# Patient Record
Sex: Female | Born: 1980 | Race: White | Hispanic: No | Smoking: Never smoker
Health system: Southern US, Community
[De-identification: ages and names within clinical notes are randomized; demographics above are authoritative.]

## PROBLEM LIST (undated history)

## (undated) DIAGNOSIS — Z8742 Personal history of other diseases of the female genital tract: Secondary | ICD-10-CM

## (undated) DIAGNOSIS — Z87410 Personal history of cervical dysplasia: Secondary | ICD-10-CM

## (undated) DIAGNOSIS — E119 Type 2 diabetes mellitus without complications: Secondary | ICD-10-CM

## (undated) DIAGNOSIS — N938 Other specified abnormal uterine and vaginal bleeding: Secondary | ICD-10-CM

## (undated) DIAGNOSIS — N8003 Adenomyosis of the uterus: Secondary | ICD-10-CM

## (undated) DIAGNOSIS — Z8639 Personal history of other endocrine, nutritional and metabolic disease: Secondary | ICD-10-CM

## (undated) DIAGNOSIS — R102 Pelvic and perineal pain unspecified side: Secondary | ICD-10-CM

## (undated) DIAGNOSIS — K760 Fatty (change of) liver, not elsewhere classified: Secondary | ICD-10-CM

## (undated) DIAGNOSIS — N946 Dysmenorrhea, unspecified: Secondary | ICD-10-CM

## (undated) DIAGNOSIS — Z973 Presence of spectacles and contact lenses: Secondary | ICD-10-CM

## (undated) DIAGNOSIS — N921 Excessive and frequent menstruation with irregular cycle: Secondary | ICD-10-CM

## (undated) DIAGNOSIS — J302 Other seasonal allergic rhinitis: Secondary | ICD-10-CM

## (undated) DIAGNOSIS — D649 Anemia, unspecified: Secondary | ICD-10-CM

## (undated) HISTORY — DX: Personal history of other endocrine, nutritional and metabolic disease: Z86.39

## (undated) HISTORY — DX: Other seasonal allergic rhinitis: J30.2

## (undated) HISTORY — PX: TUBAL LIGATION: SHX77

## (undated) HISTORY — DX: Fatty (change of) liver, not elsewhere classified: K76.0

## (undated) HISTORY — DX: Anemia, unspecified: D64.9

---

## 2009-12-12 ENCOUNTER — Ambulatory Visit: Payer: Self-pay | Admitting: Obstetrics and Gynecology

## 2009-12-12 ENCOUNTER — Encounter: Payer: Self-pay | Admitting: Obstetrics & Gynecology

## 2009-12-12 LAB — CONVERTED CEMR LAB
Antibody Screen: NEGATIVE
Basophils Relative: 1 % (ref 0–1)
HCT: 40.5 % (ref 36.0–46.0)
Hepatitis B Surface Ag: NEGATIVE
Lymphocytes Relative: 21 % (ref 12–46)
MCHC: 34.6 g/dL (ref 30.0–36.0)
Monocytes Relative: 8 % (ref 3–12)
RBC: 4.56 M/uL (ref 3.87–5.11)
RDW: 13 % (ref 11.5–15.5)
Rh Type: POSITIVE
WBC: 7.7 10*3/uL (ref 4.0–10.5)

## 2009-12-27 ENCOUNTER — Ambulatory Visit (HOSPITAL_COMMUNITY): Admission: RE | Admit: 2009-12-27 | Discharge: 2009-12-27 | Payer: Self-pay | Admitting: Obstetrics and Gynecology

## 2010-01-22 ENCOUNTER — Ambulatory Visit: Payer: Self-pay | Admitting: Obstetrics and Gynecology

## 2010-01-25 ENCOUNTER — Ambulatory Visit (HOSPITAL_COMMUNITY): Admission: RE | Admit: 2010-01-25 | Discharge: 2010-01-25 | Payer: Self-pay | Admitting: Obstetrics & Gynecology

## 2010-02-13 ENCOUNTER — Ambulatory Visit (HOSPITAL_COMMUNITY): Admission: RE | Admit: 2010-02-13 | Discharge: 2010-02-13 | Payer: Self-pay | Admitting: Obstetrics & Gynecology

## 2010-02-19 ENCOUNTER — Ambulatory Visit: Payer: Self-pay | Admitting: Obstetrics & Gynecology

## 2011-02-03 ENCOUNTER — Ambulatory Visit: Payer: Self-pay | Admitting: Family Medicine

## 2011-03-14 ENCOUNTER — Encounter: Payer: Self-pay | Admitting: Family Medicine

## 2011-03-14 ENCOUNTER — Ambulatory Visit (INDEPENDENT_AMBULATORY_CARE_PROVIDER_SITE_OTHER): Payer: Federal, State, Local not specified - PPO | Admitting: Family Medicine

## 2011-03-14 ENCOUNTER — Other Ambulatory Visit: Payer: Self-pay | Admitting: Family Medicine

## 2011-03-14 DIAGNOSIS — Z8742 Personal history of other diseases of the female genital tract: Secondary | ICD-10-CM

## 2011-03-14 DIAGNOSIS — E669 Obesity, unspecified: Secondary | ICD-10-CM | POA: Insufficient documentation

## 2011-03-14 DIAGNOSIS — Z862 Personal history of diseases of the blood and blood-forming organs and certain disorders involving the immune mechanism: Secondary | ICD-10-CM | POA: Insufficient documentation

## 2011-03-14 DIAGNOSIS — E663 Overweight: Secondary | ICD-10-CM

## 2011-03-14 DIAGNOSIS — Z0000 Encounter for general adult medical examination without abnormal findings: Secondary | ICD-10-CM

## 2011-03-14 HISTORY — DX: Personal history of diseases of the blood and blood-forming organs and certain disorders involving the immune mechanism: Z86.2

## 2011-03-14 HISTORY — DX: Personal history of other diseases of the female genital tract: Z87.42

## 2011-03-14 LAB — BASIC METABOLIC PANEL
Calcium: 9 mg/dL (ref 8.4–10.5)
Glucose, Bld: 103 mg/dL — ABNORMAL HIGH (ref 70–99)
Potassium: 4.3 mEq/L (ref 3.5–5.1)

## 2011-03-14 LAB — HEPATIC FUNCTION PANEL
Albumin: 4.4 g/dL (ref 3.5–5.2)
Alkaline Phosphatase: 36 U/L — ABNORMAL LOW (ref 39–117)
Total Bilirubin: 0.8 mg/dL (ref 0.3–1.2)
Total Protein: 7.2 g/dL (ref 6.0–8.3)

## 2011-03-14 LAB — CBC WITH DIFFERENTIAL/PLATELET
Basophils Absolute: 0 10*3/uL (ref 0.0–0.1)
HCT: 40.1 % (ref 36.0–46.0)
Lymphocytes Relative: 29.9 % (ref 12.0–46.0)
Lymphs Abs: 1.6 10*3/uL (ref 0.7–4.0)
Monocytes Absolute: 0.5 10*3/uL (ref 0.1–1.0)
Monocytes Relative: 8.3 % (ref 3.0–12.0)
Neutrophils Relative %: 57.8 % (ref 43.0–77.0)
Platelets: 257 10*3/uL (ref 150.0–400.0)
WBC: 5.5 10*3/uL (ref 4.5–10.5)

## 2011-03-14 LAB — LIPID PANEL
HDL: 44 mg/dL (ref >39.00)
LDL Cholesterol: 116 mg/dL — ABNORMAL HIGH (ref 0–99)

## 2011-03-16 DIAGNOSIS — R740 Nonspecific elevation of levels of transaminase and lactic acid dehydrogenase [LDH]: Secondary | ICD-10-CM

## 2011-03-16 DIAGNOSIS — R7309 Other abnormal glucose: Secondary | ICD-10-CM | POA: Insufficient documentation

## 2011-03-16 DIAGNOSIS — IMO0002 Reserved for concepts with insufficient information to code with codable children: Secondary | ICD-10-CM | POA: Insufficient documentation

## 2011-03-16 DIAGNOSIS — R7401 Elevation of levels of liver transaminase levels: Secondary | ICD-10-CM | POA: Insufficient documentation

## 2011-03-18 NOTE — Assessment & Plan Note (Signed)
Summary: NEW PATIENT TO EST/CPX/CLE  BCBS,MAILED NPP   Vital Signs:  Patient profile:   30 year old female Height:      64 inches Weight:      207.25 pounds BMI:     35.70 Temp:     98.3 degrees F oral Pulse rate:   76 / minute Pulse rhythm:   regular BP sitting:   118 / 78  (left arm) Cuff size:   large  Vitals Entered By: Selena Batten Dance CMA Duncan Dull) (March 14, 2011 9:36 AM) CC: New patient to establish care   History of Present Illness: CC: new patient, establish  wants to lose weight.  currently nursing.  currently trying to eat more healthy - incorporating more raw vegetables and fruits, less butter.  trying to exercise more as well - evenings 3 nights/wk.  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  may have had in hospital, will call and check.   well woman at [facility].  always had normal paps/breast.  Previously saw [facility].  Hasn't had complete physical in 3 years.  Due for one.   last blood work 3 years ago.  Current Medications (verified): 1)  Prenatal/folic Acid  Tabs (Prenatal Vit-Fe Fumarate-Fa) .Marland Kitchen.. 1 By Mouth Once Daily 2)  Calcium Antacid 500 Mg Chew (Calcium Carbonate Antacid) .... Two Daily  Allergies (verified): No Known Drug Allergies  Past History:  Past Medical History: BMI 35 h/o GDM during 2nd pregnancy h/o anemia after 2nd pregnancy h/o chicken pox  Past Surgical History: C/S x 2 (2009, 2011)  Family History: F: DM, HTN, HLD M: HTN, HLD, skin cancer MGF: heart disease, MI PGM: MI  No CVA, other CA  Social History: no smoking, no EtOH, no rec drugs caffeine: none Occupation: stay at home mom Lives with husband and 2 children (2009, 2011), 2 dogs Edu: HS, culinary school  Review of Systems  The patient denies anorexia, fever, weight loss, weight gain, vision loss, decreased hearing, hoarseness, chest pain, syncope, dyspnea on exertion, peripheral edema, prolonged cough, headaches, hemoptysis,  abdominal pain, melena, hematochezia, severe indigestion/heartburn, hematuria, depression, and breast masses.    Physical Exam  General:  Well-developed,well-nourished,in no acute distress; alert,appropriate and cooperative throughout examination Head:  Normocephalic and atraumatic without obvious abnormalities. No apparent alopecia or balding. Eyes:  No corneal or conjunctival inflammation noted. EOMI. Perrla.  Ears:  TMs clear bilaterally Nose:  nares clear bilaterally Mouth:  no pharyngeal erythema/exudates, mmm, good dentition Neck:  No deformities, masses, or tenderness noted.  no LAD Lungs:  Normal respiratory effort, chest expands symmetrically. Lungs are clear to auscultation, no crackles or wheezes.  some distant bs Heart:  Normal rate and regular rhythm. S1 and S2 normal without gallop, murmur, click, rub or other extra sounds. Abdomen:  Bowel sounds positive,abdomen soft and non-tender without masses, organomegaly or hernias noted. Msk:  No deformity or scoliosis noted of thoracic or lumbar spine.   Pulses:  2+ rad pulses, brisk cap refill Extremities:  no pedal edema Neurologic:  CN grossly intact, station and gait intact. Skin:  Intact without suspicious lesions or rashes Psych:  full affect , pleasant and cooperative with exam.   Impression & Recommendations:  Problem # 1:  HEALTH MAINTENANCE EXAM (ICD-V70.0) discussed preventative measures, to call about latest shot at [facility] hospital.  Orders: Venipuncture (16109) TLB-Lipid Panel (80061-LIPID) TLB-BMP (Basic Metabolic Panel-BMET) (80048-METABOL) TLB-CBC Platelet - w/Differential (85025-CBCD) TLB-Hepatic/Liver Function Pnl (80076-HEPATIC) TLB-TSH (Thyroid Stimulating Hormone) (  84443-TSH)  Problem # 2:  OVERWEIGHT (ICD-278.02) Assessment: New discussed healthy eating, incorporating exercise into routine. Orders: TLB-Lipid Panel (80061-LIPID)  Ht: 64 (03/14/2011)   Wt: 207.25 (03/14/2011)   BMI: 35.70  (03/14/2011)  Problem # 3:  DIABETES MELLITUS, GESTATIONAL, HX OF (ICD-V13.29) check BMP.  Problem # 4:  ANEMIA, HX OF (ICD-V12.3) check CBC. Orders: TLB-CBC Platelet - w/Differential (85025-CBCD)  Complete Medication List: 1)  Prenatal/folic Acid Tabs (Prenatal vit-fe fumarate-fa) .Marland Kitchen.. 1 by mouth once daily 2)  Calcium Antacid 500 Mg Chew (Calcium carbonate antacid) .... Two daily  Patient Instructions: 1)  Call [facility] to ask about immunization. 2)  recommend tetanus every 10 years. 3)  Return as needed or in 1-2 years for next physical 4)  good to see you today, call clinic with questions   Orders Added: 1)  Venipuncture [36415] 2)  TLB-Lipid Panel [80061-LIPID] 3)  TLB-BMP (Basic Metabolic Panel-BMET) [80048-METABOL] 4)  TLB-CBC Platelet - w/Differential [85025-CBCD] 5)  TLB-Hepatic/Liver Function Pnl [80076-HEPATIC] 6)  TLB-TSH (Thyroid Stimulating Hormone) [84443-TSH] 7)  New Patient 18-39 years [99385]    Current Allergies (reviewed today): No known allergies

## 2011-09-08 ENCOUNTER — Other Ambulatory Visit: Payer: Federal, State, Local not specified - PPO

## 2011-10-14 ENCOUNTER — Other Ambulatory Visit: Payer: Federal, State, Local not specified - PPO

## 2011-12-30 HISTORY — PX: LAPAROSCOPIC TUBAL LIGATION: SUR803

## 2012-09-10 ENCOUNTER — Ambulatory Visit (INDEPENDENT_AMBULATORY_CARE_PROVIDER_SITE_OTHER): Payer: Federal, State, Local not specified - PPO | Admitting: Medical

## 2012-09-10 ENCOUNTER — Encounter: Payer: Self-pay | Admitting: Medical

## 2012-09-10 VITALS — BP 108/80 | HR 72 | Temp 98.4°F | Resp 16 | Wt 211.0 lb

## 2012-09-10 DIAGNOSIS — F32A Depression, unspecified: Secondary | ICD-10-CM

## 2012-09-10 DIAGNOSIS — F3289 Other specified depressive episodes: Secondary | ICD-10-CM

## 2012-09-10 DIAGNOSIS — F329 Major depressive disorder, single episode, unspecified: Secondary | ICD-10-CM

## 2012-09-10 MED ORDER — SERTRALINE HCL 25 MG PO TABS: ORAL_TABLET | ORAL | Status: DC
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
## 2012-09-10 NOTE — Patient Instructions (Signed)
[facility] [address] [place] [phone]   [facility] [address], [place], [place] [address]  [phone]    [facility] [address] Ginette Otto [phone]

## 2012-09-28 ENCOUNTER — Ambulatory Visit: Payer: Federal, State, Local not specified - PPO | Admitting: Medical

## 2012-10-05 ENCOUNTER — Telehealth: Payer: Self-pay | Admitting: Medical

## 2012-10-05 ENCOUNTER — Ambulatory Visit: Payer: Federal, State, Local not specified - PPO | Admitting: Medical

## 2012-10-05 NOTE — Telephone Encounter (Signed)
LM

## 2012-10-08 ENCOUNTER — Ambulatory Visit (INDEPENDENT_AMBULATORY_CARE_PROVIDER_SITE_OTHER): Payer: Federal, State, Local not specified - PPO | Admitting: Medical

## 2012-10-08 ENCOUNTER — Encounter: Payer: Self-pay | Admitting: Medical

## 2012-10-08 VITALS — BP 120/78 | HR 62 | Wt 214.0 lb

## 2012-10-08 DIAGNOSIS — Z23 Encounter for immunization: Secondary | ICD-10-CM

## 2012-10-08 DIAGNOSIS — F329 Major depressive disorder, single episode, unspecified: Secondary | ICD-10-CM

## 2012-10-08 DIAGNOSIS — F3289 Other specified depressive episodes: Secondary | ICD-10-CM

## 2012-10-08 DIAGNOSIS — F32A Depression, unspecified: Secondary | ICD-10-CM
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
## 2012-12-29 DIAGNOSIS — K760 Fatty (change of) liver, not elsewhere classified: Secondary | ICD-10-CM

## 2012-12-29 HISTORY — DX: Fatty (change of) liver, not elsewhere classified: K76.0

## 2013-01-03 ENCOUNTER — Encounter: Payer: Federal, State, Local not specified - PPO | Admitting: Medical

## 2013-01-21 LAB — COMPREHENSIVE METABOLIC PANEL
AST: 62 U/L
Alkaline Phosphatase: 45 U/L
Creat: 0.73
Glucose: 91
Total Bilirubin: 0.7 mg/dL

## 2013-01-31 ENCOUNTER — Ambulatory Visit (INDEPENDENT_AMBULATORY_CARE_PROVIDER_SITE_OTHER): Payer: Federal, State, Local not specified - PPO | Admitting: Family Medicine

## 2013-01-31 ENCOUNTER — Encounter: Payer: Self-pay | Admitting: Family Medicine

## 2013-01-31 VITALS — BP 124/84 | HR 84 | Temp 98.3°F | Ht 64.0 in | Wt 216.0 lb

## 2013-01-31 DIAGNOSIS — F4320 Adjustment disorder, unspecified: Secondary | ICD-10-CM | POA: Insufficient documentation

## 2013-01-31 DIAGNOSIS — E663 Overweight: Secondary | ICD-10-CM

## 2013-01-31 DIAGNOSIS — R7401 Elevation of levels of liver transaminase levels: Secondary | ICD-10-CM

## 2013-01-31 NOTE — Assessment & Plan Note (Signed)
Discussed healthy diet and lifestyle changes to affect weight loss. Body mass index is 37.08 kg/(m^2).

## 2013-01-31 NOTE — Patient Instructions (Addendum)
Return in 1-2 months for labwork to recheck liver function. Return as needed or in 1 year for physical. Work on diet and try to incorporate exercise into routine - start out at 15 min 3 times a week, goal 30-45 min 5+ days/wk.  Fatty Liver Fatty liver is the accumulation of fat in liver cells. It is also called hepatosteatosis or steatohepatitis. It is normal for your liver to contain some fat. If fat is more than 5 to 10% of your liver's weight, you have fatty liver.  There are often no symptoms (problems) for years while damage is still occurring. People often learn about their fatty liver when they have medical tests for other reasons. Fat can damage your liver for years or even decades without causing problems. When it becomes severe, it can cause fatigue, weight loss, weakness, and confusion. This makes you more likely to develop more serious liver problems. The liver is the largest organ in the body. It does a lot of work and often gives no warning signs when it is sick until late in a disease. The liver has many important jobs including:  Breaking down foods.  Storing vitamins, iron, and other minerals.  Making proteins.  Making bile for food digestion.  Breaking down many products including medications, alcohol and some poisons. CAUSES  There are a number of different conditions, medications, and poisons that can cause a fatty liver. Eating too many calories causes fat to build up in the liver. Not processing and breaking fats down normally may also cause this. Certain conditions, such as obesity, diabetes, and high triglycerides also cause this. Most fatty liver patients tend to be middle-aged and over weight.  Some causes of fatty liver are:  Alcohol over consumption.  Malnutrition.  Steroid use.  Valproic acid toxicity.  Obesity.  Cushing's syndrome.  Poisons.  Tetracycline in high dosages.  Pregnancy.  Diabetes.  Hyperlipidemia.  Rapid weight loss. Some  people develop fatty liver even having none of these conditions. SYMPTOMS  Fatty liver most often causes no problems. This is called asymptomatic.  It can be diagnosed with blood tests and also by a liver biopsy.  It is one of the most common causes of minor elevations of liver enzymes on routine blood tests.  Specialized Imaging of the liver using ultrasound, CT (computed tomography) scan, or MRI (magnetic resonance imaging) can suggest a fatty liver but a biopsy is needed to confirm it.  A biopsy involves taking a small sample of liver tissue. This is done by using a needle. It is then looked at under a microscope by a specialist. TREATMENT  It is important to treat the cause. Simple fatty liver without a medical reason may not need treatment.  Weight loss, fat restriction, and exercise in overweight patients produces inconsistent results but is worth trying.  Fatty liver due to alcohol toxicity may not improve even with stopping drinking.  Good control of diabetes may reduce fatty liver.  Lower your triglycerides through diet, medication or both.  Eat a balanced, healthy diet.  Increase your physical activity.  Get regular checkups from a liver specialist.  There are no medical or surgical treatments for a fatty liver or NASH, but improving your diet and increasing your exercise may help prevent or reverse some of the damage. PROGNOSIS  Fatty liver may cause no damage or it can lead to an inflammation of the liver. This is, called steatohepatitis. When it is linked to alcohol abuse, it is called alcoholic steatohepatitis.  It often is not linked to alcohol. It is then called nonalcoholic steatohepatitis, or NASH. Over time the liver may become scarred and hardened. This condition is called cirrhosis. Cirrhosis is serious and may lead to liver failure or cancer. NASH is one of the leading causes of cirrhosis. About 10-20% of Americans have fatty liver and a smaller 2-5% has  NASH. Document Released: 01/30/2006 Document Revised: 03/08/2012 Document Reviewed: 03/25/2006 [facility] Patient Information 2013 [place], [place].

## 2013-01-31 NOTE — Assessment & Plan Note (Signed)
Self tapered off zoloft. Feels stable off this med. Continues to f/u with counselor.

## 2013-01-31 NOTE — Progress Notes (Signed)
  Subjective:    Patient ID: Linda Carlson, female    DOB: 04-Jun-1981, 32 y.o.   MRN: [id number]  HPI CC: transaminitis  Seen 01/21/2013 at [facility] for acute visit with HA, dizziness, nausea/vomiting with LLQ abd pain, dx with sinusitis, ear infection and vertigo/neuritis after URI - records reviewed.  Found to have transaminitis with AST 62, ALT 134, CT abd/pelvis with hepatomegaly suggestive of fatty liver.  Treated with 5 different meds.  Treated with phenergan, meclizine, flonase and 2 antibiotics.  Feeling better, still occasional nausea with HA and significant anxiety.  No EtOH, rare tylenol.  advil 600mg  about 2-3 x/wk.  No fmhx liver trouble.  EKG WNL  HA - treating with advil as needed.  Was drinking significant amount of caffeine.  Cutting back.  Seem to be slowly improving.  Wt Readings from Last 3 Encounters:  01/31/13 216 lb (97.977 kg)  10/08/12 214 lb (97.07 kg)  09/10/12 211 lb (95.709 kg)  has recently changed diet.  Eliminating all meat.  Eating more seeds (chia and flax), soy flour. Activity - in apartment - access to gym.  Busy with daughters.  2 yo and 6 yo.    Recent stressors - separated from husband 08/2012. Seeing psychiatrist - was on zoloft 50mg .  Pt has self tapered off this.  Feels depressed but doesn't feel overwhelmed.  Sees psychologist for counseling q2 wks.  Past Medical History  Diagnosis Date  . Seasonal allergic rhinitis   . Anemia     Review of Systems Per HPI    Objective:   Physical Exam  Nursing note and vitals reviewed. Constitutional: She appears well-developed and well-nourished. No distress.  HENT:  Head: Normocephalic and atraumatic.  Mouth/Throat: Oropharynx is clear and moist. No oropharyngeal exudate.  Neck: Normal range of motion. Neck supple.  Cardiovascular: Normal rate, regular rhythm, normal heart sounds and intact distal pulses.   No murmur heard. Pulmonary/Chest: Effort normal and breath sounds normal. No respiratory  distress. She has no wheezes. She has no rales.  Abdominal: Soft. Bowel sounds are normal. She exhibits no distension and no mass. There is no hepatosplenomegaly. There is no tenderness. There is no rebound and no guarding.  Musculoskeletal: She exhibits no edema.  Skin: Skin is warm and dry. No rash noted.       Assessment & Plan:

## 2013-01-31 NOTE — Assessment & Plan Note (Signed)
Discussed fatty liver. Chol levels adequate as of last check. Check viral hep panel, tsh and iron panel at next lab visit

## 2013-02-02 ENCOUNTER — Encounter: Payer: Self-pay | Admitting: Family Medicine

## 2013-03-29 ENCOUNTER — Other Ambulatory Visit: Payer: Federal, State, Local not specified - PPO

## 2013-04-18 ENCOUNTER — Other Ambulatory Visit (INDEPENDENT_AMBULATORY_CARE_PROVIDER_SITE_OTHER): Payer: Federal, State, Local not specified - PPO

## 2013-04-18 DIAGNOSIS — R7401 Elevation of levels of liver transaminase levels: Secondary | ICD-10-CM

## 2013-04-18 LAB — COMPREHENSIVE METABOLIC PANEL
AST: 47 U/L — ABNORMAL HIGH (ref 0–37)
Albumin: 4.1 g/dL (ref 3.5–5.2)
Alkaline Phosphatase: 31 U/L — ABNORMAL LOW (ref 39–117)
BUN: 13 mg/dL (ref 6–23)
GFR: 115.39 mL/min (ref >60.00)
Potassium: 3.9 mEq/L (ref 3.5–5.1)
Sodium: 136 mEq/L (ref 135–145)
Total Bilirubin: 1 mg/dL (ref 0.3–1.2)
Total Protein: 7.4 g/dL (ref 6.0–8.3)

## 2013-04-18 LAB — IBC PANEL
Saturation Ratios: 40.6 % (ref 20.0–50.0)
Transferrin: 281.3 mg/dL (ref 212.0–360.0)

## 2013-04-18 LAB — TSH: TSH: 0.36 u[IU]/mL (ref 0.35–5.50)

## 2013-04-19 LAB — HEPATITIS PANEL, ACUTE
HCV Ab: NEGATIVE
Hepatitis B Surface Ag: NEGATIVE

## 2013-04-20 ENCOUNTER — Other Ambulatory Visit: Payer: Self-pay | Admitting: Family Medicine

## 2013-04-20 DIAGNOSIS — R7402 Elevation of levels of lactic acid dehydrogenase (LDH): Secondary | ICD-10-CM

## 2013-04-20 DIAGNOSIS — R7401 Elevation of levels of liver transaminase levels: Secondary | ICD-10-CM

## 2013-04-27 ENCOUNTER — Other Ambulatory Visit: Payer: Federal, State, Local not specified - PPO

## 2013-04-28 ENCOUNTER — Ambulatory Visit
Admission: RE | Admit: 2013-04-28 | Discharge: 2013-04-28 | Disposition: A | Discharge status: Home / Self Care | Payer: Federal, State, Local not specified - PPO | Source: Ambulatory Visit | Attending: Family Medicine | Admitting: Family Medicine

## 2013-04-28 DIAGNOSIS — R7401 Elevation of levels of liver transaminase levels: Secondary | ICD-10-CM

## 2013-05-18 ENCOUNTER — Other Ambulatory Visit (INDEPENDENT_AMBULATORY_CARE_PROVIDER_SITE_OTHER): Payer: Federal, State, Local not specified - PPO

## 2013-05-18 DIAGNOSIS — R7401 Elevation of levels of liver transaminase levels: Secondary | ICD-10-CM

## 2013-05-18 LAB — COMPREHENSIVE METABOLIC PANEL
ALT: 96 U/L — ABNORMAL HIGH (ref 0–35)
AST: 54 U/L — ABNORMAL HIGH (ref 0–37)
Alkaline Phosphatase: 30 U/L — ABNORMAL LOW (ref 39–117)
CO2: 21 mEq/L (ref 19–32)
Calcium: 9 mg/dL (ref 8.4–10.5)
GFR: 94.31 mL/min (ref >60.00)
Total Bilirubin: 0.8 mg/dL (ref 0.3–1.2)
Total Protein: 7.4 g/dL (ref 6.0–8.3)

## 2013-06-01 ENCOUNTER — Encounter: Payer: Self-pay | Admitting: *Deleted

## 2013-11-03 ENCOUNTER — Other Ambulatory Visit: Payer: Self-pay

## 2019-10-01 DIAGNOSIS — H04123 Dry eye syndrome of bilateral lacrimal glands: Secondary | ICD-10-CM | POA: Diagnosis not present

## 2019-10-01 DIAGNOSIS — H40033 Anatomical narrow angle, bilateral: Secondary | ICD-10-CM | POA: Diagnosis not present

## 2020-03-20 DIAGNOSIS — R300 Dysuria: Secondary | ICD-10-CM | POA: Diagnosis not present

## 2020-03-20 DIAGNOSIS — R030 Elevated blood-pressure reading, without diagnosis of hypertension: Secondary | ICD-10-CM | POA: Diagnosis not present

## 2020-03-20 DIAGNOSIS — R319 Hematuria, unspecified: Secondary | ICD-10-CM | POA: Diagnosis not present

## 2020-03-20 DIAGNOSIS — N390 Urinary tract infection, site not specified: Secondary | ICD-10-CM | POA: Diagnosis not present

## 2020-03-29 DIAGNOSIS — U071 COVID-19: Secondary | ICD-10-CM

## 2020-03-29 HISTORY — DX: COVID-19: U07.1

## 2020-04-30 ENCOUNTER — Encounter (HOSPITAL_COMMUNITY): Payer: Self-pay

## 2020-04-30 ENCOUNTER — Other Ambulatory Visit: Payer: Self-pay

## 2020-04-30 ENCOUNTER — Emergency Department (HOSPITAL_COMMUNITY)
Admission: EM | Admit: 2020-04-30 | Discharge: 2020-05-01 | Disposition: A | Discharge status: Home / Self Care | Payer: Federal, State, Local not specified - PPO | Attending: Emergency Medicine | Admitting: Emergency Medicine

## 2020-04-30 DIAGNOSIS — Z5321 Procedure and treatment not carried out due to patient leaving prior to being seen by health care provider: Secondary | ICD-10-CM | POA: Diagnosis not present

## 2020-04-30 DIAGNOSIS — U071 COVID-19: Secondary | ICD-10-CM | POA: Insufficient documentation

## 2020-04-30 DIAGNOSIS — R05 Cough: Secondary | ICD-10-CM | POA: Diagnosis not present

## 2020-04-30 LAB — URINALYSIS, ROUTINE W REFLEX MICROSCOPIC
Bacteria, UA: NONE SEEN
Bilirubin Urine: NEGATIVE
Glucose, UA: >=500 mg/dL — AB
Hgb urine dipstick: NEGATIVE
Ketones, ur: 80 mg/dL — AB
Leukocytes,Ua: NEGATIVE
Nitrite: NEGATIVE
Protein, ur: 100 mg/dL — AB
Specific Gravity, Urine: 1.042 — ABNORMAL HIGH (ref 1.005–1.030)
pH: 5 (ref 5.0–8.0)

## 2020-04-30 LAB — POC SARS CORONAVIRUS 2 AG -  ED: SARS Coronavirus 2 Ag: POSITIVE — AB

## 2020-04-30 MED ORDER — SODIUM CHLORIDE 0.9% FLUSH: 3.0000 mL | Freq: Once | INTRAVENOUS | Status: DC

## 2020-04-30 NOTE — ED Triage Notes (Addendum)
Pt arrives to ED w/ c/o cough, sob, fever, 8/10 abdominal pain, nausea, diarrhea and fatigue. Pt states there is a possibility of covid exposure.

## 2020-04-30 NOTE — ED Notes (Signed)
Pt states that she does not want to wait, this NT explained this is leaving AMA, pt stated understanding.

## 2020-05-01 LAB — CBC
HCT: 44.5 % (ref 36.0–46.0)
Hemoglobin: 15.4 g/dL — ABNORMAL HIGH (ref 12.0–15.0)
MCH: 30.7 pg (ref 26.0–34.0)
MCHC: 34.6 g/dL (ref 30.0–36.0)
MCV: 88.6 fL (ref 80.0–100.0)
Platelets: 192 10*3/uL (ref 150–400)
RBC: 5.02 MIL/uL (ref 3.87–5.11)
RDW: 11.8 % (ref 11.5–15.5)
WBC: 5.2 10*3/uL (ref 4.0–10.5)
nRBC: 0 % (ref 0.0–0.2)

## 2020-05-01 LAB — COMPREHENSIVE METABOLIC PANEL
ALT: 100 U/L — ABNORMAL HIGH (ref 0–44)
AST: 73 U/L — ABNORMAL HIGH (ref 15–41)
Albumin: 3.6 g/dL (ref 3.5–5.0)
Alkaline Phosphatase: 44 U/L (ref 38–126)
Anion gap: 18 — ABNORMAL HIGH (ref 5–15)
BUN: 8 mg/dL (ref 6–20)
CO2: 20 mmol/L — ABNORMAL LOW (ref 22–32)
Calcium: 8.9 mg/dL (ref 8.9–10.3)
Chloride: 100 mmol/L (ref 98–111)
Creatinine, Ser: 0.58 mg/dL (ref 0.44–1.00)
GFR calc Af Amer: >60 mL/min (ref >60)
GFR calc non Af Amer: >60 mL/min (ref >60)
Glucose, Bld: 289 mg/dL — ABNORMAL HIGH (ref 70–99)
Potassium: 3.3 mmol/L — ABNORMAL LOW (ref 3.5–5.1)
Sodium: 138 mmol/L (ref 135–145)
Total Bilirubin: 1.1 mg/dL (ref 0.3–1.2)
Total Protein: 8.1 g/dL (ref 6.5–8.1)

## 2020-05-01 LAB — LIPASE, BLOOD: Lipase: 23 U/L (ref 11–51)

## 2020-05-01 NOTE — ED Notes (Signed)
Attempted to call patient regarding COVID results. Pt was [facility]. No answer

## 2020-05-12 ENCOUNTER — Encounter: Payer: Self-pay | Admitting: Family Medicine

## 2020-05-14 ENCOUNTER — Telehealth: Payer: Self-pay | Admitting: Family Medicine

## 2020-05-14 NOTE — Telephone Encounter (Signed)
Lvm asking pt to call back.  See Dr. G's message below. °

## 2020-05-14 NOTE — Telephone Encounter (Signed)
Pt last seen here 2014, did not return for f/u.  She was seen at ER early May, left [facility]. Tested positive for COVID-19.  plz call for update, ensure COVID symptoms fully resolved, offer in-office visit with myself or another provider to re establish if desired.

## 2020-05-15 NOTE — Telephone Encounter (Signed)
Lvm asking pt to call back.  See Dr. G's message below. °

## 2020-05-16 NOTE — Telephone Encounter (Signed)
Lvm asking pt to call back.  See Dr. Timoteo Expose message below.  Mailing a letter.

## 2020-07-16 DIAGNOSIS — R739 Hyperglycemia, unspecified: Secondary | ICD-10-CM | POA: Diagnosis not present

## 2020-07-16 DIAGNOSIS — R350 Frequency of micturition: Secondary | ICD-10-CM | POA: Diagnosis not present

## 2020-07-17 DIAGNOSIS — M549 Dorsalgia, unspecified: Secondary | ICD-10-CM | POA: Diagnosis not present

## 2020-07-17 DIAGNOSIS — Z7984 Long term (current) use of oral hypoglycemic drugs: Secondary | ICD-10-CM | POA: Diagnosis not present

## 2020-07-17 DIAGNOSIS — N390 Urinary tract infection, site not specified: Secondary | ICD-10-CM | POA: Diagnosis not present

## 2020-07-17 DIAGNOSIS — B379 Candidiasis, unspecified: Secondary | ICD-10-CM | POA: Diagnosis not present

## 2020-07-17 DIAGNOSIS — Z833 Family history of diabetes mellitus: Secondary | ICD-10-CM | POA: Diagnosis not present

## 2020-07-17 DIAGNOSIS — E119 Type 2 diabetes mellitus without complications: Secondary | ICD-10-CM | POA: Diagnosis not present

## 2020-07-17 DIAGNOSIS — Z20822 Contact with and (suspected) exposure to covid-19: Secondary | ICD-10-CM | POA: Diagnosis not present

## 2020-07-17 LAB — CBC AND DIFFERENTIAL
Hemoglobin: 15.1 (ref 12.0–16.0)
Platelets: 312 (ref 150–399)
WBC: 6

## 2020-07-17 LAB — HEPATIC FUNCTION PANEL
ALT: 59 — AB (ref 7–35)
AST: 27 (ref 13–35)
Alkaline Phosphatase: 40 (ref 25–125)
Bilirubin, Total: 0.5

## 2020-07-17 LAB — COMPREHENSIVE METABOLIC PANEL
Albumin: 4.8 (ref 3.5–5.0)
Calcium: 10.1 (ref 8.7–10.7)

## 2020-07-17 LAB — BASIC METABOLIC PANEL
Creatinine: 0.5 (ref 0.5–1.1)
Glucose: 358
Potassium: 4 (ref 3.4–5.3)

## 2020-07-17 LAB — HEMOGLOBIN A1C: Hemoglobin A1C: 11.4

## 2020-07-26 ENCOUNTER — Ambulatory Visit: Payer: Federal, State, Local not specified - PPO | Admitting: Family Medicine

## 2020-07-26 ENCOUNTER — Other Ambulatory Visit: Payer: Self-pay

## 2020-07-26 ENCOUNTER — Encounter: Payer: Self-pay | Admitting: Family Medicine

## 2020-07-26 VITALS — BP 144/84 | HR 100 | Temp 97.9°F | Ht 64.5 in | Wt 169.1 lb

## 2020-07-26 DIAGNOSIS — R7402 Elevation of levels of lactic acid dehydrogenase (LDH): Secondary | ICD-10-CM | POA: Diagnosis not present

## 2020-07-26 DIAGNOSIS — E118 Type 2 diabetes mellitus with unspecified complications: Secondary | ICD-10-CM | POA: Diagnosis not present

## 2020-07-26 DIAGNOSIS — E1165 Type 2 diabetes mellitus with hyperglycemia: Secondary | ICD-10-CM

## 2020-07-26 DIAGNOSIS — R7401 Elevation of levels of liver transaminase levels: Secondary | ICD-10-CM | POA: Diagnosis not present

## 2020-07-26 DIAGNOSIS — IMO0002 Reserved for concepts with insufficient information to code with codable children: Secondary | ICD-10-CM

## 2020-07-26 MED ORDER — GLIMEPIRIDE 1 MG PO TABS
1.0000 mg | ORAL_TABLET | Freq: Every day | ORAL | 1 refills | Status: DC
Start: 2020-07-26 — End: 2023-01-02

## 2020-07-26 MED ORDER — METFORMIN HCL 500 MG PO TABS: 500.0000 mg | ORAL_TABLET | Freq: Two times a day (BID) | ORAL | 1 refills | Status: DC

## 2020-07-26 NOTE — Patient Instructions (Addendum)
Sugars were in uncontrolled diabetes range (A1c 11.4%). Goal A1c <7%. We will refer you to diabetes classes at [facility]. Work on low sugar low carb diabetic diet.  Check on glucose monitor preferred by your insurance and let me know to send in for you.  Goal fasting sugar is 80-120. Goal sugar 2 hours after a meal is <180.  Too low sugars <70.  Start glimepiride 1mg  daily with breakfast - helps stimulate pancreas to produce more insulin.   Diabetes Mellitus and Nutrition, Adult When you have diabetes (diabetes mellitus), it is very important to have healthy eating habits because your blood sugar (glucose) levels are greatly affected by what you eat and drink. Eating healthy foods in the appropriate amounts, at about the same times every day, can help you:  Control your blood glucose.  Lower your risk of heart disease.  Improve your blood pressure.  Reach or maintain a healthy weight. Every person with diabetes is different, and each person has different needs for a meal plan. Your health care provider may recommend that you work with a diet and nutrition specialist (dietitian) to make a meal plan that is best for you. Your meal plan may vary depending on factors such as:  The calories you need.  The medicines you take.  Your weight.  Your blood glucose, blood pressure, and cholesterol levels.  Your activity level.  Other health conditions you have, such as heart or kidney disease. How do carbohydrates affect me? Carbohydrates, also called carbs, affect your blood glucose level more than any other type of food. Eating carbs naturally raises the amount of glucose in your blood. Carb counting is a method for keeping track of how many carbs you eat. Counting carbs is important to keep your blood glucose at a healthy level, especially if you use insulin or take certain oral diabetes medicines. It is important to know how many carbs you can safely have in each meal. This is different for every  person. Your dietitian can help you calculate how many carbs you should have at each meal and for each snack. Foods that contain carbs include:  Bread, cereal, rice, pasta, and crackers.  Potatoes and corn.  Peas, beans, and lentils.  Milk and yogurt.  Fruit and juice.  Desserts, such as cakes, cookies, ice cream, and candy. How does alcohol affect me? Alcohol can cause a sudden decrease in blood glucose (hypoglycemia), especially if you use insulin or take certain oral diabetes medicines. Hypoglycemia can be a life-threatening condition. Symptoms of hypoglycemia (sleepiness, dizziness, and confusion) are similar to symptoms of having too much alcohol. If your health care provider says that alcohol is safe for you, follow these guidelines:  Limit alcohol intake to no more than 1 drink per day for nonpregnant women and 2 drinks per day for men. One drink equals 12 oz of beer, 5 oz of wine, or 1 oz of hard liquor.  Do not drink on an empty stomach.  Keep yourself hydrated with water, diet soda, or unsweetened iced tea.  Keep in mind that regular soda, juice, and other mixers may contain a lot of sugar and must be counted as carbs. What are tips for following this plan?  Reading food labels  Start by checking the serving size on the "Nutrition Facts" label of packaged foods and drinks. The amount of calories, carbs, fats, and other nutrients listed on the label is based on one serving of the item. Many items contain more than one serving  per package.  Check the total grams (g) of carbs in one serving. You can calculate the number of servings of carbs in one serving by dividing the total carbs by 15. For example, if a food has 30 g of total carbs, it would be equal to 2 servings of carbs.  Check the number of grams (g) of saturated and trans fats in one serving. Choose foods that have low or no amount of these fats.  Check the number of milligrams (mg) of salt (sodium) in one serving.  Most people should limit total sodium intake to less than 2,300 mg per day.  Always check the nutrition information of foods labeled as "low-fat" or "nonfat". These foods may be higher in added sugar or refined carbs and should be avoided.  Talk to your dietitian to identify your daily goals for nutrients listed on the label. Shopping  Avoid buying canned, premade, or processed foods. These foods tend to be high in fat, sodium, and added sugar.  Shop around the outside edge of the grocery store. This includes fresh fruits and vegetables, bulk grains, fresh meats, and fresh dairy. Cooking  Use low-heat cooking methods, such as baking, instead of high-heat cooking methods like deep frying.  Cook using healthy oils, such as olive, canola, or sunflower oil.  Avoid cooking with butter, cream, or high-fat meats. Meal planning  Eat meals and snacks regularly, preferably at the same times every day. Avoid going long periods of time without eating.  Eat foods high in fiber, such as fresh fruits, vegetables, beans, and whole grains. Talk to your dietitian about how many servings of carbs you can eat at each meal.  Eat 4-6 ounces (oz) of lean protein each day, such as lean meat, chicken, fish, eggs, or tofu. One oz of lean protein is equal to: ? 1 oz of meat, chicken, or fish. ? 1 egg. ?  cup of tofu.  Eat some foods each day that contain healthy fats, such as avocado, nuts, seeds, and fish. Lifestyle  Check your blood glucose regularly.  Exercise regularly as told by your health care provider. This may include: ? 150 minutes of moderate-intensity or vigorous-intensity exercise each week. This could be brisk walking, biking, or water aerobics. ? Stretching and doing strength exercises, such as yoga or weightlifting, at least 2 times a week.  Take medicines as told by your health care provider.  Do not use any products that contain nicotine or tobacco, such as cigarettes and e-cigarettes.  If you need help quitting, ask your health care provider.  Work with a Social worker or diabetes educator to identify strategies to manage stress and any emotional and social challenges. Questions to ask a health care provider  Do I need to meet with a diabetes educator?  Do I need to meet with a dietitian?  What number can I call if I have questions?  When are the best times to check my blood glucose? Where to find more information:  American Diabetes Association: diabetes.org  Academy of Nutrition and Dietetics: www.eatright.[employer] of Diabetes and Digestive and Kidney Diseases (NIH): [employer] Summary  A healthy meal plan will help you control your blood glucose and maintain a healthy lifestyle.  Working with a diet and nutrition specialist (dietitian) can help you make a meal plan that is best for you.  Keep in mind that carbohydrates (carbs) and alcohol have immediate effects on your blood glucose levels. It is important to count carbs and to use  alcohol carefully. This information is not intended to replace advice given to you by your health care provider. Make sure you discuss any questions you have with your health care provider. Document Revised: 11/27/2017 Document Reviewed: 01/19/2017 Elsevier Patient Education  2020 [employer].

## 2020-07-26 NOTE — Assessment & Plan Note (Signed)
New diagnosis based on recent sugar and A1c at [facility] ER.  Anticipate T2DM. Reviewed pathophysiology fo diabetes. Reviewed treatment recommendations including goal sugar. Will also refer to diabetes education. I asked her to check on preferred glucometer brand. Metformin has been started. Discussed slow titration. Will also add glimepiride 1mg  daily with breakfast. RTC 1 mo close f/u visit.

## 2020-07-26 NOTE — Progress Notes (Signed)
This visit was conducted in person.  BP (!) 144/84 (BP Location: Left Arm, Patient Position: Sitting, Cuff Size: Normal)   Pulse 100   Temp 97.9 F (36.6 C) (Temporal)   Ht 5' 4.5" (1.638 m)   Wt 169 lb 2 oz (76.7 kg)   LMP 07/23/2020   SpO2 99%   BMI 28.58 kg/m   BP 144/90 on repeat CC: re establish care  Subjective:    Patient ID: Linda Carlson, female    DOB: 1980/12/31, 39 y.o.   MRN: [id number]  HPI: Linda Carlson is a 39 y.o. female presenting on 07/26/2020 for [place] (f/u on ER from [facility])   Last seen 2014.   Recent visit at [facility] ER 07/17/2020 with UTI sxs, found to have markedly high sugars with A1c 11.4%. UA suspicious for infection, culture results unavailable, treated with macrobid 5d abx course. Labs showed WBC 6, Cr 0.5, transaminitis with elevated lipase to 105.   New DM diagnosis - does not regularly check sugars. Compliant with antihyperglycemic regimen which includes: metformin 549m twice daily. Denies low sugars or hypoglycemic symptoms. Occasional paresthesias. Last diabetic eye exam a few months ago. Pneumovax: DUE. Prevnar: not due. Glucometer brand: does not have. DSME: referred today. No results found for: HGBA1C Diabetic Foot Exam - Simple   Simple Foot Form Diabetic Foot exam was performed with the following findings: Yes 07/26/2020  9:26 AM  Visual Inspection No deformities, no ulcerations, no other skin breakdown bilaterally: Yes Sensation Testing Intact to touch and monofilament testing bilaterally: Yes Pulse Check Posterior Tibialis and Dorsalis pulse intact bilaterally: Yes Comments    No results found for: MICROALBUR, MALB24HUR  Started on metformin 5043mbid - only able to tolerate once daily due to nausea.   H/o gestational diabetes. fmxh DM.   Tested positive for COVID 04/2020 at ER, left without being seen. Symptoms resolved however she has leg/foot pains and leg cramping since COVID infection. Has been  treating with compression stockings.      Relevant past medical, surgical, family and social history reviewed and updated as indicated. Interim medical history since our last visit reviewed. Allergies and medications reviewed and updated. Outpatient Medications Prior to Visit  Medication Sig Dispense Refill  . Multiple Vitamins-Minerals (MULTIVITAMIN PO) Take 1 tablet by mouth daily.    . metFORMIN (GLUCOPHAGE) 500 MG tablet Take 500 mg by mouth 2 (two) times daily.     No facility-administered medications prior to visit.     Per HPI unless specifically indicated in ROS section below Review of Systems Objective:  BP (!) 144/84 (BP Location: Left Arm, Patient Position: Sitting, Cuff Size: Normal)   Pulse 100   Temp 97.9 F (36.6 C) (Temporal)   Ht 5' 4.5" (1.638 m)   Wt 169 lb 2 oz (76.7 kg)   LMP 07/23/2020   SpO2 99%   BMI 28.58 kg/m   Wt Readings from Last 3 Encounters:  07/26/20 169 lb 2 oz (76.7 kg)  04/30/20 170 lb (77.1 kg)  01/31/13 216 lb (98 kg)      Physical Exam Vitals and nursing note reviewed.  Constitutional:      General: She is not in acute distress.    Appearance: She is well-developed.  HENT:     Head: Normocephalic and atraumatic.     Right Ear: External ear normal.     Left Ear: External ear normal.     Nose: Nose normal.     Mouth/Throat:  Pharynx: No oropharyngeal exudate.  Eyes:     General: No scleral icterus.    Conjunctiva/sclera: Conjunctivae normal.     Pupils: Pupils are equal, round, and reactive to light.  Cardiovascular:     Rate and Rhythm: Normal rate and regular rhythm.     Heart sounds: Normal heart sounds. No murmur heard.   Pulmonary:     Effort: Pulmonary effort is normal. No respiratory distress.     Breath sounds: Normal breath sounds. No wheezing or rales.  Musculoskeletal:     Cervical back: Normal range of motion and neck supple.     Comments: See HPI for foot exam if done  Lymphadenopathy:     Cervical: No  cervical adenopathy.  Skin:    General: Skin is warm and dry.     Findings: No rash.       Results for orders placed or performed during the hospital encounter of 04/30/20  Lipase, blood  Result Value Ref Range   Lipase 23 11 - 51 U/L  Comprehensive metabolic panel  Result Value Ref Range   Sodium 138 135 - 145 mmol/L   Potassium 3.3 (L) 3.5 - 5.1 mmol/L   Chloride 100 98 - 111 mmol/L   CO2 20 (L) 22 - 32 mmol/L   Glucose, Bld 289 (H) 70 - 99 mg/dL   BUN 8 6 - 20 mg/dL   Creatinine, Ser 0.58 0.44 - 1.00 mg/dL   Calcium 8.9 8.9 - 10.3 mg/dL   Total Protein 8.1 6.5 - 8.1 g/dL   Albumin 3.6 3.5 - 5.0 g/dL   AST 73 (H) 15 - 41 U/L   ALT 100 (H) 0 - 44 U/L   Alkaline Phosphatase 44 38 - 126 U/L   Total Bilirubin 1.1 0.3 - 1.2 mg/dL   GFR calc non Af Amer >60 >60 mL/min   GFR calc Af Amer >60 >60 mL/min   Anion gap 18 (H) 5 - 15  CBC  Result Value Ref Range   WBC 5.2 4.0 - 10.5 K/uL   RBC 5.02 3.87 - 5.11 MIL/uL   Hemoglobin 15.4 (H) 12.0 - 15.0 g/dL   HCT 44.5 36 - 46 %   MCV 88.6 80.0 - 100.0 fL   MCH 30.7 26.0 - 34.0 pg   MCHC 34.6 30.0 - 36.0 g/dL   RDW 11.8 11.5 - 15.5 %   Platelets 192 150 - 400 K/uL   nRBC 0.0 0.0 - 0.2 %  Urinalysis, Routine w reflex microscopic  Result Value Ref Range   Color, Urine YELLOW YELLOW   APPearance CLEAR CLEAR   Specific Gravity, Urine 1.042 (H) 1.005 - 1.030   pH 5.0 5.0 - 8.0   Glucose, UA >=500 (A) NEGATIVE mg/dL   Hgb urine dipstick NEGATIVE NEGATIVE   Bilirubin Urine NEGATIVE NEGATIVE   Ketones, ur 80 (A) NEGATIVE mg/dL   Protein, ur 100 (A) NEGATIVE mg/dL   Nitrite NEGATIVE NEGATIVE   Leukocytes,Ua NEGATIVE NEGATIVE   RBC / HPF 0-5 0 - 5 RBC/hpf   WBC, UA 6-10 0 - 5 WBC/hpf   Bacteria, UA NONE SEEN NONE SEEN   Squamous Epithelial / LPF 0-5 0 - 5   Mucus PRESENT   POC SARS Coronavirus 2 Ag-ED - Nasal Swab (BD Veritor Kit)  Result Value Ref Range   SARS Coronavirus 2 Ag POSITIVE (A) NEGATIVE   Assessment & Plan:  This  visit occurred during the SARS-CoV-2 public health emergency.  Safety protocols were in place,  including screening questions prior to the visit, additional usage of staff PPE, and extensive cleaning of exam room while observing appropriate contact time as indicated for disinfecting solutions.   Problem List Items Addressed This Visit    TRANSAMINASES, SERUM, ELEVATED    Chronic, anticipate fatty liver related.  Will continue working towards better glycemic control.       Diabetes mellitus type 2, uncontrolled, with complications ([place]) - Primary    New diagnosis based on recent sugar and A1c at [facility] ER.  Anticipate T2DM. Reviewed pathophysiology fo diabetes. Reviewed treatment recommendations including goal sugar. Will also refer to diabetes education. I asked her to check on preferred glucometer brand. Metformin has been started. Discussed slow titration. Will also add glimepiride 74m daily with breakfast. RTC 1 mo close f/u visit.       Relevant Medications   metFORMIN (GLUCOPHAGE) 500 MG tablet   glimepiride (AMARYL) 1 MG tablet   Other Relevant Orders   Ambulatory referral to diabetic education       Meds ordered this encounter  Medications  . metFORMIN (GLUCOPHAGE) 500 MG tablet    Sig: Take 1 tablet (500 mg total) by mouth 2 (two) times daily.    Dispense:  180 tablet    Refill:  1  . glimepiride (AMARYL) 1 MG tablet    Sig: Take 1 tablet (1 mg total) by mouth daily with breakfast.    Dispense:  90 tablet    Refill:  1   Orders Placed This Encounter  Procedures  . Ambulatory referral to diabetic education    Referral Priority:   Routine    Referral Type:   Consultation    Referral Reason:   Specialty Services Required    Number of Visits Requested:   1    Patient instructions: Sugars were in uncontrolled diabetes range (A1c 11.4%). Goal A1c <7%. [facility] Work on low sugar low carb diabetic diet.  Check on glucose monitor  preferred by your insurance and let me know to send in for you.  Goal fasting sugar is 80-120. Goal sugar 2 hours after a meal is <180.  Too low sugars <70.  Start glimepiride 147mdaily with breakfast - helps stimulate pancreas to produce more insulin.   Follow up plan: Return in about 4 weeks (around 08/23/2020) for follow up visit.  JaRia BushMD

## 2020-07-26 NOTE — Assessment & Plan Note (Addendum)
Chronic, anticipate fatty liver related.  Will continue working towards better glycemic control.

## 2020-07-30 ENCOUNTER — Encounter: Payer: Self-pay | Admitting: Family Medicine

## 2020-08-12 DIAGNOSIS — Z03818 Encounter for observation for suspected exposure to other biological agents ruled out: Secondary | ICD-10-CM | POA: Diagnosis not present

## 2020-08-12 DIAGNOSIS — Z20822 Contact with and (suspected) exposure to covid-19: Secondary | ICD-10-CM | POA: Diagnosis not present

## 2020-08-27 ENCOUNTER — Ambulatory Visit
Admission: EM | Admit: 2020-08-27 | Discharge: 2020-08-27 | Disposition: A | Discharge status: Home / Self Care | Payer: Federal, State, Local not specified - PPO | Attending: Family Medicine | Admitting: Family Medicine

## 2020-08-27 ENCOUNTER — Other Ambulatory Visit: Payer: Self-pay

## 2020-08-27 DIAGNOSIS — N3001 Acute cystitis with hematuria: Secondary | ICD-10-CM | POA: Diagnosis not present

## 2020-08-27 HISTORY — DX: Type 2 diabetes mellitus without complications: E11.9

## 2020-08-27 LAB — POCT FASTING CBG KUC MANUAL ENTRY: POCT Glucose (KUC): 238 mg/dL — AB (ref 70–99)

## 2020-08-27 LAB — POCT URINALYSIS DIP (MANUAL ENTRY)
Bilirubin, UA: NEGATIVE
Glucose, UA: >=1000 mg/dL — AB
Nitrite, UA: POSITIVE — AB
Protein Ur, POC: 100 mg/dL — AB
Spec Grav, UA: >=1.03 — AB (ref 1.010–1.025)
Urobilinogen, UA: 0.2 E.U./dL
pH, UA: 5.5 (ref 5.0–8.0)

## 2020-08-27 MED ORDER — SULFAMETHOXAZOLE-TRIMETHOPRIM 800-160 MG PO TABS: 1.0000 | ORAL_TABLET | Freq: Two times a day (BID) | ORAL | 0 refills | Status: AC

## 2020-08-27 NOTE — ED Triage Notes (Signed)
Patient complains of urinary urgency, increased urinary frequency, burning with urination, and lower back pain.

## 2020-08-27 NOTE — Discharge Instructions (Signed)
You have a urinary tract infection °Take the antibiotics as prescribed °Drink plenty of water.  °Follow up as needed for continued or worsening symptoms ° ° °

## 2020-08-29 LAB — URINE CULTURE: Culture: >=100000 — AB

## 2020-08-29 NOTE — ED Provider Notes (Signed)
[facility]    CSN: [id number] Arrival date & time: 08/27/20  1531      History   Chief Complaint Chief Complaint  Patient presents with  . Urinary Urgency  . Urinary Frequency  . Dysuria  . Back Pain    HPI Linda Carlson is a 39 y.o. female.   Patient is a 39 year old female presents today with urinary urgency, urinary frequency and dysuria.  She has had some mild lower back pain.  Symptoms been constant.  No fevers, nausea, vomiting.     Past Medical History:  Diagnosis Date  . ANEMIA, HX OF 03/14/2011   Qualifier: Diagnosis of  By: Sharen Hones  MD, Wynona Canes    . COVID-19 virus infection 03/2020  . Diabetes mellitus without complication (HCC)   . DIABETES MELLITUS, GESTATIONAL, HX OF 03/14/2011   Qualifier: Diagnosis of  By: Sharen Hones  MD, Wynona Canes    . Fatty liver   . Seasonal allergic rhinitis     Patient Active Problem List   Diagnosis Date Noted  . Adjustment disorder 01/31/2013  . Diabetes mellitus type 2, uncontrolled, with complications (HCC) 03/16/2011  . TRANSAMINASES, SERUM, ELEVATED 03/16/2011    History reviewed. No pertinent surgical history.  OB History   No obstetric history on file.      Home Medications    Prior to Admission medications   Medication Sig Start Date End Date Taking? Authorizing Provider  glimepiride (AMARYL) 1 MG tablet Take 1 tablet (1 mg total) by mouth daily with breakfast. 07/26/20   Eustaquio Boyden, MD  metFORMIN (GLUCOPHAGE) 500 MG tablet Take 1 tablet (500 mg total) by mouth 2 (two) times daily. 07/26/20   Eustaquio Boyden, MD  Multiple Vitamins-Minerals (MULTIVITAMIN PO) Take 1 tablet by mouth daily.    [provider]  sulfamethoxazole-trimethoprim (BACTRIM DS) 800-160 MG tablet Take 1 tablet by mouth 2 (two) times daily for 7 days. 08/27/20 09/03/20  Janace Aris, NP    Family History History reviewed. No pertinent family history.  Social History Social History   Tobacco Use  . Smoking  status: Never Smoker  . Smokeless tobacco: Never Used  Substance Use Topics  . Alcohol use: No  . Drug use: No     Allergies   Patient has no known allergies.   Review of Systems Review of Systems   Physical Exam Triage Vital Signs ED Triage Vitals  Enc Vitals Group     BP 08/27/20 1556 135/88     Pulse Rate 08/27/20 1556 97     Resp 08/27/20 1556 16     Temp 08/27/20 1556 98.3 F (36.8 C)     Temp src --      SpO2 08/27/20 1556 98 %     Weight --      Height --      Head Circumference --      Peak Flow --      Pain Score 08/27/20 1554 6     Pain Loc --      Pain Edu? --      Excl. in GC? --    No data found.  Updated Vital Signs BP 135/88   Pulse 97   Temp 98.3 F (36.8 C)   Resp 16   LMP 08/22/2020 (Within Days)   SpO2 98%   Visual Acuity Right Eye Distance:   Left Eye Distance:   Bilateral Distance:    Right Eye Near:   Left Eye Near:    Bilateral Near:  Physical Exam Vitals and nursing note reviewed.  Constitutional:      General: She is not in acute distress.    Appearance: Normal appearance. She is not ill-appearing, toxic-appearing or diaphoretic.  HENT:     Head: Normocephalic.     Nose: Nose normal.  Eyes:     Conjunctiva/sclera: Conjunctivae normal.  Pulmonary:     Effort: Pulmonary effort is normal.  Abdominal:     Tenderness: There is no right CVA tenderness or left CVA tenderness.  Musculoskeletal:        General: Normal range of motion.     Cervical back: Normal range of motion.  Skin:    General: Skin is warm and dry.     Findings: No rash.  Neurological:     Mental Status: She is alert.  Psychiatric:        Mood and Affect: Mood normal.      UC Treatments / Results  Labs (all labs ordered are listed, but only abnormal results are displayed) Labs Reviewed  URINE CULTURE - Abnormal; Notable for the following components:      Result Value   Culture   (*)    Value: >=100,000 COLONIES/mL GRAM NEGATIVE  RODS IDENTIFICATION AND SUSCEPTIBILITIES TO FOLLOW Performed at [facility], [address], [place], [place] [address]    All other components within normal limits  POCT URINALYSIS DIP (MANUAL ENTRY) - Abnormal; Notable for the following components:   Color, UA straw (*)    Clarity, UA cloudy (*)    Glucose, UA >=1,000 (*)    Ketones, POC UA small (15) (*)    Spec Grav, UA >=1.030 (*)    Blood, UA large (*)    Protein Ur, POC =100 (*)    Nitrite, UA Positive (*)    Leukocytes, UA Trace (*)    All other components within normal limits  POCT FASTING CBG [facility] MANUAL ENTRY - Abnormal; Notable for the following components:   POCT Glucose ([facility]) 238 (*)    All other components within normal limits    EKG   Radiology No results found.  Procedures Procedures (including critical care time)  Medications Ordered in UC Medications - No data to display  Initial Impression / Assessment and Plan / UC Course  I have reviewed the triage vital signs and the nursing notes.  Pertinent labs & imaging results that were available during my care of the patient were reviewed by me and considered in my medical decision making (see chart for details).     Acute cystitis with hematuria Based on urinalysis treating for urinary tract infection.  Sending for culture. Antibiotics as prescribed. Push fluids Follow up as needed for continued or worsening symptoms  Final Clinical Impressions(s) / UC Diagnoses   Final diagnoses:  Acute cystitis with hematuria     Discharge Instructions     You have a urinary tract infection Take the antibiotics as prescribed Drink plenty of water Follow up as needed for continued or worsening symptoms     ED Prescriptions    Medication Sig Dispense Auth. Provider   sulfamethoxazole-trimethoprim (BACTRIM DS) 800-160 MG tablet Take 1 tablet by mouth 2 (two) times daily for 7 days. 14 tablet Scotti Kosta A, NP     PDMP not reviewed this encounter.    Janace Aris, NP 08/29/20 [id number]

## 2020-09-07 ENCOUNTER — Ambulatory Visit: Payer: Federal, State, Local not specified - PPO | Admitting: Family Medicine

## 2021-10-25 ENCOUNTER — Other Ambulatory Visit: Payer: Self-pay

## 2021-10-25 ENCOUNTER — Ambulatory Visit
Admission: EM | Admit: 2021-10-25 | Discharge: 2021-10-25 | Disposition: A | Discharge status: Home / Self Care | Payer: Medicaid Other | Attending: Physician Assistant | Admitting: Physician Assistant

## 2021-10-25 ENCOUNTER — Encounter: Payer: Self-pay | Admitting: Emergency Medicine

## 2021-10-25 DIAGNOSIS — N898 Other specified noninflammatory disorders of vagina: Secondary | ICD-10-CM | POA: Insufficient documentation

## 2021-10-25 DIAGNOSIS — N3001 Acute cystitis with hematuria: Secondary | ICD-10-CM | POA: Diagnosis not present

## 2021-10-25 LAB — POCT URINALYSIS DIP (MANUAL ENTRY)
Glucose, UA: 500 mg/dL — AB
Nitrite, UA: POSITIVE — AB
Protein Ur, POC: >=300 mg/dL — AB
Spec Grav, UA: >=1.03 — AB (ref 1.010–1.025)
Urobilinogen, UA: 0.2 E.U./dL
pH, UA: 6 (ref 5.0–8.0)

## 2021-10-25 MED ORDER — NITROFURANTOIN MONOHYD MACRO 100 MG PO CAPS
100.0000 mg | ORAL_CAPSULE | Freq: Two times a day (BID) | ORAL | 0 refills | Status: DC
Start: 2021-10-25 — End: 2021-11-02

## 2021-10-25 NOTE — ED Provider Notes (Signed)
[facility]    CSN: [id number] Arrival date & time: 10/25/21  1905      History   Chief Complaint Chief Complaint  Patient presents with   Vaginitis    HPI Linda Carlson is a 40 y.o. female.   Patient here today for evaluation of possible yeast infection as well as UTI.  She reports that she tried Monistat 7-day for vaginal discharge and yeastlike symptoms, but now is having more dysuria, and external vaginal irritation.  She has not had fever or chills.  She is a diabetic and reports that her glucose levels have been more elevated.  She has not had any nausea or vomiting.  She does not report any back pain.  The history is provided by the patient.   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 No pertinent surgical history.  OB History   No obstetric history on file.      Home Medications    Prior to Admission medications   Medication Sig Start Date End Date Taking? Authorizing Provider  metFORMIN (GLUCOPHAGE) 500 MG tablet Take 1 tablet (500 mg total) by mouth 2 (two) times daily. 07/26/20  Yes Eustaquio Boyden, MD  nitrofurantoin, macrocrystal-monohydrate, (MACROBID) 100 MG capsule Take 1 capsule (100 mg total) by mouth 2 (two) times daily. 10/25/21  Yes Tomi Bamberger, PA-C  glimepiride (AMARYL) 1 MG tablet Take 1 tablet (1 mg total) by mouth daily with breakfast. 07/26/20   Eustaquio Boyden, MD  Multiple  Vitamins-Minerals (MULTIVITAMIN PO) Take 1 tablet by mouth daily.    [provider]    Family History No family history on file.  Social History Social History   Tobacco Use   Smoking status: Never   Smokeless tobacco: Never  Substance Use Topics   Alcohol use: No   Drug use: No     Allergies   Patient has no known allergies.   Review of Systems Review of Systems  Constitutional:  Negative for chills and fever.  Respiratory:  Negative for shortness of breath.   Cardiovascular:  Negative for chest pain.  Gastrointestinal:  Negative for abdominal pain, nausea and vomiting.  Genitourinary:  Positive for dysuria. Negative for frequency.  Musculoskeletal:  Negative for back pain.    Physical Exam Triage Vital Signs ED Triage Vitals [10/25/21 1918]  Enc Vitals Group     BP (!) 144/87     Pulse Rate 97     Resp      Temp 97.9 F (36.6 C)     Temp Source Oral     SpO2 99 %     Weight 170 lb (77.1 kg)     Height 5\' 5"  (1.651 m)     Head Circumference      Peak Flow      Pain Score 8     Pain Loc      Pain Edu?  Excl. in GC?    No data found.  Updated Vital Signs BP (!) 144/87 (BP Location: Right Arm)   Pulse 97   Temp 97.9 F (36.6 C) (Oral)   Ht 5\' 5"  (1.651 m)   Wt 170 lb (77.1 kg)   LMP 10/20/2021   SpO2 99%   BMI 28.29 kg/m   Physical Exam Vitals and nursing note reviewed.  Constitutional:      General: She is not in acute distress.    Appearance: Normal appearance. She is not ill-appearing.  HENT:     Head: Normocephalic and atraumatic.  Cardiovascular:     Rate and Rhythm: Normal rate.  Pulmonary:     Effort: Pulmonary effort is normal.  Skin:    General: Skin is warm and dry.  Neurological:     Mental Status: She is alert.  Psychiatric:        Mood and Affect: Mood normal.        Thought Content: Thought content normal.     UC Treatments / Results  Labs (all labs ordered are listed, but only abnormal results are  displayed) Labs Reviewed  POCT URINALYSIS DIP (MANUAL ENTRY) - Abnormal; Notable for the following components:      Result Value   Clarity, UA hazy (*)    Glucose, UA =500 (*)    Bilirubin, UA small (*)    Ketones, POC UA moderate (40) (*)    Spec Grav, UA >=1.030 (*)    Blood, UA large (*)    Protein Ur, POC >=300 (*)    Nitrite, UA Positive (*)    Leukocytes, UA Small (1+) (*)    All other components within normal limits  URINE CULTURE  CERVICOVAGINAL ANCILLARY ONLY    EKG   Radiology No results found.  Procedures Procedures (including critical care time)  Medications Ordered in UC Medications - No data to display  Initial Impression / Assessment and Plan / UC Course  I have reviewed the triage vital signs and the nursing notes.  Pertinent labs & imaging results that were available during my care of the patient were reviewed by me and considered in my medical decision making (see chart for details).  Macrobid prescribed for treatment of suspected UTI.  Urine culture ordered.  Will order screening for vaginal discharge as well.  Encouraged follow-up if symptoms fail to improve or worsen anyway.  Final Clinical Impressions(s) / UC Diagnoses   Final diagnoses:  Vaginal discharge  Acute cystitis with hematuria   Discharge Instructions   None    ED Prescriptions     Medication Sig Dispense Auth. Provider   nitrofurantoin, macrocrystal-monohydrate, (MACROBID) 100 MG capsule Take 1 capsule (100 mg total) by mouth 2 (two) times daily. 10 capsule 10/22/2021, PA-C      PDMP not reviewed this encounter.   Tomi Bamberger, PA-C 10/26/21 [phone]

## 2021-10-25 NOTE — ED Triage Notes (Signed)
Patient thinks she may have a yeast infection, itching, now having dysuria, vaginal area very tender and sore.  Patient has taken the OTC 7 day Monistat w/o relief.

## 2021-10-27 ENCOUNTER — Encounter: Payer: Self-pay | Admitting: Radiology

## 2021-10-27 ENCOUNTER — Emergency Department: Payer: Medicaid Other

## 2021-10-27 DIAGNOSIS — Z7984 Long term (current) use of oral hypoglycemic drugs: Secondary | ICD-10-CM

## 2021-10-27 DIAGNOSIS — A419 Sepsis, unspecified organism: Principal | ICD-10-CM | POA: Diagnosis present

## 2021-10-27 DIAGNOSIS — R197 Diarrhea, unspecified: Secondary | ICD-10-CM | POA: Diagnosis present

## 2021-10-27 DIAGNOSIS — E876 Hypokalemia: Secondary | ICD-10-CM | POA: Diagnosis present

## 2021-10-27 DIAGNOSIS — N390 Urinary tract infection, site not specified: Secondary | ICD-10-CM | POA: Diagnosis present

## 2021-10-27 DIAGNOSIS — Z20822 Contact with and (suspected) exposure to covid-19: Secondary | ICD-10-CM | POA: Diagnosis present

## 2021-10-27 DIAGNOSIS — E1165 Type 2 diabetes mellitus with hyperglycemia: Secondary | ICD-10-CM | POA: Diagnosis present

## 2021-10-27 DIAGNOSIS — K760 Fatty (change of) liver, not elsewhere classified: Secondary | ICD-10-CM | POA: Diagnosis present

## 2021-10-27 DIAGNOSIS — Z8616 Personal history of COVID-19: Secondary | ICD-10-CM

## 2021-10-27 LAB — URINE CULTURE: Culture: 40000 — AB

## 2021-10-27 MED ORDER — ACETAMINOPHEN 325 MG PO TABS
ORAL_TABLET | ORAL | Status: AC
  Administered 2021-10-27: 650 mg via ORAL
  Filled 2021-10-27: qty 2

## 2021-10-27 NOTE — ED Triage Notes (Signed)
Treated for uti with abx on sat.  Complains of vaginal discharge, low back pain, sore throat, nausea.

## 2021-10-28 ENCOUNTER — Emergency Department: Payer: Medicaid Other

## 2021-10-28 ENCOUNTER — Encounter: Payer: Self-pay | Admitting: Emergency Medicine

## 2021-10-28 ENCOUNTER — Inpatient Hospital Stay
Admission: EM | Admit: 2021-10-28 | Discharge: 2021-11-02 | DRG: 872 | Disposition: A | Discharge status: Home / Self Care | Payer: Medicaid Other | Attending: Internal Medicine | Admitting: Internal Medicine

## 2021-10-28 DIAGNOSIS — R300 Dysuria: Secondary | ICD-10-CM

## 2021-10-28 DIAGNOSIS — E119 Type 2 diabetes mellitus without complications: Secondary | ICD-10-CM | POA: Diagnosis not present

## 2021-10-28 DIAGNOSIS — A419 Sepsis, unspecified organism: Secondary | ICD-10-CM

## 2021-10-28 DIAGNOSIS — R509 Fever, unspecified: Secondary | ICD-10-CM

## 2021-10-28 DIAGNOSIS — N3000 Acute cystitis without hematuria: Secondary | ICD-10-CM

## 2021-10-28 DIAGNOSIS — E1169 Type 2 diabetes mellitus with other specified complication: Secondary | ICD-10-CM

## 2021-10-28 DIAGNOSIS — R1031 Right lower quadrant pain: Secondary | ICD-10-CM

## 2021-10-28 DIAGNOSIS — E876 Hypokalemia: Secondary | ICD-10-CM | POA: Diagnosis present

## 2021-10-28 DIAGNOSIS — N390 Urinary tract infection, site not specified: Secondary | ICD-10-CM | POA: Diagnosis present

## 2021-10-28 DIAGNOSIS — N12 Tubulo-interstitial nephritis, not specified as acute or chronic: Secondary | ICD-10-CM

## 2021-10-28 LAB — CBC WITH DIFFERENTIAL/PLATELET
Abs Immature Granulocytes: 0.03 10*3/uL (ref 0.00–0.07)
Basophils Absolute: 0 10*3/uL (ref 0.0–0.1)
Basophils Relative: 1 %
Eosinophils Absolute: 0 10*3/uL (ref 0.0–0.5)
Eosinophils Relative: 0 %
HCT: 37.6 % (ref 36.0–46.0)
Hemoglobin: 13.5 g/dL (ref 12.0–15.0)
Immature Granulocytes: 0 %
Lymphocytes Relative: 17 %
Lymphs Abs: 1.3 10*3/uL (ref 0.7–4.0)
MCH: 31.5 pg (ref 26.0–34.0)
MCHC: 35.9 g/dL (ref 30.0–36.0)
MCV: 87.9 fL (ref 80.0–100.0)
Monocytes Absolute: 0.4 10*3/uL (ref 0.1–1.0)
Monocytes Relative: 5 %
Neutro Abs: 5.9 10*3/uL (ref 1.7–7.7)
Neutrophils Relative %: 77 %
Platelets: 378 10*3/uL (ref 150–400)
RBC: 4.28 MIL/uL (ref 3.87–5.11)
RDW: 11.7 % (ref 11.5–15.5)
WBC: 7.7 10*3/uL (ref 4.0–10.5)
nRBC: 0 % (ref 0.0–0.2)

## 2021-10-28 LAB — RESP PANEL BY RT-PCR (FLU A&B, COVID) ARPGX2
Influenza A by PCR: NEGATIVE
Influenza B by PCR: NEGATIVE
SARS Coronavirus 2 by RT PCR: NEGATIVE

## 2021-10-28 LAB — COMPREHENSIVE METABOLIC PANEL
ALT: 30 U/L (ref 0–44)
AST: 15 U/L (ref 15–41)
Albumin: 3.9 g/dL (ref 3.5–5.0)
Alkaline Phosphatase: 48 U/L (ref 38–126)
Anion gap: 13 (ref 5–15)
BUN: 15 mg/dL (ref 6–20)
CO2: 24 mmol/L (ref 22–32)
Calcium: 9 mg/dL (ref 8.9–10.3)
Chloride: 102 mmol/L (ref 98–111)
Creatinine, Ser: 0.53 mg/dL (ref 0.44–1.00)
GFR, Estimated: >60 mL/min (ref >60)
Glucose, Bld: 225 mg/dL — ABNORMAL HIGH (ref 70–99)
Potassium: 3.4 mmol/L — ABNORMAL LOW (ref 3.5–5.1)
Sodium: 139 mmol/L (ref 135–145)
Total Bilirubin: 1 mg/dL (ref 0.3–1.2)
Total Protein: 8.9 g/dL — ABNORMAL HIGH (ref 6.5–8.1)

## 2021-10-28 LAB — HEMOGLOBIN A1C
Hgb A1c MFr Bld: 10.5 % — ABNORMAL HIGH (ref 4.8–5.6)
Mean Plasma Glucose: 254.65 mg/dL

## 2021-10-28 LAB — URINALYSIS, COMPLETE (UACMP) WITH MICROSCOPIC
Bilirubin Urine: NEGATIVE
Glucose, UA: 150 mg/dL — AB
Ketones, ur: 20 mg/dL — AB
Nitrite: NEGATIVE
Protein, ur: 100 mg/dL — AB
RBC / HPF: >50 RBC/hpf — ABNORMAL HIGH (ref 0–5)
Specific Gravity, Urine: 1.03 (ref 1.005–1.030)
WBC, UA: >50 WBC/hpf — ABNORMAL HIGH (ref 0–5)
pH: 5 (ref 5.0–8.0)

## 2021-10-28 LAB — PROTIME-INR
INR: 1.1 (ref 0.8–1.2)
Prothrombin Time: 13.8 seconds (ref 11.4–15.2)

## 2021-10-28 LAB — MAGNESIUM: Magnesium: 1.9 mg/dL (ref 1.7–2.4)

## 2021-10-28 LAB — GLUCOSE, CAPILLARY
Glucose-Capillary: 114 mg/dL — ABNORMAL HIGH (ref 70–99)
Glucose-Capillary: 135 mg/dL — ABNORMAL HIGH (ref 70–99)

## 2021-10-28 LAB — GROUP A STREP BY PCR: Group A Strep by PCR: NOT DETECTED

## 2021-10-28 LAB — POC URINE PREG, ED: Preg Test, Ur: NEGATIVE

## 2021-10-28 LAB — LACTIC ACID, PLASMA: Lactic Acid, Venous: 1.4 mmol/L (ref 0.5–1.9)

## 2021-10-28 LAB — CBG MONITORING, ED: Glucose-Capillary: 159 mg/dL — ABNORMAL HIGH (ref 70–99)

## 2021-10-28 LAB — HIV ANTIBODY (ROUTINE TESTING W REFLEX): HIV Screen 4th Generation wRfx: NONREACTIVE

## 2021-10-28 MED ORDER — MORPHINE SULFATE (PF) 4 MG/ML IV SOLN
4.0000 mg | Freq: Once | INTRAVENOUS | Status: AC
  Administered 2021-10-28: 4 mg via INTRAVENOUS
  Filled 2021-10-28: qty 1

## 2021-10-28 MED ORDER — LACTATED RINGERS IV BOLUS (SEPSIS)
1000.0000 mL | Freq: Once | INTRAVENOUS | Status: AC
  Administered 2021-10-28: 1000 mL via INTRAVENOUS

## 2021-10-28 MED ORDER — INSULIN ASPART 100 UNIT/ML IJ SOLN
0.0000 [IU] | INTRAMUSCULAR | Status: DC
  Administered 2021-10-28: 2 [IU] via SUBCUTANEOUS
  Filled 2021-10-28: qty 1

## 2021-10-28 MED ORDER — PHENOL 1.4 % MT LIQD
1.0000 | OROMUCOSAL | Status: DC | PRN
  Administered 2021-10-28: 1 via OROMUCOSAL
  Filled 2021-10-28 (×2): qty 177

## 2021-10-28 MED ORDER — ONDANSETRON HCL 4 MG PO TABS
4.0000 mg | ORAL_TABLET | Freq: Four times a day (QID) | ORAL | Status: DC | PRN
  Administered 2021-10-31 – 2021-11-02 (×6): 4 mg via ORAL
  Filled 2021-10-28 (×8): qty 1

## 2021-10-28 MED ORDER — LACTATED RINGERS IV BOLUS
1000.0000 mL | Freq: Once | INTRAVENOUS | Status: AC
  Administered 2021-10-28: 1000 mL via INTRAVENOUS

## 2021-10-28 MED ORDER — ACETAMINOPHEN 325 MG PO TABS
650.0000 mg | ORAL_TABLET | Freq: Four times a day (QID) | ORAL | Status: DC | PRN
  Administered 2021-10-28 – 2021-11-02 (×14): 650 mg via ORAL
  Filled 2021-10-28 (×16): qty 2

## 2021-10-28 MED ORDER — PANTOPRAZOLE SODIUM 40 MG IV SOLR
40.0000 mg | INTRAVENOUS | Status: DC
  Administered 2021-10-28 – 2021-11-02 (×6): 40 mg via INTRAVENOUS
  Filled 2021-10-28 (×6): qty 40

## 2021-10-28 MED ORDER — ONDANSETRON HCL 4 MG/2ML IJ SOLN
4.0000 mg | Freq: Four times a day (QID) | INTRAMUSCULAR | Status: DC | PRN
  Administered 2021-10-28 – 2021-11-01 (×6): 4 mg via INTRAVENOUS
  Filled 2021-10-28 (×7): qty 2

## 2021-10-28 MED ORDER — ONDANSETRON HCL 4 MG/2ML IJ SOLN
4.0000 mg | INTRAMUSCULAR | Status: AC
  Administered 2021-10-28: 4 mg via INTRAVENOUS
  Filled 2021-10-28: qty 2

## 2021-10-28 MED ORDER — SODIUM CHLORIDE 0.9 % IV SOLN
2.0000 g | INTRAVENOUS | Status: DC
  Administered 2021-10-28 – 2021-11-02 (×6): 2 g via INTRAVENOUS
  Filled 2021-10-28: qty 2
  Filled 2021-10-28 (×2): qty 20
  Filled 2021-10-28 (×3): qty 2

## 2021-10-28 MED ORDER — POTASSIUM CHLORIDE IN NACL 40-0.9 MEQ/L-% IV SOLN
INTRAVENOUS | Status: DC
  Filled 2021-10-28 (×10): qty 1000

## 2021-10-28 MED ORDER — ENOXAPARIN SODIUM 40 MG/0.4ML IJ SOSY
40.0000 mg | PREFILLED_SYRINGE | INTRAMUSCULAR | Status: DC
  Administered 2021-10-28 – 2021-11-02 (×6): 40 mg via SUBCUTANEOUS
  Filled 2021-10-28 (×6): qty 0.4

## 2021-10-28 MED ORDER — IOHEXOL 300 MG/ML  SOLN
100.0000 mL | Freq: Once | INTRAMUSCULAR | Status: AC | PRN
  Administered 2021-10-28: 100 mL via INTRAVENOUS

## 2021-10-28 MED ORDER — ACETAMINOPHEN 650 MG RE SUPP: 650.0000 mg | Freq: Four times a day (QID) | RECTAL | Status: DC | PRN

## 2021-10-28 MED ORDER — ACETAMINOPHEN 325 MG PO TABS
650.0000 mg | ORAL_TABLET | Freq: Once | ORAL | Status: AC
  Administered 2021-10-28: 650 mg via ORAL
  Filled 2021-10-28: qty 2

## 2021-10-28 NOTE — Sepsis Progress Note (Signed)
Sepsis protocol is being followed by [facility]. 

## 2021-10-28 NOTE — ED Provider Notes (Signed)
Seen initially by Dr. York Cerise and signed out to me.  Patient has history of fever right sided pain.  She has right-sided CVA tenderness on my exam.  She has been able to provide [place] a urine specimen which indicates a UTI this can bind with a fever, vomiting and pain suggest she has pyelonephritis.  Nothing shows up on the CT however.  Because of her nausea and vomiting pain and UTI I will go ahead and get her admitted for pyelonephritis and difficulty tolerating p.o.  I think after the [place] was under control she should be able to go home within a day or 2 when she can tolerate p.o. medication.   Arnaldo Natal, MD 10/28/21 [phone]

## 2021-10-28 NOTE — ED Provider Notes (Signed)
[facility] Emergency Department Provider Note  ____________________________________________   None    (approximate)  I have reviewed the triage vital signs and the nursing notes.   HISTORY  Chief Complaint Fever (Being treated for uti.)    HPI Linda Carlson is a 40 y.o. female  with PMH as listed below who presents for evaluation of fever, lower abd pain, right sided lower back pain, and N/V.  Reports symptoms of dysuria for  more than a week.  Went to urgent care, found to have nitrite+ urine, started on Macrobid.  For the last several days symptoms have been worsening, now with persistent N/V, inability to tolerate PO, fever to 101, bodyaches, and sharp and aching pain in lower right side of back as well as RLQ of abdomen.  Nothing makes the symptoms better or worse, and she describes them as severe.        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 Authorizing Provider  glimepiride (AMARYL) 1 MG tablet Take 1 tablet (1 mg total) by mouth daily with breakfast. 07/26/20   Eustaquio Boyden, MD  metFORMIN (GLUCOPHAGE) 500 MG tablet Take 1 tablet (500 mg total) by mouth 2 (two) times daily. 07/26/20   Eustaquio Boyden, MD  Multiple Vitamins-Minerals (MULTIVITAMIN PO) Take 1  tablet by mouth daily.    [provider]  nitrofurantoin, macrocrystal-monohydrate, (MACROBID) 100 MG capsule Take 1 capsule (100 mg total) by mouth 2 (two) times daily. 10/25/21   Tomi Bamberger, PA-C    Allergies Patient has no known allergies.  History reviewed. No pertinent family history.  Social History Social History   Tobacco Use   Smoking status: Never   Smokeless tobacco: Never  Substance Use Topics   Alcohol use: No   Drug use: No    Review of Systems Constitutional: Positive for fever/chills Eyes: No visual changes. ENT: No sore throat. Cardiovascular: Denies chest pain. Respiratory: Denies shortness of breath. Gastrointestinal: Positive for RLQ abd pain, right flank pain, persistent N/V, inability to tolerate PO. Genitourinary: Positive for dysuria Musculoskeletal: Positive for low back pain, particularly right flank. Integumentary: Negative for rash. Neurological: Negative for headaches, focal weakness or numbness.   ____________________________________________   PHYSICAL EXAM:  VITAL SIGNS: ED Triage Vitals  Enc Vitals Group     BP 10/27/21 2323 (!) 146/90     Pulse Rate 10/27/21 2323 (!) 118     Resp 10/27/21 2323 (!) 22     Temp 10/27/21 2323 (!) 101.2 F (38.4 C)     Temp Source 10/27/21 2323 Oral     SpO2 10/27/21 2323 100 %     Weight 10/27/21 2324 77.1 kg (170 lb)     Height 10/27/21 2324 1.651 m (5\' 5" )  Head Circumference --      Peak Flow --      Pain Score 10/27/21 2324 9     Pain Loc --      Pain Edu? --      Excl. in GC? --     Constitutional: Alert and oriented. Appears uncomfortable but not toxic. Eyes: Conjunctivae are normal.  Head: Atraumatic. Nose: No congestion/rhinnorhea. Mouth/Throat: Patient is wearing a mask. Neck: No stridor.  No meningeal signs.   Cardiovascular: Tachycardia, regular rhythm. Good peripheral circulation. Respiratory: Normal respiratory effort.  No retractions. Gastrointestinal:  Soft and non-distended.  Diffuse tenderness to palpation throughout, but acutely and more severely tender in RLQ w/ rebound and guarding, concerning for localized peritonitis. Musculoskeletal: Right flank tenderness to percussion. No gross deformities of extremities. Neurologic:  Normal speech and language. No gross focal neurologic deficits are appreciated.  Skin:  Skin is warm, dry and intact. Psychiatric: Mood and affect are normal. Speech and behavior are normal.  ____________________________________________   LABS (all labs ordered are listed, but only abnormal results are displayed)  Labs Reviewed  COMPREHENSIVE METABOLIC PANEL - Abnormal; Notable for the following components:      Result Value   Potassium 3.4 (*)    Glucose, Bld 225 (*)    Total Protein 8.9 (*)    All other components within normal limits  CULTURE, BLOOD (ROUTINE X 2)  CULTURE, BLOOD (ROUTINE X 2)  URINE CULTURE  RESP PANEL BY RT-PCR (FLU A&B, COVID) ARPGX2  LACTIC ACID, PLASMA  CBC WITH DIFFERENTIAL/PLATELET  PROTIME-INR  URINALYSIS, COMPLETE (UACMP) WITH MICROSCOPIC  POC URINE PREG, ED   ____________________________________________   RADIOLOGY I, Loleta Rose, personally viewed and evaluated these images (plain radiographs) as part of my medical decision making, as well as reviewing the written report by the radiologist.  ED MD interpretation:  Unremarkable CXR.  CT abd/pelvis pending at time of sign-out to Dr. Darnelle Catalan.  Official radiology report(s): DG Chest 2 View  Result Date: 10/28/2021 CLINICAL DATA:  Dysuria EXAM: CHEST - 2 VIEW COMPARISON:  None. FINDINGS: Lungs are clear.  No pleural effusion or pneumothorax. The heart is normal in size. Visualized osseous structures are within normal limits. IMPRESSION: Normal chest radiographs. Electronically Signed   By: Charline Bills M.D.   On: 10/28/2021 00:11    ____________________________________________   PROCEDURES   Procedure(s)  performed (including Critical Care):  .Critical Care Performed by: Loleta Rose, MD Authorized by: Loleta Rose, MD   Critical care provider statement:    Critical care time (minutes):  30   Critical care time was exclusive of:  Separately billable procedures and treating other patients   Critical care was necessary to treat or prevent imminent or life-threatening deterioration of the following conditions:  Sepsis   Critical care was time spent personally by me on the following activities:  Development of treatment plan with patient or surrogate, evaluation of patient's response to treatment, examination of patient, obtaining history from patient or surrogate, ordering and performing treatments and interventions, ordering and review of laboratory studies, ordering and review of radiographic studies, pulse oximetry, re-evaluation of patient's condition and review of old charts   ____________________________________________   INITIAL IMPRESSION / MDM / ASSESSMENT AND PLAN / ED COURSE  As part of my medical decision making, I reviewed the following data within the electronic MEDICAL RECORD NUMBER Nursing notes reviewed and incorporated, Labs reviewed , Old chart reviewed, Patient signed out to Dr. Darnelle Catalan, Radiograph reviewed , and Notes from prior  ED visits   Differential diagnosis includes, but is not limited to, UTI, pyelonephritis, infected ureteral stone, appendicitis, diverticulitis, nonspecific viral infection.  Vital signs in triage are notable for fever of greater than 101, tachycardia, and mild tachypnea.  Reassessment hours later demonstrates persistent tachycardia.  Tylenol seems to have improved the fever.  She has no respiratory symptoms other than the mild tachycardia when she was febrile.  I personally reviewed the patient's imaging and agree with the radiologist's interpretation that there is no evidence of acute abnormality such as pneumonia on chest x-ray.  Patient's physical  exam is concerning for localized peritonitis in the right lower quadrant.  She has had urinary symptoms for about a week and I reviewed the medical record showing that she had a nitrite positive urinalysis several days ago, but the urine culture did not grow back E. coli as anticipated, rather it appeared to possibly be a contaminant.  In spite of meeting SIRS/sepsis criteria, the patient's labs are all reassuring.  She has a comprehensive metabolic panel that is essentially normal, normal CBC with no leukocytosis, normal lactic acid, normal coags.  Urine cultures were obtained and thus far not showing any growth.  Respiratory viral panel is pending.  Given my concern for acute intra-abdominal infection and/or infected stone, I ordered a CT scan with IV contrast of the abdomen and pelvis.  Also ordered morphine 4 mg IV and Zofran 4 milligrams IV and 1 L LR bolus.  Patient has had a tubal ligation so we should not have to wait for urinalysis to obtain a CT scan.  Patient agrees with the plan.  Due to overwhelming ED and hospital patient volumes and limited staffing, the patient waited for a long time overnight in the waiting room.  I am making her code sepsis at this point; even though her lab work is all reassuring, she remains tachycardic and she was initially febrile and had a known urinary tract infection a couple of days ago.  I will err on the side of caution but given her second liter bolus even though she does not meet criteria for septic shock and I am ordering ceftriaxone 2 g IV.       Clinical Course as of 10/28/21 2409  [facility] Oct 28, 2021  [id number] Transferred ED care to Dr. Darnelle Catalan [CF]    Clinical Course User Index [CF] Loleta Rose, MD     ____________________________________________  FINAL CLINICAL IMPRESSION(S) / ED DIAGNOSES  Final diagnoses:  Fever, unspecified fever cause  Dysuria  Abdominal pain, RLQ (right lower quadrant)  Sepsis without acute organ dysfunction, due to  unspecified organism [facility])     MEDICATIONS GIVEN DURING THIS VISIT:  Medications  lactated ringers bolus 1,000 mL (has no administration in time range)  iohexol (OMNIPAQUE) 300 MG/ML solution 100 mL (has no administration in time range)  lactated ringers bolus 1,000 mL (has no administration in time range)  cefTRIAXone (ROCEPHIN) 2 g in sodium chloride 0.9 % 100 mL IVPB (has no administration in time range)  acetaminophen (TYLENOL) 325 MG tablet (650 mg Oral Given 10/27/21 2356)  ondansetron (ZOFRAN) injection 4 mg (4 mg Intravenous Given 10/28/21 0703)  morphine 4 MG/ML injection 4 mg (4 mg Intravenous Given 10/28/21 0703)  lactated ringers bolus 1,000 mL (1,000 mLs Intravenous New Bag/Given 10/28/21 0703)     ED Discharge Orders     None        Note:  This document was prepared using Dragon voice recognition software and  may include unintentional dictation errors.   Loleta Rose, MD 10/28/21 [phone]

## 2021-10-28 NOTE — H&P (Addendum)
History and Physical    Satcha Storlie NWG:[id number] DOB: 1981-07-15 DOA: 10/28/2021  PCP: Eustaquio Boyden, MD   Patient coming from: Home  I have personally briefly reviewed patient's old medical records in [facility]  Chief Complaint: Nausea/vomiting  HPI: Linda Carlson is a with a history of with medical history significant for diabetes mellitus who presents to the ER for evaluation of 2-day history of fever, dysuria, nausea, vomiting and low back pain. Patient states that she has had symptoms for about a week and initially thought she had a yeast infection and used Monistat without any improvement in her symptoms. She then went to an urgent care center and was told she had a UTI and was placed on Macrobid.  For the last 2 days she has had nausea, vomiting, inability to tolerate any oral intake, fever, chills as well as pain in her lower abdomen mostly right lower quadrant as well as both lumbar areas.. She denies having any chest pain, no shortness of breath, no dizziness, no palpitations, no headache, no changes in her bowel habits, no lower extremity swelling, no blurred vision, no focal deficits, no orthopnea, no paroxysmal nocturnal dyspnea. Labs show sodium 139, potassium 3.4, chloride 102, bicarb 24, glucose 225, BUN 15, creatinine 0.53, calcium 9.0, alkaline phosphatase 48, albumin 3.9, AST 15, ALT 30, total protein 8.9, total bilirubin 1.0, lactic acid 1.4, white count 7.7, hemoglobin 13.5, hematocrit 37.6, MCV 87.9, RDW 11.7, platelet count 370, PT 13.8, INR 1.1 Urine pregnancy test is negative Respiratory viral panel is negative Chest x-ray reviewed by me shows no evidence of acute cardiopulmonary disease CT scan of abdomen and pelvis shows no acute findings.  Hepatic steatosis.  Simple appearing left-sided pelvic cyst measuring up to 4.2 cm and having the appearance of a physiologic left ovarian cyst.     ED Course: Patient is a 40 year old female with a history  of diabetes mellitus who presents to the emergency room for evaluation of a 2-day history of nausea, vomiting and inability to tolerate any oral intake.  She has had a several day history of urinary frequency and dysuria and was treated as an outpatient with Macrobid. She presents to the emergency room with a T-max of 101.99F and was tachycardic.   She continues to have pyuria and has right-sided CVA tenderness. She received a dose of IV Rocephin in the ER but is still unable to tolerate oral intake. She will be admitted to the hospital for further evaluation.  Review of Systems: As per HPI otherwise all other systems reviewed and negative.    Past Medical History:  Diagnosis Date   ANEMIA, HX OF 03/14/2011   Qualifier: Diagnosis of  By: Sharen Hones  MD, Wynona Canes     COVID-19 virus infection 03/2020   Diabetes mellitus without complication (HCC)    DIABETES MELLITUS, GESTATIONAL, HX OF 03/14/2011   Qualifier: Diagnosis of  By: Sharen Hones  MD, Wynona Canes     Fatty liver    Seasonal allergic rhinitis     Past Surgical History:  Procedure Laterality Date   TUBAL LIGATION Bilateral      reports that she has never smoked. She has never used smokeless tobacco. She reports that she does not drink alcohol and does not use drugs.  No Known Allergies  Family History  Problem Relation Age of Onset   Hypertension Mother       Prior to Admission medications   Medication Sig Start Date End Date Taking? Authorizing Provider  glimepiride (AMARYL)  1 MG tablet Take 1 tablet (1 mg total) by mouth daily with breakfast. 07/26/20   Ria Bush, MD  metFORMIN (GLUCOPHAGE) 500 MG tablet Take 500 mg by mouth 2 (two) times daily.    [provider]  Multiple Vitamins-Minerals (MULTIVITAMIN PO) Take 1 tablet by mouth daily.    [provider]  nitrofurantoin, macrocrystal-monohydrate, (MACROBID) 100 MG capsule Take 1 capsule (100 mg total) by mouth 2 (two) times daily. 10/25/21   Francene Finders, PA-C    Physical Exam: Vitals:   10/28/21 0741 10/28/21 0742 10/28/21 0757 10/28/21 0900  BP: 112/72   127/79  Pulse:  87  92  Resp:    18  Temp:   98.6 F (37 C)   TempSrc:   Oral   SpO2:  97%  100%  Weight:      Height:         Vitals:   10/28/21 0741 10/28/21 0742 10/28/21 0757 10/28/21 0900  BP: 112/72   127/79  Pulse:  87  92  Resp:    18  Temp:   98.6 F (37 C)   TempSrc:   Oral   SpO2:  97%  100%  Weight:      Height:          Constitutional: Alert and oriented x 3 .  Acutely ill-appearing. Not in any apparent distress HEENT:      Head: Normocephalic and atraumatic.         Eyes: PERLA, EOMI, Conjunctivae are normal. Sclera is non-icteric.       Mouth/Throat: Mucous membranes are moist.       Neck: Supple with no signs of meningismus. Cardiovascular: Regular rate and rhythm. No murmurs, gallops, or rubs. 2+ symmetrical distal pulses are present . No JVD. No LE edema Respiratory: Respiratory effort normal .Lungs sounds clear bilaterally. No wheezes, crackles, or rhonchi.  Gastrointestinal: Soft, right lower quadrant tenderness, and non distended with positive bowel sounds.  Genitourinary: Right-sided CVA tenderness. Musculoskeletal: Nontender with normal range of motion in all extremities. No cyanosis, or erythema of extremities. Neurologic:  Face is symmetric. Moving all extremities. No gross focal neurologic deficits . Skin: Skin is warm, dry.  No rash or ulcers Psychiatric: Mood and affect are normal    Labs on Admission: I have personally reviewed following labs and imaging studies  CBC: Recent Labs  Lab 10/28/21 0141  WBC 7.7  NEUTROABS 5.9  HGB 13.5  HCT 37.6  MCV 87.9  PLT [id number]   Basic Metabolic Panel: Recent Labs  Lab 10/28/21 0141  NA 139  K 3.4*  CL 102  CO2 24  GLUCOSE 225*  BUN 15  CREATININE 0.53  CALCIUM 9.0   GFR: Estimated Creatinine Clearance: 95 mL/min (by C-G formula based on SCr of 0.53 mg/dL). Liver  Function Tests: Recent Labs  Lab 10/28/21 0141  AST 15  ALT 30  ALKPHOS 48  BILITOT 1.0  PROT 8.9*  ALBUMIN 3.9   No results for input(s): LIPASE, AMYLASE in the last 168 hours. No results for input(s): AMMONIA in the last 168 hours. Coagulation Profile: Recent Labs  Lab 10/28/21 0141  INR 1.1   Cardiac Enzymes: No results for input(s): CKTOTAL, CKMB, CKMBINDEX, TROPONINI in the last 168 hours. BNP (last 3 results) No results for input(s): PROBNP in the last 8760 hours. HbA1C: No results for input(s): HGBA1C in the last 72 hours. CBG: No results for input(s): GLUCAP in the last 168 hours. Lipid Profile: No  results for input(s): CHOL, HDL, LDLCALC, TRIG, CHOLHDL, LDLDIRECT in the last 72 hours. Thyroid Function Tests: No results for input(s): TSH, T4TOTAL, FREET4, T3FREE, THYROIDAB in the last 72 hours. Anemia Panel: No results for input(s): VITAMINB12, FOLATE, FERRITIN, TIBC, IRON, RETICCTPCT in the last 72 hours. Urine analysis:    Component Value Date/Time   COLORURINE AMBER (A) 10/28/2021 0808   APPEARANCEUR CLOUDY (A) 10/28/2021 0808   LABSPEC 1.030 10/28/2021 0808   PHURINE 5.0 10/28/2021 0808   GLUCOSEU 150 (A) 10/28/2021 0808   HGBUR MODERATE (A) 10/28/2021 0808   BILIRUBINUR NEGATIVE 10/28/2021 0808   BILIRUBINUR small (A) 10/25/2021 1932   KETONESUR 20 (A) 10/28/2021 0808   PROTEINUR 100 (A) 10/28/2021 0808   UROBILINOGEN 0.2 10/25/2021 1932   NITRITE NEGATIVE 10/28/2021 0808   LEUKOCYTESUR MODERATE (A) 10/28/2021 0808    Radiological Exams on Admission: DG Chest 2 View  Result Date: 10/28/2021 CLINICAL DATA:  Dysuria EXAM: CHEST - 2 VIEW COMPARISON:  None. FINDINGS: Lungs are clear.  No pleural effusion or pneumothorax. The heart is normal in size. Visualized osseous structures are within normal limits. IMPRESSION: Normal chest radiographs. Electronically Signed   By: Julian Hy M.D.   On: 10/28/2021 00:11   CT ABDOMEN PELVIS W  CONTRAST  Result Date: 10/28/2021 CLINICAL DATA:  Nausea, vomiting, lower abdominal pain, right-sided lower back pain, dysuria and fever. EXAM: CT ABDOMEN AND PELVIS WITH CONTRAST TECHNIQUE: Multidetector CT imaging of the abdomen and pelvis was performed using the standard protocol following bolus administration of intravenous contrast. CONTRAST:  171mL OMNIPAQUE IOHEXOL 300 MG/ML  SOLN COMPARISON:  Abdominal ultrasound on 04/28/2013 FINDINGS: Lower chest: No acute abnormality. Hepatobiliary: The liver demonstrates steatosis without focal lesion or biliary dilatation. The gallbladder is unremarkable. Extrahepatic bile ducts are nondilated. Pancreas: Unremarkable. No pancreatic ductal dilatation or surrounding inflammatory changes. Spleen: Normal in size without focal abnormality. Adrenals/Urinary Tract: Adrenal glands are unremarkable. Kidneys demonstrate no evidence of hydronephrosis, masses or calculi. There are some tiny renal cysts bilaterally which have a benign appearance. The bladder is unremarkable. Stomach/Bowel: Bowel shows no evidence of obstruction, ileus, inflammation or lesion. No free intraperitoneal air identified. The appendix is normal. Vascular/Lymphatic: No significant vascular findings are present. No enlarged abdominal or pelvic lymph nodes. Reproductive: Uterus appears unremarkable. There is a simple appearing left-sided pelvic cyst measuring approximately 4.2 x 3.3 cm and having the appearance a probable physiologic left ovarian cyst. Other: No abdominal wall hernia or abnormality. No abdominopelvic ascites. Musculoskeletal: No acute or significant osseous findings. IMPRESSION: 1. No acute findings in the abdomen or pelvis. 2. Hepatic steatosis. 3. Simple appearing left-sided pelvic cyst measuring up to 4.2 cm and having the appearance of a physiologic left ovarian cyst. 4.2 cm left ovarian simple-appearing cyst. No follow-up imaging is recommended. Reference: JACR 2020 Feb;17(2):248-254  Electronically Signed   By: Aletta Edouard M.D.   On: 10/28/2021 08:40     Assessment/Plan Principal Problem:   UTI (urinary tract infection) Active Problems:   Diabetes mellitus without complication ([place])   Hypokalemia      Patient is a 40 year old female with a history of diabetes mellitus who presents to the ER for evaluation of fever, dysuria and urinary frequency suspicious for acute pyelonephritis    UTI rule out pyelonephritis Patient with urinary symptoms which include dysuria and frequency and also has right-sided CVA tenderness Start patient on Rocephin 2 g IV daily Continue IV fluid resuscitation Follow-up results of blood and urine culture    Diabetes  mellitus Hold metformin and Amaryl due to persistent nausea and vomiting Continue aggressive IV fluid resuscitation Sliding scale insulin for glycemic control    Hypokalemia Secondary to nausea and vomiting Supplement potassium Magnesium levels  DVT prophylaxis: Lovenox  Code Status: full code  Family Communication: Greater than 50% of time was spent discussing patient's condition and plan of care with her at the bedside.  All questions and concerns have been addressed.  She verbalizes understanding and agrees with the plan. Disposition Plan: Back to previous home environment Consults called: none  Status: Observation    Kloie Whiting MD [facility]     10/28/2021, 10:06 AM

## 2021-10-28 NOTE — Consult Note (Signed)
CODE SEPSIS - PHARMACY COMMUNICATION  **Broad Spectrum Antibiotics should be administered within 1 hour of Sepsis diagnosis**  Time Code Sepsis Called/Page Received: 1610  Antibiotics Ordered: 0815  Time of 1st antibiotic administration: 0858  Additional action taken by pharmacy: N/A  If necessary, Name of Provider/Nurse Contacted: N/A  Martyn Malay ,PharmD Clinical Pharmacist  10/28/2021  8:10 AM

## 2021-10-29 DIAGNOSIS — N390 Urinary tract infection, site not specified: Secondary | ICD-10-CM | POA: Diagnosis present

## 2021-10-29 DIAGNOSIS — N3000 Acute cystitis without hematuria: Secondary | ICD-10-CM | POA: Diagnosis not present

## 2021-10-29 DIAGNOSIS — A419 Sepsis, unspecified organism: Secondary | ICD-10-CM | POA: Diagnosis present

## 2021-10-29 DIAGNOSIS — E1169 Type 2 diabetes mellitus with other specified complication: Secondary | ICD-10-CM | POA: Diagnosis not present

## 2021-10-29 DIAGNOSIS — R197 Diarrhea, unspecified: Secondary | ICD-10-CM | POA: Diagnosis present

## 2021-10-29 DIAGNOSIS — K760 Fatty (change of) liver, not elsewhere classified: Secondary | ICD-10-CM | POA: Diagnosis present

## 2021-10-29 DIAGNOSIS — E876 Hypokalemia: Secondary | ICD-10-CM | POA: Diagnosis present

## 2021-10-29 DIAGNOSIS — Z8616 Personal history of COVID-19: Secondary | ICD-10-CM | POA: Diagnosis not present

## 2021-10-29 DIAGNOSIS — Z7984 Long term (current) use of oral hypoglycemic drugs: Secondary | ICD-10-CM | POA: Diagnosis not present

## 2021-10-29 DIAGNOSIS — Z20822 Contact with and (suspected) exposure to covid-19: Secondary | ICD-10-CM | POA: Diagnosis present

## 2021-10-29 DIAGNOSIS — R112 Nausea with vomiting, unspecified: Secondary | ICD-10-CM | POA: Diagnosis present

## 2021-10-29 DIAGNOSIS — E1165 Type 2 diabetes mellitus with hyperglycemia: Secondary | ICD-10-CM | POA: Diagnosis present

## 2021-10-29 LAB — GLUCOSE, CAPILLARY
Glucose-Capillary: 104 mg/dL — ABNORMAL HIGH (ref 70–99)
Glucose-Capillary: 102 mg/dL — ABNORMAL HIGH (ref 70–99)
Glucose-Capillary: 102 mg/dL — ABNORMAL HIGH (ref 70–99)
Glucose-Capillary: 122 mg/dL — ABNORMAL HIGH (ref 70–99)
Glucose-Capillary: 97 mg/dL (ref 70–99)
Glucose-Capillary: 198 mg/dL — ABNORMAL HIGH (ref 70–99)

## 2021-10-29 LAB — CBC
HCT: 29 % — ABNORMAL LOW (ref 36.0–46.0)
Hemoglobin: 10 g/dL — ABNORMAL LOW (ref 12.0–15.0)
MCH: 30.4 pg (ref 26.0–34.0)
MCHC: 34.5 g/dL (ref 30.0–36.0)
MCV: 88.1 fL (ref 80.0–100.0)
Platelets: 295 10*3/uL (ref 150–400)
RBC: 3.29 MIL/uL — ABNORMAL LOW (ref 3.87–5.11)
RDW: 11.8 % (ref 11.5–15.5)
WBC: 6.1 10*3/uL (ref 4.0–10.5)
nRBC: 0 % (ref 0.0–0.2)

## 2021-10-29 LAB — URINE CULTURE

## 2021-10-29 LAB — BASIC METABOLIC PANEL
Anion gap: 8 (ref 5–15)
BUN: 11 mg/dL (ref 6–20)
CO2: 22 mmol/L (ref 22–32)
Calcium: 7.7 mg/dL — ABNORMAL LOW (ref 8.9–10.3)
Chloride: 107 mmol/L (ref 98–111)
Creatinine, Ser: 0.43 mg/dL — ABNORMAL LOW (ref 0.44–1.00)
GFR, Estimated: >60 mL/min (ref >60)
Glucose, Bld: 123 mg/dL — ABNORMAL HIGH (ref 70–99)
Potassium: 3.8 mmol/L (ref 3.5–5.1)
Sodium: 137 mmol/L (ref 135–145)

## 2021-10-29 LAB — CERVICOVAGINAL ANCILLARY ONLY
Bacterial Vaginitis (gardnerella): NEGATIVE
Candida Glabrata: NEGATIVE
Candida Vaginitis: NEGATIVE
Chlamydia: NEGATIVE
Comment: NEGATIVE
Comment: NEGATIVE
Comment: NEGATIVE
Comment: NEGATIVE
Comment: NEGATIVE
Comment: NORMAL
Neisseria Gonorrhea: NEGATIVE
Trichomonas: NEGATIVE

## 2021-10-29 MED ORDER — MENTHOL 3 MG MT LOZG
1.0000 | LOZENGE | OROMUCOSAL | Status: DC | PRN
  Filled 2021-10-29: qty 9

## 2021-10-29 MED ORDER — INSULIN ASPART 100 UNIT/ML IJ SOLN
0.0000 [IU] | Freq: Three times a day (TID) | INTRAMUSCULAR | Status: DC
  Administered 2021-10-29: 2 [IU] via SUBCUTANEOUS
  Filled 2021-10-29: qty 1

## 2021-10-29 MED ORDER — INSULIN ASPART 100 UNIT/ML IJ SOLN: 0.0000 [IU] | Freq: Three times a day (TID) | INTRAMUSCULAR | Status: DC

## 2021-10-29 MED ORDER — INSULIN ASPART 100 UNIT/ML IJ SOLN
0.0000 [IU] | Freq: Three times a day (TID) | INTRAMUSCULAR | Status: DC
  Administered 2021-10-30: 5 [IU] via SUBCUTANEOUS
  Administered 2021-10-30 – 2021-10-31 (×2): 3 [IU] via SUBCUTANEOUS
  Administered 2021-10-31: 2 [IU] via SUBCUTANEOUS
  Filled 2021-10-29 (×4): qty 1

## 2021-10-29 MED ORDER — INSULIN ASPART 100 UNIT/ML IJ SOLN: 0.0000 [IU] | Freq: Every day | INTRAMUSCULAR | Status: DC

## 2021-10-29 MED ORDER — SODIUM CHLORIDE 0.9 % IV SOLN
25.0000 mg | Freq: Four times a day (QID) | INTRAVENOUS | Status: DC | PRN
  Administered 2021-10-29 – 2021-11-01 (×6): 25 mg via INTRAVENOUS
  Filled 2021-10-29 (×3): qty 1
  Filled 2021-10-29: qty 25
  Filled 2021-10-29 (×5): qty 1

## 2021-10-29 NOTE — Progress Notes (Signed)
PROGRESS NOTE    Linda Carlson  VZD:[id number] DOB: 1981/04/12 DOA: 10/28/2021 PCP: Eustaquio Boyden, MD  [id number]   Assessment & Plan:   Principal Problem:   UTI (urinary tract infection) Active Problems:   Diabetes mellitus without complication (HCC)   Hypokalemia   Sepsis (HCC)   Linda Carlson is a 40 y.o. female with a history significant for diabetes mellitus who presented to the ER for evaluation of 2-day history of fever, dysuria, nausea, vomiting and low back pain.  Patient states that she has had symptoms for about a week and initially thought she had a yeast infection and used Monistat without any improvement in her symptoms. She then went to an urgent care center and was told she had a UTI and was placed on Macrobid.  For the last 2 days she has had nausea, vomiting, inability to tolerate any oral intake, fever, chills as well as pain in her lower abdomen mostly right lower quadrant as well as both lumbar areas.   Sepsis, POA --presented with fever of 101.2, tachycardia.  Source is urinary.  UTI  Possible pyelonephritis Patient with urinary symptoms which include dysuria and frequency and also has right-sided CVA tenderness.  CT a/p showed normal kidneys.   --Started on Rocephin 2 g IV daily --urine cx grew multiple species Plan: --cont ceftriaxone for now   Diabetes mellitus Hold metformin and Amaryl  --d/c BG checks and SSI since pt's BG have been within goal   Hypokalemia Secondary to nausea and vomiting --monitor and replete PRN  N/V --likely due to acute infection --cont MIVF@75  --zofran and phenergan PRN   DVT prophylaxis: Lovenox SQ Code Status: Full code  Family Communication:  Level of care: Med-Surg Dispo:   The patient is from: home Anticipated d/c is to: home Anticipated d/c date is: 2-3 days Patient currently is not medically ready to d/c due to: IV abx for sepsis 2/2 UTI   Subjective and Interval History:  Pt continued to  have right flank pain.  Dysuria had not improved.  N/V improved.  Reported BLE swelling since presentation.   Objective: Vitals:   10/29/21 0846 10/29/21 1203 10/29/21 1550 10/29/21 1929  BP: 134/81 139/89 127/79 116/73  Pulse: 91  93 95  Resp: 18 20 18 18   Temp: 98.6 F (37 C) 98.5 F (36.9 C) 98.5 F (36.9 C) 98.4 F (36.9 C)  TempSrc: Oral Oral Oral Oral  SpO2: 100% 99% 100% 100%  Weight:      Height:        Intake/Output Summary (Last 24 hours) at 10/29/2021 1937 Last data filed at 10/29/2021 1821 Gross per 24 hour  Intake 1584.28 ml  Output 1250 ml  Net 334.28 ml   Filed Weights   10/27/21 2324  Weight: 77.1 kg    Examination:   Constitutional: NAD, AAOx3 HEENT: conjunctivae and lids normal, EOMI CV: No cyanosis.   RESP: normal respiratory effort, on RA Extremities: ted hose on BLE SKIN: warm, dry Neuro: II - XII grossly intact.   Psych: depressed mood and affect.  Appropriate judgement and reason   Data Reviewed: I have personally reviewed following labs and imaging studies  CBC: Recent Labs  Lab 10/28/21 0141 10/29/21 0636  WBC 7.7 6.1  NEUTROABS 5.9  --   HGB 13.5 10.0*  HCT 37.6 29.0*  MCV 87.9 88.1  PLT 378 295   Basic Metabolic Panel: Recent Labs  Lab 10/28/21 0141 10/28/21 1320 10/29/21 0636  NA 139  --  137  K 3.4*  --  3.8  CL 102  --  107  CO2 24  --  22  GLUCOSE 225*  --  123*  BUN 15  --  11  CREATININE 0.53  --  0.43*  CALCIUM 9.0  --  7.7*  MG  --  1.9  --    GFR: Estimated Creatinine Clearance: 95 mL/min (A) (by C-G formula based on SCr of 0.43 mg/dL (L)). Liver Function Tests: Recent Labs  Lab 10/28/21 0141  AST 15  ALT 30  ALKPHOS 48  BILITOT 1.0  PROT 8.9*  ALBUMIN 3.9   No results for input(s): LIPASE, AMYLASE in the last 168 hours. No results for input(s): AMMONIA in the last 168 hours. Coagulation Profile: Recent Labs  Lab 10/28/21 0141  INR 1.1   Cardiac Enzymes: No results for input(s): CKTOTAL,  CKMB, CKMBINDEX, TROPONINI in the last 168 hours. BNP (last 3 results) No results for input(s): PROBNP in the last 8760 hours. HbA1C: Recent Labs    10/28/21 1320  HGBA1C 10.5*   CBG: Recent Labs  Lab 10/29/21 0645 10/29/21 0901 10/29/21 0927 10/29/21 1237 10/29/21 1717  GLUCAP 104* 102* 102* 122* 97   Lipid Profile: No results for input(s): CHOL, HDL, LDLCALC, TRIG, CHOLHDL, LDLDIRECT in the last 72 hours. Thyroid Function Tests: No results for input(s): TSH, T4TOTAL, FREET4, T3FREE, THYROIDAB in the last 72 hours. Anemia Panel: No results for input(s): VITAMINB12, FOLATE, FERRITIN, TIBC, IRON, RETICCTPCT in the last 72 hours. Sepsis Labs: Recent Labs  Lab 10/28/21 0147  LATICACIDVEN 1.4    Recent Results (from the past 240 hour(s))  Urine Culture     Status: Abnormal   Collection Time: 10/25/21  7:34 PM   Specimen: Urine, Clean Catch  Result Value Ref Range Status   Specimen Description URINE, CLEAN CATCH  Final   Special Requests NONE  Final   Culture (A)  Final    40,000 COLONIES/mL GROUP B STREP(S.AGALACTIAE)ISOLATED TESTING AGAINST S. AGALACTIAE NOT ROUTINELY PERFORMED DUE TO PREDICTABILITY OF AMP/PEN/VAN SUSCEPTIBILITY. Performed at [facility], [address], [place], [place] [address]    Report Status 10/27/2021 FINAL  Final  Culture, blood (Routine x 2)     Status: None (Preliminary result)   Collection Time: 10/27/21  1:40 AM   Specimen: BLOOD  Result Value Ref Range Status   Specimen Description BLOOD RAC  Final   Special Requests   Final    BOTTLES DRAWN AEROBIC AND ANAEROBIC Blood Culture adequate volume   Culture   Final    NO GROWTH 1 DAY Performed at [facility] [address] [place], [place] [address]    Report Status PENDING  Incomplete  Culture, blood (Routine x 2)     Status: None (Preliminary result)   Collection Time: 10/27/21  1:47 AM   Specimen: BLOOD  Result Value Ref Range Status   Specimen Description BLOOD  RHAND  Final   Special Requests   Final    BOTTLES DRAWN AEROBIC AND ANAEROBIC Blood Culture adequate volume   Culture   Final    NO GROWTH 1 DAY Performed at [facility], [address] [place], [place] [address]    Report Status PENDING  Incomplete  Resp Panel by RT-PCR (Flu A&B, Covid) Nasopharyngeal Swab     Status: None   Collection Time: 10/28/21  7:46 AM   Specimen: Nasopharyngeal Swab; Nasopharyngeal(NP) swabs in vial transport medium  Result Value Ref Range Status  SARS Coronavirus 2 by RT PCR NEGATIVE NEGATIVE Final    Comment: (NOTE) SARS-CoV-2 target nucleic acids are NOT DETECTED.  The SARS-CoV-2 RNA is generally detectable in upper respiratory specimens during the acute phase of infection. The lowest concentration of SARS-CoV-2 viral copies this assay can detect is 138 copies/mL. A negative result does not preclude SARS-Cov-2 infection and should not be used as the sole basis for treatment or other patient management decisions. A negative result may occur with  improper specimen collection/handling, submission of specimen other than nasopharyngeal swab, presence of viral mutation(s) within the areas targeted by this assay, and inadequate number of viral copies(<138 copies/mL). A negative result must be combined with clinical observations, patient history, and epidemiological information. The expected result is Negative.  Fact Sheet for Patients:  BloggerCourse.com  Fact Sheet for Healthcare Providers:  SeriousBroker.it  This test is no t yet approved or cleared by the [place] FDA and  has been authorized for detection and/or diagnosis of SARS-CoV-2 by FDA under an Emergency Use Authorization (EUA). This EUA will remain  in effect (meaning this test can be used) for the duration of the COVID-19 declaration under Section 564(b)(1) of the Act, 21 U.S.C.section 360bbb-3(b)(1), unless the  authorization is terminated  or revoked sooner.       Influenza A by PCR NEGATIVE NEGATIVE Final   Influenza B by PCR NEGATIVE NEGATIVE Final    Comment: (NOTE) The Xpert Xpress SARS-CoV-2/FLU/RSV plus assay is intended as an aid in the diagnosis of influenza from Nasopharyngeal swab specimens and should not be used as a sole basis for treatment. Nasal washings and aspirates are unacceptable for Xpert Xpress SARS-CoV-2/FLU/RSV testing.  Fact Sheet for Patients: BloggerCourse.com  Fact Sheet for Healthcare Providers: SeriousBroker.it  This test is not yet approved or cleared by the [place] FDA and has been authorized for detection and/or diagnosis of SARS-CoV-2 by FDA under an Emergency Use Authorization (EUA). This EUA will remain in effect (meaning this test can be used) for the duration of the COVID-19 declaration under Section 564(b)(1) of the Act, 21 U.S.C. section 360bbb-3(b)(1), unless the authorization is terminated or revoked.  Performed at [facility], [address] [place], [place] [address]   Urine Culture     Status: Abnormal   Collection Time: 10/28/21  8:08 AM   Specimen: Urine, Clean Catch  Result Value Ref Range Status   Specimen Description   Final    URINE, CLEAN CATCH Performed at [facility], [address], [place], [place] [address]    Special Requests   Final    NONE Performed at [facility], [address] [place], [place] [address]    Culture MULTIPLE SPECIES PRESENT, SUGGEST RECOLLECTION (A)  Final   Report Status 10/29/2021 FINAL  Final  Group A Strep by PCR ([facility] Only)     Status: None   Collection Time: 10/28/21  9:40 AM   Specimen: Throat; Sterile Swab  Result Value Ref Range Status   Group A Strep by PCR NOT DETECTED NOT DETECTED Final    Comment: Performed at [facility], [address], [place], [place] [address]      Radiology  Studies: DG Chest 2 View  Result Date: 10/28/2021 CLINICAL DATA:  Dysuria EXAM: CHEST - 2 VIEW COMPARISON:  None. FINDINGS: Lungs are clear.  No pleural effusion or pneumothorax. The heart is normal in size. Visualized osseous structures are within normal limits. IMPRESSION: Normal chest radiographs. Electronically Signed  By: Charline Bills M.D.   On: 10/28/2021 00:11   CT ABDOMEN PELVIS W CONTRAST  Result Date: 10/28/2021 CLINICAL DATA:  Nausea, vomiting, lower abdominal pain, right-sided lower back pain, dysuria and fever. EXAM: CT ABDOMEN AND PELVIS WITH CONTRAST TECHNIQUE: Multidetector CT imaging of the abdomen and pelvis was performed using the standard protocol following bolus administration of intravenous contrast. CONTRAST:  OMNIPAQUE IOHEXOL 300 MG/ML  SOLN COMPARISON:  Abdominal ultrasound on 04/28/2013 FINDINGS: Lower chest: No acute abnormality. Hepatobiliary: The liver demonstrates steatosis without focal lesion or biliary dilatation. The gallbladder is unremarkable. Extrahepatic bile ducts are nondilated. Pancreas: Unremarkable. No pancreatic ductal dilatation or surrounding inflammatory changes. Spleen: Normal in size without focal abnormality. Adrenals/Urinary Tract: Adrenal glands are unremarkable. Kidneys demonstrate no evidence of hydronephrosis, masses or calculi. There are some tiny renal cysts bilaterally which have a benign appearance. The bladder is unremarkable. Stomach/Bowel: Bowel shows no evidence of obstruction, ileus, inflammation or lesion. No free intraperitoneal air identified. The appendix is normal. Vascular/Lymphatic: No significant vascular findings are present. No enlarged abdominal or pelvic lymph nodes. Reproductive: Uterus appears unremarkable. There is a simple appearing left-sided pelvic cyst measuring approximately 4.2 x 3.3 cm and having the appearance a probable physiologic left ovarian cyst. Other: No abdominal wall hernia or abnormality. No  abdominopelvic ascites. Musculoskeletal: No acute or significant osseous findings. IMPRESSION: 1. No acute findings in the abdomen or pelvis. 2. Hepatic steatosis. 3. Simple appearing left-sided pelvic cyst measuring up to 4.2 cm and having the appearance of a physiologic left ovarian cyst. 4.2 cm left ovarian simple-appearing cyst. No follow-up imaging is recommended. Reference: JACR 2020 Feb;17(2):248-254 Electronically Signed   By: Irish Lack M.D.   On: 10/28/2021 08:40     Scheduled Meds:  enoxaparin (LOVENOX) injection  40 mg Subcutaneous Q24H   insulin aspart  0-15 Units Subcutaneous TID WC   pantoprazole (PROTONIX) IV  40 mg Intravenous Q24H   Continuous Infusions:  0.9 % NaCl with KCl 40 mEq / L 75 mL/hr at 10/29/21 1311   cefTRIAXone (ROCEPHIN)  IV 2 g (10/29/21 0934)   promethazine (PHENERGAN) injection (IM or IVPB) 25 mg (10/29/21 1127)     LOS: 0 days     Darlin Priestly, MD [facility] If 7PM-7AM, please contact night-coverage 10/29/2021, 7:37 PM

## 2021-10-30 DIAGNOSIS — N3000 Acute cystitis without hematuria: Secondary | ICD-10-CM | POA: Diagnosis not present

## 2021-10-30 LAB — URINALYSIS, COMPLETE (UACMP) WITH MICROSCOPIC
Bacteria, UA: NONE SEEN
Bilirubin Urine: NEGATIVE
Glucose, UA: 150 mg/dL — AB
Ketones, ur: 5 mg/dL — AB
Nitrite: NEGATIVE
Protein, ur: NEGATIVE mg/dL
Specific Gravity, Urine: 1.014 (ref 1.005–1.030)
WBC, UA: >50 WBC/hpf — ABNORMAL HIGH (ref 0–5)
pH: 6 (ref 5.0–8.0)

## 2021-10-30 LAB — CBC
HCT: 29 % — ABNORMAL LOW (ref 36.0–46.0)
Hemoglobin: 10.4 g/dL — ABNORMAL LOW (ref 12.0–15.0)
MCH: 31.8 pg (ref 26.0–34.0)
MCHC: 35.9 g/dL (ref 30.0–36.0)
MCV: 88.7 fL (ref 80.0–100.0)
Platelets: 275 10*3/uL (ref 150–400)
RBC: 3.27 MIL/uL — ABNORMAL LOW (ref 3.87–5.11)
RDW: 11.8 % (ref 11.5–15.5)
WBC: 5.9 10*3/uL (ref 4.0–10.5)
nRBC: 0 % (ref 0.0–0.2)

## 2021-10-30 LAB — BASIC METABOLIC PANEL
Anion gap: 7 (ref 5–15)
BUN: 6 mg/dL (ref 6–20)
CO2: 22 mmol/L (ref 22–32)
Calcium: 7.8 mg/dL — ABNORMAL LOW (ref 8.9–10.3)
Chloride: 109 mmol/L (ref 98–111)
Creatinine, Ser: 0.38 mg/dL — ABNORMAL LOW (ref 0.44–1.00)
GFR, Estimated: >60 mL/min (ref >60)
Glucose, Bld: 170 mg/dL — ABNORMAL HIGH (ref 70–99)
Potassium: 3.7 mmol/L (ref 3.5–5.1)
Sodium: 138 mmol/L (ref 135–145)

## 2021-10-30 LAB — GLUCOSE, CAPILLARY
Glucose-Capillary: 237 mg/dL — ABNORMAL HIGH (ref 70–99)
Glucose-Capillary: 163 mg/dL — ABNORMAL HIGH (ref 70–99)
Glucose-Capillary: 113 mg/dL — ABNORMAL HIGH (ref 70–99)
Glucose-Capillary: 97 mg/dL (ref 70–99)

## 2021-10-30 LAB — MAGNESIUM: Magnesium: 1.8 mg/dL (ref 1.7–2.4)

## 2021-10-30 MED ORDER — SODIUM CHLORIDE 0.9 % IV SOLN
12.5000 mg | Freq: Once | INTRAVENOUS | Status: AC
  Administered 2021-10-30: 12.5 mg via INTRAVENOUS
  Filled 2021-10-30: qty 12.5

## 2021-10-30 MED ORDER — POTASSIUM CHLORIDE IN NACL 40-0.9 MEQ/L-% IV SOLN
INTRAVENOUS | Status: AC
  Filled 2021-10-30 (×2): qty 1000

## 2021-10-30 MED ORDER — LIVING WELL WITH DIABETES BOOK
Freq: Once | Status: DC
  Filled 2021-10-30: qty 1

## 2021-10-30 MED ORDER — TRAMADOL HCL 50 MG PO TABS
100.0000 mg | ORAL_TABLET | Freq: Four times a day (QID) | ORAL | Status: DC | PRN
  Administered 2021-10-30 – 2021-11-01 (×4): 100 mg via ORAL
  Filled 2021-10-30 (×4): qty 2

## 2021-10-30 NOTE — Progress Notes (Signed)
Inpatient Diabetes Program Recommendations  AACE/ADA: New Consensus Statement on Inpatient Glycemic Control (2015)  Target Ranges:  Prepandial:   less than 140 mg/dL      Peak postprandial:   less than 180 mg/dL (1-2 hours)      Critically ill patients:  140 - 180 mg/dL   Lab Results  Component Value Date   GLUCAP 163 (H) 10/30/2021   HGBA1C 10.5 (H) 10/28/2021    Review of Glycemic Control Results for Linda Carlson, Linda Carlson (MRN [id number]) as of 10/30/2021 14:07  Ref. Range 10/29/2021 12:37 10/29/2021 17:17 10/29/2021 21:47 10/30/2021 08:57 10/30/2021 13:18  Glucose-Capillary Latest Ref Range: 70 - 99 mg/dL 865 (H) 97 784 (H) 696 (H) 163 (H)   Diabetes history: DM 2 Outpatient Diabetes medications:  Per medication reconciliation patient not taking oral DM meds, however patient states she is taking her Metformin  Current orders for Inpatient glycemic control:  Novolog moderate tid with meals and HS  Inpatient Diabetes Program Recommendations:    Discussed A1C results with patient and her DM management.  Patient is sleepy and states that she is taking her metformin. Reported her current A1C and she states that it is "usually high".  It appears that she was newly diagnosed with DM in 2021.  She needs close f/u for her DM with PCP.   -At d/c she will need refill of Metformin and possibly another oral agent added.  Discussed briefly the goal A1C with patient and the importance of glucose control .  Patient sleepy but verbalized understanding.  Will order LWWD booklet as well.   Thanks,  Beryl Meager, RN, BC-ADM Inpatient Diabetes Coordinator Pager [phone]  (8a-5p)

## 2021-10-30 NOTE — Progress Notes (Signed)
PROGRESS NOTE    Linda Carlson  DXI:[id number] DOB: 03-07-1981 DOA: 10/28/2021 PCP: Eustaquio Boyden, MD  [id number]   Assessment & Plan:   Principal Problem:   UTI (urinary tract infection) Active Problems:   Diabetes mellitus without complication (HCC)   Hypokalemia   Sepsis (HCC)   Linda Carlson is a 40 y.o. female with a history significant for diabetes mellitus who presented to the ER for evaluation of 2-day history of fever, dysuria, nausea, vomiting and low back pain.  Patient states that she has had symptoms for about a week and initially thought she had a yeast infection and used Monistat without any improvement in her symptoms. She then went to an urgent care center and was told she had a UTI and was placed on Macrobid.  For the last 2 days she has had nausea, vomiting, inability to tolerate any oral intake, fever, chills as well as pain in her lower abdomen mostly right lower quadrant as well as both lumbar areas.   Sepsis, POA --presented with fever of 101.2, tachycardia.  Source is urinary.  UTI  Possible pyelonephritis Patient with urinary symptoms which include dysuria and frequency and also has right-sided CVA tenderness.  CT a/p showed normal kidneys.   --Started on Rocephin 2 g IV daily, however, symptoms have not improved. --urine cx grew multiple species Plan: --cont ceftriaxone for now, day 3 --repeat UA and urine cx today   Diabetes mellitus, poorly controlled --A1c 10.5.  Likely pt wasn't taking her home metformin and Amaryl  --ACHS and SSI --tentative plan on discharge back on home metformin and Amaryl, per diabetic coordinator.   Hypokalemia Secondary to nausea and vomiting --monitor and replete PRN  N/V --likely due to acute infection --cont MIVF@75  for 12 more hours --zofran and phenergan PRN  Diarrhea, not POA --started yesterday, likely due to abx --No need for C diff test   DVT prophylaxis: Lovenox SQ Code Status: Full code   Family Communication:  Level of care: Med-Surg Dispo:   The patient is from: home Anticipated d/c is to: home Anticipated d/c date is: 2-3 days Patient currently is not medically ready to d/c due to: IV abx for sepsis 2/2 UTI   Subjective and Interval History:  Pt continued to have right lower back pain, not improved.  Continued to have dysuria and bladder pain, not improved.  Able to eat something, but poor appetite.  Started having diarrhea yesterday, multiple episodes.   Objective: Vitals:   10/29/21 2332 10/30/21 0322 10/30/21 0854 10/30/21 1217  BP: (!) 143/83 126/86 138/84 128/84  Pulse: (!) 101 83 84 89  Resp: 18 18 18 20   Temp:  98.7 F (37.1 C) 98.4 F (36.9 C) 98.7 F (37.1 C)  TempSrc: Oral Oral Oral Oral  SpO2: 99% 96% 100% 100%  Weight:      Height:        Intake/Output Summary (Last 24 hours) at 10/30/2021 1736 Last data filed at 10/30/2021 0507 Gross per 24 hour  Intake 3191.98 ml  Output --  Net 3191.98 ml   Filed Weights   10/27/21 2324  Weight: 77.1 kg    Examination:   Constitutional: NAD, AAOx3 HEENT: conjunctivae and lids normal, EOMI CV: No cyanosis.   RESP: normal respiratory effort, on RA GI: suprapubic tenderness with palpation Extremities: No effusions, edema in BLE SKIN: warm, dry Neuro: II - XII grossly intact.   Psych: depressed mood and affect.  Appropriate judgement and reason   Data Reviewed: I have  personally reviewed following labs and imaging studies  CBC: Recent Labs  Lab 10/28/21 0141 10/29/21 0636 10/30/21 0407  WBC 7.7 6.1 5.9  NEUTROABS 5.9  --   --   HGB 13.5 10.0* 10.4*  HCT 37.6 29.0* 29.0*  MCV 87.9 88.1 88.7  PLT 378 295 275   Basic Metabolic Panel: Recent Labs  Lab 10/28/21 0141 10/28/21 1320 10/29/21 0636 10/30/21 0407  NA 139  --  137 138  K 3.4*  --  3.8 3.7  CL 102  --  107 109  CO2 24  --  22 22  GLUCOSE 225*  --  123* 170*  BUN 15  --  11 6  CREATININE 0.53  --  0.43* 0.38*   CALCIUM 9.0  --  7.7* 7.8*  MG  --  1.9  --  1.8   GFR: Estimated Creatinine Clearance: 95 mL/min (A) (by C-G formula based on SCr of 0.38 mg/dL (L)). Liver Function Tests: Recent Labs  Lab 10/28/21 0141  AST 15  ALT 30  ALKPHOS 48  BILITOT 1.0  PROT 8.9*  ALBUMIN 3.9   No results for input(s): LIPASE, AMYLASE in the last 168 hours. No results for input(s): AMMONIA in the last 168 hours. Coagulation Profile: Recent Labs  Lab 10/28/21 0141  INR 1.1   Cardiac Enzymes: No results for input(s): CKTOTAL, CKMB, CKMBINDEX, TROPONINI in the last 168 hours. BNP (last 3 results) No results for input(s): PROBNP in the last 8760 hours. HbA1C: Recent Labs    10/28/21 1320  HGBA1C 10.5*   CBG: Recent Labs  Lab 10/29/21 1717 10/29/21 2147 10/30/21 0857 10/30/21 1318 10/30/21 1735  GLUCAP 97 198* 237* 163* 113*   Lipid Profile: No results for input(s): CHOL, HDL, LDLCALC, TRIG, CHOLHDL, LDLDIRECT in the last 72 hours. Thyroid Function Tests: No results for input(s): TSH, T4TOTAL, FREET4, T3FREE, THYROIDAB in the last 72 hours. Anemia Panel: No results for input(s): VITAMINB12, FOLATE, FERRITIN, TIBC, IRON, RETICCTPCT in the last 72 hours. Sepsis Labs: Recent Labs  Lab 10/28/21 0147  LATICACIDVEN 1.4    Recent Results (from the past 240 hour(s))  Urine Culture     Status: Abnormal   Collection Time: 10/25/21  7:34 PM   Specimen: Urine, Clean Catch  Result Value Ref Range Status   Specimen Description URINE, CLEAN CATCH  Final   Special Requests NONE  Final   Culture (A)  Final    40,000 COLONIES/mL GROUP B STREP(S.AGALACTIAE)ISOLATED TESTING AGAINST S. AGALACTIAE NOT ROUTINELY PERFORMED DUE TO PREDICTABILITY OF AMP/PEN/VAN SUSCEPTIBILITY. Performed at [facility], [address], [place], [place] [address]    Report Status 10/27/2021 FINAL  Final  Culture, blood (Routine x 2)     Status: None (Preliminary result)   Collection Time: 10/27/21  1:40 AM    Specimen: BLOOD  Result Value Ref Range Status   Specimen Description BLOOD RAC  Final   Special Requests   Final    BOTTLES DRAWN AEROBIC AND ANAEROBIC Blood Culture adequate volume   Culture   Final    NO GROWTH 2 DAYS Performed at [facility], [address], [place], [place] [address]    Report Status PENDING  Incomplete  Culture, blood (Routine x 2)     Status: None (Preliminary result)   Collection Time: 10/27/21  1:47 AM   Specimen: BLOOD  Result Value Ref Range Status   Specimen Description BLOOD RHAND  Final   Special Requests   Final  BOTTLES DRAWN AEROBIC AND ANAEROBIC Blood Culture adequate volume   Culture   Final    NO GROWTH 2 DAYS Performed at [facility], [address] [place], [place] [address]    Report Status PENDING  Incomplete  Resp Panel by RT-PCR (Flu A&B, Covid) Nasopharyngeal Swab     Status: None   Collection Time: 10/28/21  7:46 AM   Specimen: Nasopharyngeal Swab; Nasopharyngeal(NP) swabs in vial transport medium  Result Value Ref Range Status   SARS Coronavirus 2 by RT PCR NEGATIVE NEGATIVE Final    Comment: (NOTE) SARS-CoV-2 target nucleic acids are NOT DETECTED.  The SARS-CoV-2 RNA is generally detectable in upper respiratory specimens during the acute phase of infection. The lowest concentration of SARS-CoV-2 viral copies this assay can detect is 138 copies/mL. A negative result does not preclude SARS-Cov-2 infection and should not be used as the sole basis for treatment or other patient management decisions. A negative result may occur with  improper specimen collection/handling, submission of specimen other than nasopharyngeal swab, presence of viral mutation(s) within the areas targeted by this assay, and inadequate number of viral copies(<138 copies/mL). A negative result must be combined with clinical observations, patient history, and epidemiological information. The expected result is Negative.  Fact Sheet for  Patients:  BloggerCourse.com  Fact Sheet for Healthcare Providers:  SeriousBroker.it  This test is no t yet approved or cleared by the [place] FDA and  has been authorized for detection and/or diagnosis of SARS-CoV-2 by FDA under an Emergency Use Authorization (EUA). This EUA will remain  in effect (meaning this test can be used) for the duration of the COVID-19 declaration under Section 564(b)(1) of the Act, 21 U.S.C.section 360bbb-3(b)(1), unless the authorization is terminated  or revoked sooner.       Influenza A by PCR NEGATIVE NEGATIVE Final   Influenza B by PCR NEGATIVE NEGATIVE Final    Comment: (NOTE) The Xpert Xpress SARS-CoV-2/FLU/RSV plus assay is intended as an aid in the diagnosis of influenza from Nasopharyngeal swab specimens and should not be used as a sole basis for treatment. Nasal washings and aspirates are unacceptable for Xpert Xpress SARS-CoV-2/FLU/RSV testing.  Fact Sheet for Patients: BloggerCourse.com  Fact Sheet for Healthcare Providers: SeriousBroker.it  This test is not yet approved or cleared by the [place] FDA and has been authorized for detection and/or diagnosis of SARS-CoV-2 by FDA under an Emergency Use Authorization (EUA). This EUA will remain in effect (meaning this test can be used) for the duration of the COVID-19 declaration under Section 564(b)(1) of the Act, 21 U.S.C. section 360bbb-3(b)(1), unless the authorization is terminated or revoked.  Performed at [facility], [address], [place], [place] [address]   Urine Culture     Status: Abnormal   Collection Time: 10/28/21  8:08 AM   Specimen: Urine, Clean Catch  Result Value Ref Range Status   Specimen Description   Final    URINE, CLEAN CATCH Performed at [facility], [address], [place], [place] [address]    Special Requests   Final     NONE Performed at [facility], [address] [place], [place] [address]    Culture MULTIPLE SPECIES PRESENT, SUGGEST RECOLLECTION (A)  Final   Report Status 10/29/2021 FINAL  Final  Group A Strep by PCR ([facility] Only)     Status: None   Collection Time: 10/28/21  9:40 AM   Specimen: Throat; Sterile Swab  Result Value Ref Range Status  Group A Strep by PCR NOT DETECTED NOT DETECTED Final    Comment: Performed at [facility], [address] [place], [place] [address]      Radiology Studies: No results found.   Scheduled Meds:  enoxaparin (LOVENOX) injection  40 mg Subcutaneous Q24H   insulin aspart  0-15 Units Subcutaneous TID WC   insulin aspart  0-5 Units Subcutaneous QHS   living well with diabetes book   Does not apply Once   pantoprazole (PROTONIX) IV  40 mg Intravenous Q24H   Continuous Infusions:  0.9 % NaCl with KCl 40 mEq / L 75 mL/hr at 10/30/21 0507   cefTRIAXone (ROCEPHIN)  IV 2 g (10/30/21 1006)   promethazine (PHENERGAN) injection (IM or IVPB) 25 mg (10/30/21 1408)     LOS: 1 day     Darlin Priestly, MD [facility] If 7PM-7AM, please contact night-coverage 10/30/2021, 5:36 PM

## 2021-10-31 DIAGNOSIS — N390 Urinary tract infection, site not specified: Secondary | ICD-10-CM

## 2021-10-31 DIAGNOSIS — E1169 Type 2 diabetes mellitus with other specified complication: Secondary | ICD-10-CM | POA: Diagnosis not present

## 2021-10-31 LAB — GLUCOSE, CAPILLARY
Glucose-Capillary: 95 mg/dL (ref 70–99)
Glucose-Capillary: 155 mg/dL — ABNORMAL HIGH (ref 70–99)
Glucose-Capillary: 148 mg/dL — ABNORMAL HIGH (ref 70–99)
Glucose-Capillary: 160 mg/dL — ABNORMAL HIGH (ref 70–99)

## 2021-10-31 LAB — CBC
HCT: 31 % — ABNORMAL LOW (ref 36.0–46.0)
Hemoglobin: 10.4 g/dL — ABNORMAL LOW (ref 12.0–15.0)
MCH: 29.8 pg (ref 26.0–34.0)
MCHC: 33.5 g/dL (ref 30.0–36.0)
MCV: 88.8 fL (ref 80.0–100.0)
Platelets: 330 10*3/uL (ref 150–400)
RBC: 3.49 MIL/uL — ABNORMAL LOW (ref 3.87–5.11)
RDW: 11.8 % (ref 11.5–15.5)
WBC: 7.4 10*3/uL (ref 4.0–10.5)
nRBC: 0 % (ref 0.0–0.2)

## 2021-10-31 LAB — URINE CULTURE: Culture: NO GROWTH

## 2021-10-31 LAB — BASIC METABOLIC PANEL
Anion gap: 8 (ref 5–15)
BUN: <5 mg/dL — ABNORMAL LOW (ref 6–20)
CO2: 25 mmol/L (ref 22–32)
Calcium: 7.9 mg/dL — ABNORMAL LOW (ref 8.9–10.3)
Chloride: 103 mmol/L (ref 98–111)
Creatinine, Ser: 0.48 mg/dL (ref 0.44–1.00)
GFR, Estimated: >60 mL/min (ref >60)
Glucose, Bld: 92 mg/dL (ref 70–99)
Potassium: 3.8 mmol/L (ref 3.5–5.1)
Sodium: 136 mmol/L (ref 135–145)

## 2021-10-31 LAB — MAGNESIUM: Magnesium: 1.7 mg/dL (ref 1.7–2.4)

## 2021-10-31 MED ORDER — SIMETHICONE 80 MG PO CHEW
80.0000 mg | CHEWABLE_TABLET | Freq: Four times a day (QID) | ORAL | Status: DC | PRN
  Administered 2021-10-31: 80 mg via ORAL
  Filled 2021-10-31: qty 1

## 2021-10-31 NOTE — Progress Notes (Signed)
PROGRESS NOTE    Linda Carlson  XYI:[id number] DOB: 1981/01/16 DOA: 10/28/2021 PCP: Ria Bush, MD   Assessment & Plan:   Principal Problem:   UTI (urinary tract infection) Active Problems:   Diabetes mellitus without complication ([place])   Hypokalemia   Sepsis ([place])   Sepsis: POA. Met criteria w/ fever, tachycardia & UTI. Continue on IV abxs. Sepsis resolved    UTI: w/ possible pyelonephritis. Dysuria and frequency and also has right-sided CVA tenderness.Continue on IV rocephin. Repeat urine cx shows no growth    DM2: poorly controlled, HbA1c 10.5. Continue on SSI w/ accuchecks    Hypokalemia: WNL today    Nausea & vomiting: likely due to acute infection. Zofran, phenergan prn. Still w/ nausea today    DVT prophylaxis: lovenox  Code Status:  full  Family Communication:  Disposition Plan: likely d/c back home   Level of care: Med-Surg  Status is: Inpatient  Remains inpatient appropriate because: severity of illness, likely d/c home tomorrow    Consultants:   Procedures:   Antimicrobials: rocephin   Subjective: Pt c/o nausea and headache   Objective: Vitals:   10/30/21 1217 10/30/21 1909 10/30/21 2339 10/31/21 0338  BP: 128/84 (!) 156/91 (!) 144/91 128/77  Pulse: 89 91 95 91  Resp: 20 18 18 18   Temp: 98.7 F (37.1 C) 98.8 F (37.1 C) 99 F (37.2 C) 98.5 F (36.9 C)  TempSrc: Oral Oral Oral Oral  SpO2: 100% 98% 98% 100%  Weight:      Height:        Intake/Output Summary (Last 24 hours) at 10/31/2021 0825 Last data filed at 10/31/2021 2707 Gross per 24 hour  Intake 653.74 ml  Output 500 ml  Net 153.74 ml   Filed Weights   10/27/21 2324  Weight: 77.1 kg    Examination:  General exam: Appears calm but uncomfortable  Respiratory system: Clear to auscultation. Respiratory effort normal. Cardiovascular system: S1 & S2+. No rubs, gallops or clicks.  Gastrointestinal system: Abdomen is nondistended, soft and nontender.Normal bowel sounds  heard. Central nervous system: Alert and oriented. Moves all extremities  Psychiatry: Judgement and insight appear normal. Flat mood and affect    Data Reviewed: I have personally reviewed following labs and imaging studies  CBC: Recent Labs  Lab 10/28/21 0141 10/29/21 0636 10/30/21 0407 10/31/21 0558  WBC 7.7 6.1 5.9 7.4  NEUTROABS 5.9  --   --   --   HGB 13.5 10.0* 10.4* 10.4*  HCT 37.6 29.0* 29.0* 31.0*  MCV 87.9 88.1 88.7 88.8  PLT 378 295 275 867   Basic Metabolic Panel: Recent Labs  Lab 10/28/21 0141 10/28/21 1320 10/29/21 0636 10/30/21 0407 10/31/21 0558  NA 139  --  137 138 136  K 3.4*  --  3.8 3.7 3.8  CL 102  --  107 109 103  CO2 24  --  22 22 25   GLUCOSE 225*  --  123* 170* 92  BUN 15  --  11 6 <5*  CREATININE 0.53  --  0.43* 0.38* 0.48  CALCIUM 9.0  --  7.7* 7.8* 7.9*  MG  --  1.9  --  1.8 1.7   GFR: Estimated Creatinine Clearance: 95 mL/min (by C-G formula based on SCr of 0.48 mg/dL). Liver Function Tests: Recent Labs  Lab 10/28/21 0141  AST 15  ALT 30  ALKPHOS 48  BILITOT 1.0  PROT 8.9*  ALBUMIN 3.9   No results for input(s): LIPASE, AMYLASE in the last  168 hours. No results for input(s): AMMONIA in the last 168 hours. Coagulation Profile: Recent Labs  Lab 10/28/21 0141  INR 1.1   Cardiac Enzymes: No results for input(s): CKTOTAL, CKMB, CKMBINDEX, TROPONINI in the last 168 hours. BNP (last 3 results) No results for input(s): PROBNP in the last 8760 hours. HbA1C: Recent Labs    10/28/21 1320  HGBA1C 10.5*   CBG: Recent Labs  Lab 10/29/21 2147 10/30/21 0857 10/30/21 1318 10/30/21 1735 10/30/21 2132  GLUCAP 198* 237* 163* 113* 97   Lipid Profile: No results for input(s): CHOL, HDL, LDLCALC, TRIG, CHOLHDL, LDLDIRECT in the last 72 hours. Thyroid Function Tests: No results for input(s): TSH, T4TOTAL, FREET4, T3FREE, THYROIDAB in the last 72 hours. Anemia Panel: No results for input(s): VITAMINB12, FOLATE, FERRITIN, TIBC,  IRON, RETICCTPCT in the last 72 hours. Sepsis Labs: Recent Labs  Lab 10/28/21 0147  LATICACIDVEN 1.4    Recent Results (from the past 240 hour(s))  Urine Culture     Status: Abnormal   Collection Time: 10/25/21  7:34 PM   Specimen: Urine, Clean Catch  Result Value Ref Range Status   Specimen Description URINE, CLEAN CATCH  Final   Special Requests NONE  Final   Culture (A)  Final    40,000 COLONIES/mL GROUP B STREP(S.AGALACTIAE)ISOLATED TESTING AGAINST S. AGALACTIAE NOT ROUTINELY PERFORMED DUE TO PREDICTABILITY OF AMP/PEN/VAN SUSCEPTIBILITY. Performed at [facility], [place] [address] [place], [place] [address]    Report Status 10/27/2021 FINAL  Final  Culture, blood (Routine x 2)     Status: None (Preliminary result)   Collection Time: 10/27/21  1:40 AM   Specimen: BLOOD  Result Value Ref Range Status   Specimen Description BLOOD RAC  Final   Special Requests   Final    BOTTLES DRAWN AEROBIC AND ANAEROBIC Blood Culture adequate volume   Culture   Final    NO GROWTH 3 DAYS Performed at [facility], [address] [place], [place] [address]    Report Status PENDING  Incomplete  Culture, blood (Routine x 2)     Status: None (Preliminary result)   Collection Time: 10/27/21  1:47 AM   Specimen: BLOOD  Result Value Ref Range Status   Specimen Description BLOOD RHAND  Final   Special Requests   Final    BOTTLES DRAWN AEROBIC AND ANAEROBIC Blood Culture adequate volume   Culture   Final    NO GROWTH 3 DAYS Performed at [facility], [address] [place], [place] [address]    Report Status PENDING  Incomplete  Resp Panel by RT-PCR (Flu A&B, Covid) Nasopharyngeal Swab     Status: None   Collection Time: 10/28/21  7:46 AM   Specimen: Nasopharyngeal Swab; Nasopharyngeal(NP) swabs in vial transport medium  Result Value Ref Range Status   SARS Coronavirus 2 by RT PCR NEGATIVE NEGATIVE Final    Comment: (NOTE) SARS-CoV-2 target nucleic acids are NOT  DETECTED.  The SARS-CoV-2 RNA is generally detectable in upper respiratory specimens during the acute phase of infection. The lowest concentration of SARS-CoV-2 viral copies this assay can detect is 138 copies/mL. A negative result does not preclude SARS-Cov-2 infection and should not be used as the sole basis for treatment or other patient management decisions. A negative result may occur with  improper specimen collection/handling, submission of specimen other than nasopharyngeal swab, presence of viral mutation(s) within the areas targeted by this assay, and inadequate number of viral copies(<138 copies/mL). A negative result  must be combined with clinical observations, patient history, and epidemiological information. The expected result is Negative.  Fact Sheet for Patients:  EntrepreneurPulse.com.au  Fact Sheet for Healthcare Providers:  IncredibleEmployment.be  This test is no t yet approved or cleared by the [place] FDA and  has been authorized for detection and/or diagnosis of SARS-CoV-2 by FDA under an Emergency Use Authorization (EUA). This EUA will remain  in effect (meaning this test can be used) for the duration of the COVID-19 declaration under Section 564(b)(1) of the Act, 21 U.S.C.section 360bbb-3(b)(1), unless the authorization is terminated  or revoked sooner.       Influenza A by PCR NEGATIVE NEGATIVE Final   Influenza B by PCR NEGATIVE NEGATIVE Final    Comment: (NOTE) The Xpert Xpress SARS-CoV-2/FLU/RSV plus assay is intended as an aid in the diagnosis of influenza from Nasopharyngeal swab specimens and should not be used as a sole basis for treatment. Nasal washings and aspirates are unacceptable for Xpert Xpress SARS-CoV-2/FLU/RSV testing.  Fact Sheet for Patients: EntrepreneurPulse.com.au  Fact Sheet for Healthcare Providers: IncredibleEmployment.be  This test is not yet  approved or cleared by the [place] FDA and has been authorized for detection and/or diagnosis of SARS-CoV-2 by FDA under an Emergency Use Authorization (EUA). This EUA will remain in effect (meaning this test can be used) for the duration of the COVID-19 declaration under Section 564(b)(1) of the Act, 21 U.S.C. section 360bbb-3(b)(1), unless the authorization is terminated or revoked.  Performed at [facility], [address] [place], [place] [address]   Urine Culture     Status: Abnormal   Collection Time: 10/28/21  8:08 AM   Specimen: Urine, Clean Catch  Result Value Ref Range Status   Specimen Description   Final    URINE, CLEAN CATCH Performed at [facility], [address], [place], [place] [address]    Special Requests   Final    NONE Performed at [facility], [address] [place], [place] [address]    Culture MULTIPLE SPECIES PRESENT, SUGGEST RECOLLECTION (A)  Final   Report Status 10/29/2021 FINAL  Final  Group A Strep by PCR ([place] Only)     Status: None   Collection Time: 10/28/21  9:40 AM   Specimen: Throat; Sterile Swab  Result Value Ref Range Status   Group A Strep by PCR NOT DETECTED NOT DETECTED Final    Comment: Performed at [facility], [address], [place], [place] [address]         Radiology Studies: No results found.      Scheduled Meds:  enoxaparin (LOVENOX) injection  40 mg Subcutaneous Q24H   insulin aspart  0-15 Units Subcutaneous TID WC   insulin aspart  0-5 Units Subcutaneous QHS   living well with diabetes book   Does not apply Once   pantoprazole (PROTONIX) IV  40 mg Intravenous Q24H   Continuous Infusions:  cefTRIAXone (ROCEPHIN)  IV 200 mL/hr at 10/31/21 0619   promethazine (PHENERGAN) injection (IM or IVPB) Stopped (10/31/21 0545)     LOS: 2 days    Time spent: 20 mins     Wyvonnia Dusky, MD [facility] Pager [phone]  If 7PM-7AM, please contact  night-coverage 10/31/2021, 8:25 AM

## 2021-11-01 DIAGNOSIS — R112 Nausea with vomiting, unspecified: Secondary | ICD-10-CM | POA: Diagnosis not present

## 2021-11-01 DIAGNOSIS — N390 Urinary tract infection, site not specified: Secondary | ICD-10-CM | POA: Diagnosis not present

## 2021-11-01 DIAGNOSIS — E1169 Type 2 diabetes mellitus with other specified complication: Secondary | ICD-10-CM | POA: Diagnosis not present

## 2021-11-01 LAB — BASIC METABOLIC PANEL
Anion gap: 6 (ref 5–15)
BUN: 5 mg/dL — ABNORMAL LOW (ref 6–20)
CO2: 27 mmol/L (ref 22–32)
Calcium: 8.3 mg/dL — ABNORMAL LOW (ref 8.9–10.3)
Chloride: 102 mmol/L (ref 98–111)
Creatinine, Ser: 0.36 mg/dL — ABNORMAL LOW (ref 0.44–1.00)
GFR, Estimated: >60 mL/min (ref >60)
Glucose, Bld: 137 mg/dL — ABNORMAL HIGH (ref 70–99)
Potassium: 3.7 mmol/L (ref 3.5–5.1)
Sodium: 135 mmol/L (ref 135–145)

## 2021-11-01 LAB — CBC
HCT: 32.1 % — ABNORMAL LOW (ref 36.0–46.0)
Hemoglobin: 11 g/dL — ABNORMAL LOW (ref 12.0–15.0)
MCH: 30.6 pg (ref 26.0–34.0)
MCHC: 34.3 g/dL (ref 30.0–36.0)
MCV: 89.4 fL (ref 80.0–100.0)
Platelets: 344 10*3/uL (ref 150–400)
RBC: 3.59 MIL/uL — ABNORMAL LOW (ref 3.87–5.11)
RDW: 11.9 % (ref 11.5–15.5)
WBC: 5.5 10*3/uL (ref 4.0–10.5)
nRBC: 0 % (ref 0.0–0.2)

## 2021-11-01 LAB — GLUCOSE, CAPILLARY
Glucose-Capillary: 106 mg/dL — ABNORMAL HIGH (ref 70–99)
Glucose-Capillary: 107 mg/dL — ABNORMAL HIGH (ref 70–99)
Glucose-Capillary: 135 mg/dL — ABNORMAL HIGH (ref 70–99)

## 2021-11-01 LAB — MAGNESIUM: Magnesium: 1.9 mg/dL (ref 1.7–2.4)

## 2021-11-01 MED ORDER — SCOPOLAMINE 1 MG/3DAYS TD PT72
1.0000 | MEDICATED_PATCH | TRANSDERMAL | Status: DC
  Administered 2021-11-01: 1.5 mg via TRANSDERMAL
  Filled 2021-11-01: qty 1

## 2021-11-01 MED ORDER — SODIUM CHLORIDE (PF) 0.9 % IJ SOLN
INTRAMUSCULAR | Status: AC
  Administered 2021-11-01: 10 mL
  Filled 2021-11-01: qty 10

## 2021-11-01 NOTE — Progress Notes (Signed)
Pt called RN to room for headache and needing assistance to bathroom; pt sitting on side of bed; pt c/o of throbbing headache 9/10 pain; pt currently dizzy and lightheaded; pt very nauseous; pt also felt the urge to void; RN and tech assisted pt to bedside commode due to pt feeling like she couldn't get to bathroom; pt's head extremely throbbing; pt nauseated and dizzy while sitting on bedside commode; VS taken; pt has BP of 149/95 and all other VS WNL; pt has no blurry vision, no ringing in ears; Rn contacted pharmacy to get phenergan; pt can't have zofran and tylenol until 0540; by 0540 no iv phenergan from pharmacy yet; pt took tylenol and zofran PO and immediately threw up; Rn got iv phenergan at 0553 and gave to pt; at 0600 pt still having headache but now reporting 8/10 pain and nausea is getting better; this same episode occurred about 24 hours ago as well

## 2021-11-01 NOTE — Progress Notes (Signed)
PROGRESS NOTE    Linda Carlson  JSE:[id number] DOB: 01-17-1981 DOA: 10/28/2021 PCP: Ria Bush, MD   Assessment & Plan:   Principal Problem:   UTI (urinary tract infection) Active Problems:   Diabetes mellitus without complication ([place])   Hypokalemia   Sepsis ([place])   Sepsis: POA. Met criteria w/ fever, tachycardia & UTI. Continue on IV abxs. Sepsis resolved    UTI: w/ possible pyelonephritis. Dysuria and frequency and also has right-sided CVA tenderness. Continue on IV rocephin x 5 days. Repeat urine cx shows no growth    DM2: HbA1c 10.5, poorly controlled. Continue on SSI w/ accuchecks    Hypokalemia: within normal limits   Nausea & vomiting: likely due to acute infection. Zofran, phenergan prn. W/ nausea and vomiting again overnight. Scopolamine patch ordered    DVT prophylaxis: lovenox  Code Status:  full  Family Communication:  Disposition Plan: likely d/c back home   Level of care: Med-Surg  Status is: Inpatient  Remains inpatient appropriate because: severity of illness   Consultants:   Procedures:   Antimicrobials: rocephin   Subjective: Pt c/o nausea & vomiting  Objective: Vitals:   10/31/21 2321 10/31/21 2330 11/01/21 0457 11/01/21 0741  BP: 136/89  (!) 149/95 132/80  Pulse: 89  94 86  Resp: 18  20 16   Temp: 98.7 F (37.1 C)  97.8 F (36.6 C) 98.5 F (36.9 C)  TempSrc: Oral  Oral Oral  SpO2:  98% 100% 99%  Weight:      Height:        Intake/Output Summary (Last 24 hours) at 11/01/2021 0817 Last data filed at 11/01/2021 0400 Gross per 24 hour  Intake --  Output 1600 ml  Net -1600 ml   Filed Weights   10/27/21 2324  Weight: 77.1 kg    Examination:  General exam: Appears uncomfortable Respiratory system: clear breath sounds b/l Cardiovascular system: S1/S2+. No rubs or clicks  Gastrointestinal system: Abd is soft, NT, ND & normal bowel sounds  Central nervous system: Alert and oriented. Moves all extremities   Psychiatry: Judgement and insight appears normal. Flat mood and affect     Data Reviewed: I have personally reviewed following labs and imaging studies  CBC: Recent Labs  Lab 10/28/21 0141 10/29/21 0636 10/30/21 0407 10/31/21 0558 11/01/21 0441  WBC 7.7 6.1 5.9 7.4 5.5  NEUTROABS 5.9  --   --   --   --   HGB 13.5 10.0* 10.4* 10.4* 11.0*  HCT 37.6 29.0* 29.0* 31.0* 32.1*  MCV 87.9 88.1 88.7 88.8 89.4  PLT 378 295 275 330 073   Basic Metabolic Panel: Recent Labs  Lab 10/28/21 0141 10/28/21 1320 10/29/21 0636 10/30/21 0407 10/31/21 0558 11/01/21 0441  NA 139  --  137 138 136 135  K 3.4*  --  3.8 3.7 3.8 3.7  CL 102  --  107 109 103 102  CO2 24  --  22 22 25 27   GLUCOSE 225*  --  123* 170* 92 137*  BUN 15  --  11 6 <5* 5*  CREATININE 0.53  --  0.43* 0.38* 0.48 0.36*  CALCIUM 9.0  --  7.7* 7.8* 7.9* 8.3*  MG  --  1.9  --  1.8 1.7 1.9   GFR: Estimated Creatinine Clearance: 95 mL/min (A) (by C-G formula based on SCr of 0.36 mg/dL (L)). Liver Function Tests: Recent Labs  Lab 10/28/21 0141  AST 15  ALT 30  ALKPHOS 48  BILITOT 1.0  PROT  8.9*  ALBUMIN 3.9   No results for input(s): LIPASE, AMYLASE in the last 168 hours. No results for input(s): AMMONIA in the last 168 hours. Coagulation Profile: Recent Labs  Lab 10/28/21 0141  INR 1.1   Cardiac Enzymes: No results for input(s): CKTOTAL, CKMB, CKMBINDEX, TROPONINI in the last 168 hours. BNP (last 3 results) No results for input(s): PROBNP in the last 8760 hours. HbA1C: No results for input(s): HGBA1C in the last 72 hours.  CBG: Recent Labs  Lab 10/31/21 0844 10/31/21 1118 10/31/21 1651 10/31/21 2219 11/01/21 0539  GLUCAP 95 155* 148* 160* 135*   Lipid Profile: No results for input(s): CHOL, HDL, LDLCALC, TRIG, CHOLHDL, LDLDIRECT in the last 72 hours. Thyroid Function Tests: No results for input(s): TSH, T4TOTAL, FREET4, T3FREE, THYROIDAB in the last 72 hours. Anemia Panel: No results for  input(s): VITAMINB12, FOLATE, FERRITIN, TIBC, IRON, RETICCTPCT in the last 72 hours. Sepsis Labs: Recent Labs  Lab 10/28/21 0147  LATICACIDVEN 1.4    Recent Results (from the past 240 hour(s))  Urine Culture     Status: Abnormal   Collection Time: 10/25/21  7:34 PM   Specimen: Urine, Clean Catch  Result Value Ref Range Status   Specimen Description URINE, CLEAN CATCH  Final   Special Requests NONE  Final   Culture (A)  Final    40,000 COLONIES/mL GROUP B STREP(S.AGALACTIAE)ISOLATED TESTING AGAINST S. AGALACTIAE NOT ROUTINELY PERFORMED DUE TO PREDICTABILITY OF AMP/PEN/VAN SUSCEPTIBILITY. Performed at [facility], [place] [address], [place], [place] [address]    Report Status 10/27/2021 FINAL  Final  Culture, blood (Routine x 2)     Status: None (Preliminary result)   Collection Time: 10/27/21  1:40 AM   Specimen: BLOOD  Result Value Ref Range Status   Specimen Description BLOOD RAC  Final   Special Requests   Final    BOTTLES DRAWN AEROBIC AND ANAEROBIC Blood Culture adequate volume   Culture   Final    NO GROWTH 4 DAYS Performed at [facility], [address] [place], [place] [address]    Report Status PENDING  Incomplete  Culture, blood (Routine x 2)     Status: None (Preliminary result)   Collection Time: 10/27/21  1:47 AM   Specimen: BLOOD  Result Value Ref Range Status   Specimen Description BLOOD RHAND  Final   Special Requests   Final    BOTTLES DRAWN AEROBIC AND ANAEROBIC Blood Culture adequate volume   Culture   Final    NO GROWTH 4 DAYS Performed at [facility], [address] [place], [place] [address]    Report Status PENDING  Incomplete  Resp Panel by RT-PCR (Flu A&B, Covid) Nasopharyngeal Swab     Status: None   Collection Time: 10/28/21  7:46 AM   Specimen: Nasopharyngeal Swab; Nasopharyngeal(NP) swabs in vial transport medium  Result Value Ref Range Status   SARS Coronavirus 2 by RT PCR NEGATIVE NEGATIVE Final    Comment:  (NOTE) SARS-CoV-2 target nucleic acids are NOT DETECTED.  The SARS-CoV-2 RNA is generally detectable in upper respiratory specimens during the acute phase of infection. The lowest concentration of SARS-CoV-2 viral copies this assay can detect is 138 copies/mL. A negative result does not preclude SARS-Cov-2 infection and should not be used as the sole basis for treatment or other patient management decisions. A negative result may occur with  improper specimen collection/handling, submission of specimen other than nasopharyngeal swab, presence of viral mutation(s) within the areas  targeted by this assay, and inadequate number of viral copies(<138 copies/mL). A negative result must be combined with clinical observations, patient history, and epidemiological information. The expected result is Negative.  Fact Sheet for Patients:  EntrepreneurPulse.com.au  Fact Sheet for Healthcare Providers:  IncredibleEmployment.be  This test is no t yet approved or cleared by the [place] FDA and  has been authorized for detection and/or diagnosis of SARS-CoV-2 by FDA under an Emergency Use Authorization (EUA). This EUA will remain  in effect (meaning this test can be used) for the duration of the COVID-19 declaration under Section 564(b)(1) of the Act, 21 U.S.C.section 360bbb-3(b)(1), unless the authorization is terminated  or revoked sooner.       Influenza A by PCR NEGATIVE NEGATIVE Final   Influenza B by PCR NEGATIVE NEGATIVE Final    Comment: (NOTE) The Xpert Xpress SARS-CoV-2/FLU/RSV plus assay is intended as an aid in the diagnosis of influenza from Nasopharyngeal swab specimens and should not be used as a sole basis for treatment. Nasal washings and aspirates are unacceptable for Xpert Xpress SARS-CoV-2/FLU/RSV testing.  Fact Sheet for Patients: EntrepreneurPulse.com.au  Fact Sheet for Healthcare  Providers: IncredibleEmployment.be  This test is not yet approved or cleared by the [place] FDA and has been authorized for detection and/or diagnosis of SARS-CoV-2 by FDA under an Emergency Use Authorization (EUA). This EUA will remain in effect (meaning this test can be used) for the duration of the COVID-19 declaration under Section 564(b)(1) of the Act, 21 U.S.C. section 360bbb-3(b)(1), unless the authorization is terminated or revoked.  Performed at [facility], [address] [place], [place] [address]   Urine Culture     Status: Abnormal   Collection Time: 10/28/21  8:08 AM   Specimen: Urine, Clean Catch  Result Value Ref Range Status   Specimen Description   Final    URINE, CLEAN CATCH Performed at [facility], [address] [place], [place] [address]    Special Requests   Final    NONE Performed at [facility], [address] [place], [place] [address]    Culture MULTIPLE SPECIES PRESENT, SUGGEST RECOLLECTION (A)  Final   Report Status 10/29/2021 FINAL  Final  Group A Strep by PCR ([place] Only)     Status: None   Collection Time: 10/28/21  9:40 AM   Specimen: Throat; Sterile Swab  Result Value Ref Range Status   Group A Strep by PCR NOT DETECTED NOT DETECTED Final    Comment: Performed at [facility], [address], [place], [place] [address]  Urine Culture     Status: None   Collection Time: 10/30/21  3:49 PM   Specimen: Urine, Clean Catch  Result Value Ref Range Status   Specimen Description   Final    URINE, CLEAN CATCH Performed at [facility], [address], [place], [place] [address]    Special Requests   Final    NONE Performed at [facility], [address], [place], [place] [address]    Culture   Final    NO GROWTH Performed at [facility], [place] [address], [place], [place] [address]    Report Status 10/31/2021 FINAL  Final         Radiology  Studies: No results found.      Scheduled Meds:  enoxaparin (LOVENOX) injection  40 mg Subcutaneous Q24H   insulin aspart  0-15 Units Subcutaneous TID WC   insulin aspart  0-5  Units Subcutaneous QHS   living well with diabetes book   Does not apply Once   pantoprazole (PROTONIX) IV  40 mg Intravenous Q24H   scopolamine  1 patch Transdermal Q72H   Continuous Infusions:  cefTRIAXone (ROCEPHIN)  IV 2 g (10/31/21 0853)   promethazine (PHENERGAN) injection (IM or IVPB) 25 mg (11/01/21 0553)     LOS: 3 days    Time spent: 15 mins     Wyvonnia Dusky, MD [facility] Pager [phone]  If 7PM-7AM, please contact night-coverage 11/01/2021, 8:17 AM

## 2021-11-02 DIAGNOSIS — E876 Hypokalemia: Secondary | ICD-10-CM

## 2021-11-02 LAB — CBC
HCT: 34.2 % — ABNORMAL LOW (ref 36.0–46.0)
Hemoglobin: 11.7 g/dL — ABNORMAL LOW (ref 12.0–15.0)
MCH: 30.2 pg (ref 26.0–34.0)
MCHC: 34.2 g/dL (ref 30.0–36.0)
MCV: 88.4 fL (ref 80.0–100.0)
Platelets: 396 10*3/uL (ref 150–400)
RBC: 3.87 MIL/uL (ref 3.87–5.11)
RDW: 11.9 % (ref 11.5–15.5)
WBC: 6.8 10*3/uL (ref 4.0–10.5)
nRBC: 0 % (ref 0.0–0.2)

## 2021-11-02 LAB — BASIC METABOLIC PANEL
Anion gap: 8 (ref 5–15)
BUN: 6 mg/dL (ref 6–20)
CO2: 27 mmol/L (ref 22–32)
Calcium: 8.4 mg/dL — ABNORMAL LOW (ref 8.9–10.3)
Chloride: 100 mmol/L (ref 98–111)
Creatinine, Ser: 0.4 mg/dL — ABNORMAL LOW (ref 0.44–1.00)
GFR, Estimated: >60 mL/min (ref >60)
Glucose, Bld: 139 mg/dL — ABNORMAL HIGH (ref 70–99)
Potassium: 3.4 mmol/L — ABNORMAL LOW (ref 3.5–5.1)
Sodium: 135 mmol/L (ref 135–145)

## 2021-11-02 LAB — CULTURE, BLOOD (ROUTINE X 2)
Culture: NO GROWTH
Culture: NO GROWTH
Special Requests: ADEQUATE
Special Requests: ADEQUATE

## 2021-11-02 LAB — MAGNESIUM: Magnesium: 2 mg/dL (ref 1.7–2.4)

## 2021-11-02 LAB — GLUCOSE, CAPILLARY: Glucose-Capillary: 113 mg/dL — ABNORMAL HIGH (ref 70–99)

## 2021-11-02 MED ORDER — CIPROFLOXACIN HCL 500 MG PO TABS: 500.0000 mg | ORAL_TABLET | Freq: Two times a day (BID) | ORAL | 0 refills | Status: AC

## 2021-11-02 MED ORDER — POTASSIUM CHLORIDE CRYS ER 20 MEQ PO TBCR
20.0000 meq | EXTENDED_RELEASE_TABLET | Freq: Once | ORAL | Status: AC
  Administered 2021-11-02: 20 meq via ORAL
  Filled 2021-11-02: qty 1

## 2021-11-02 MED ORDER — ONDANSETRON HCL 4 MG PO TABS: 4.0000 mg | ORAL_TABLET | Freq: Three times a day (TID) | ORAL | 0 refills | Status: AC | PRN

## 2021-11-02 NOTE — Discharge Summary (Signed)
Physician Discharge Summary  Linda Carlson OIL:[id number] DOB: June 28, 1981 DOA: 10/28/2021  PCP: Ria Bush, MD  Admit date: 10/28/2021 Discharge date: 11/02/2021  Admitted From: home  Disposition:  home   Recommendations for Outpatient Follow-up:  Follow up with PCP in 1-2 weeks   Home Health: no  Equipment/Devices:  Discharge Condition: stable  CODE STATUS: full  Diet recommendation: Carb Modified  Brief/Interim Summary: HPI was taken from Dr. Francine Graven: Linda Carlson is a with a history of with medical history significant for diabetes mellitus who presents to the ER for evaluation of 2-day history of fever, dysuria, nausea, vomiting and low back pain. Patient states that she has had symptoms for about a week and initially thought she had a yeast infection and used Monistat without any improvement in her symptoms. She then went to an urgent care center and was told she had a UTI and was placed on Macrobid.  For the last 2 days she has had nausea, vomiting, inability to tolerate any oral intake, fever, chills as well as pain in her lower abdomen mostly right lower quadrant as well as both lumbar areas.. She denies having any chest pain, no shortness of breath, no dizziness, no palpitations, no headache, no changes in her bowel habits, no lower extremity swelling, no blurred vision, no focal deficits, no orthopnea, no paroxysmal nocturnal dyspnea. Labs show sodium 139, potassium 3.4, chloride 102, bicarb 24, glucose 225, BUN 15, creatinine 0.53, calcium 9.0, alkaline phosphatase 48, albumin 3.9, AST 15, ALT 30, total protein 8.9, total bilirubin 1.0, lactic acid 1.4, white count 7.7, hemoglobin 13.5, hematocrit 37.6, MCV 87.9, RDW 11.7, platelet count 370, PT 13.8, INR 1.1 Urine pregnancy test is negative Respiratory viral panel is negative Chest x-ray reviewed by me shows no evidence of acute cardiopulmonary disease CT scan of abdomen and pelvis shows no acute findings.  Hepatic  steatosis.  Simple appearing left-sided pelvic cyst measuring up to 4.2 cm and having the appearance of a physiologic left ovarian cyst.       ED Course: Patient is a 40 year old female with a history of diabetes mellitus who presents to the emergency room for evaluation of a 2-day history of nausea, vomiting and inability to tolerate any oral intake.  She has had a several day history of urinary frequency and dysuria and was treated as an outpatient with Macrobid. She presents to the emergency room with a T-max of 101.24F and was tachycardic.   She continues to have pyuria and has right-sided CVA tenderness. She received a dose of IV Rocephin in the ER but is still unable to tolerate oral intake. She will be admitted to the hospital for further evaluation.   Hospital course from Dr. Jimmye Norman 11/2-11/5/22: Pt presented w/ sepsis secondary to likely UTI w/ possibly pyelonephritis. Pt was treated w/ IV rocephin and then switched to po cipro at d/c. Initial urine cx grew likely containment and repeat urine cx had no growth. Of note, pt did have a couple of days of nausea/vomiting that improved w/ zofran and phenergan.     Discharge Diagnoses:  Principal Problem:   UTI (urinary tract infection) Active Problems:   Diabetes mellitus without complication ([place])   Hypokalemia   Sepsis ([place])  Sepsis: POA. Met criteria w/ fever, tachycardia & UTI. Continue on IV abxs. Sepsis resolved    UTI: w/ possible pyelonephritis. Dysuria and frequency and also has right-sided CVA tenderness. Continue on IV rocephin x 7 days. Repeat urine cx shows no growth  DM2: HbA1c 10.5, poorly controlled. Continue on SSI w/ accuchecks    Hypokalemia: encourage po intake    Nausea & vomiting: likely due to acute infection. Zofran, phenergan prn. W/ nausea and vomiting again overnight. Scopolamine patch ordered    Discharge Instructions  Discharge Instructions     Diet Carb Modified   Complete by: As directed     Discharge instructions   Complete by: As directed    F/u w/ PCP in 1-2 weeks   Increase activity slowly   Complete by: As directed       Allergies as of 11/02/2021   No Known Allergies      Medication List     STOP taking these medications    nitrofurantoin (macrocrystal-monohydrate) 100 MG capsule Commonly known as: MACROBID       TAKE these medications    ciprofloxacin 500 MG tablet Commonly known as: Cipro Take 1 tablet (500 mg total) by mouth 2 (two) times daily for 2 days.   glimepiride 1 MG tablet Commonly known as: Amaryl Take 1 tablet (1 mg total) by mouth daily with breakfast.   metFORMIN 500 MG tablet Commonly known as: GLUCOPHAGE Take 500 mg by mouth 2 (two) times daily.   MULTIVITAMIN PO Take 1 tablet by mouth daily.   ondansetron 4 MG tablet Commonly known as: Zofran Take 1 tablet (4 mg total) by mouth every 8 (eight) hours as needed for up to 5 days for nausea or vomiting.        No Known Allergies  Consultations:    Procedures/Studies: DG Chest 2 View  Result Date: 10/28/2021 CLINICAL DATA:  Dysuria EXAM: CHEST - 2 VIEW COMPARISON:  None. FINDINGS: Lungs are clear.  No pleural effusion or pneumothorax. The heart is normal in size. Visualized osseous structures are within normal limits. IMPRESSION: Normal chest radiographs. Electronically Signed   By: Julian Hy M.D.   On: 10/28/2021 00:11   CT ABDOMEN PELVIS W CONTRAST  Result Date: 10/28/2021 CLINICAL DATA:  Nausea, vomiting, lower abdominal pain, right-sided lower back pain, dysuria and fever. EXAM: CT ABDOMEN AND PELVIS WITH CONTRAST TECHNIQUE: Multidetector CT imaging of the abdomen and pelvis was performed using the standard protocol following bolus administration of intravenous contrast. CONTRAST:  161m OMNIPAQUE IOHEXOL 300 MG/ML  SOLN COMPARISON:  Abdominal ultrasound on 04/28/2013 FINDINGS: Lower chest: No acute abnormality. Hepatobiliary: The liver demonstrates steatosis  without focal lesion or biliary dilatation. The gallbladder is unremarkable. Extrahepatic bile ducts are nondilated. Pancreas: Unremarkable. No pancreatic ductal dilatation or surrounding inflammatory changes. Spleen: Normal in size without focal abnormality. Adrenals/Urinary Tract: Adrenal glands are unremarkable. Kidneys demonstrate no evidence of hydronephrosis, masses or calculi. There are some tiny renal cysts bilaterally which have a benign appearance. The bladder is unremarkable. Stomach/Bowel: Bowel shows no evidence of obstruction, ileus, inflammation or lesion. No free intraperitoneal air identified. The appendix is normal. Vascular/Lymphatic: No significant vascular findings are present. No enlarged abdominal or pelvic lymph nodes. Reproductive: Uterus appears unremarkable. There is a simple appearing left-sided pelvic cyst measuring approximately 4.2 x 3.3 cm and having the appearance a probable physiologic left ovarian cyst. Other: No abdominal wall hernia or abnormality. No abdominopelvic ascites. Musculoskeletal: No acute or significant osseous findings. IMPRESSION: 1. No acute findings in the abdomen or pelvis. 2. Hepatic steatosis. 3. Simple appearing left-sided pelvic cyst measuring up to 4.2 cm and having the appearance of a physiologic left ovarian cyst. 4.2 cm left ovarian simple-appearing cyst. No follow-up imaging is recommended. Reference: JBernette Redbird  2020 Feb;17(2):248-254 Electronically Signed   By: Aletta Edouard M.D.   On: 10/28/2021 08:40   (Echo, Carotid, EGD, Colonoscopy, ERCP)    Subjective: Pt c/o fatigue    Discharge Exam: Vitals:   11/02/21 0432 11/02/21 0739  BP: (!) 142/81 (!) 137/93  Pulse: 92 90  Resp: 18 20  Temp: 98.5 F (36.9 C) 98.6 F (37 C)  SpO2: 98% 95%   Vitals:   11/01/21 2020 11/02/21 0052 11/02/21 0432 11/02/21 0739  BP: 139/79 138/78 (!) 142/81 (!) 137/93  Pulse: (!) 101 92 92 90  Resp: _0 Temp: 98.5 F (36.9 C) 98.4 F (36.9 C) 98.5 F  (36.9 C) 98.6 F (37 C)  TempSrc: Oral Oral Oral Oral  SpO2: 99% 98% 98% 95%  Weight:      Height:        General: Pt is alert, awake, not in acute distress Cardiovascular: S1/S2 +, no rubs, no gallops Respiratory: CTA bilaterally, no wheezing, no rhonchi Abdominal: Soft, NT, ND, bowel sounds + Extremities: no edema, no cyanosis    The results of significant diagnostics from this hospitalization (including imaging, microbiology, ancillary and laboratory) are listed below for reference.     Microbiology: Recent Results (from the past 240 hour(s))  Urine Culture     Status: Abnormal   Collection Time: 10/25/21  7:34 PM   Specimen: Urine, Clean Catch  Result Value Ref Range Status   Specimen Description URINE, CLEAN CATCH  Final   Special Requests NONE  Final   Culture (A)  Final    40,000 COLONIES/mL GROUP B STREP(S.AGALACTIAE)ISOLATED TESTING AGAINST S. AGALACTIAE NOT ROUTINELY PERFORMED DUE TO PREDICTABILITY OF AMP/PEN/VAN SUSCEPTIBILITY. Performed at [facility], [place] [address], [place], [place] [address]    Report Status 10/27/2021 FINAL  Final  Culture, blood (Routine x 2)     Status: None   Collection Time: 10/27/21  1:40 AM   Specimen: BLOOD  Result Value Ref Range Status   Specimen Description BLOOD RAC  Final   Special Requests   Final    BOTTLES DRAWN AEROBIC AND ANAEROBIC Blood Culture adequate volume   Culture   Final    NO GROWTH 5 DAYS Performed at [facility], [address] [place], [place] [address]    Report Status 11/02/2021 FINAL  Final  Culture, blood (Routine x 2)     Status: None   Collection Time: 10/27/21  1:47 AM   Specimen: BLOOD  Result Value Ref Range Status   Specimen Description BLOOD RHAND  Final   Special Requests   Final    BOTTLES DRAWN AEROBIC AND ANAEROBIC Blood Culture adequate volume   Culture   Final    NO GROWTH 5 DAYS Performed at [facility], [address] [place], [place] [address]     Report Status 11/02/2021 FINAL  Final  Resp Panel by RT-PCR (Flu A&B, Covid) Nasopharyngeal Swab     Status: None   Collection Time: 10/28/21  7:46 AM   Specimen: Nasopharyngeal Swab; Nasopharyngeal(NP) swabs in vial transport medium  Result Value Ref Range Status   SARS Coronavirus 2 by RT PCR NEGATIVE NEGATIVE Final    Comment: (NOTE) SARS-CoV-2 target nucleic acids are NOT DETECTED.  The SARS-CoV-2 RNA is generally detectable in upper respiratory specimens during the acute phase of infection. The lowest concentration of SARS-CoV-2 viral copies this assay can detect is 138 copies/mL. A negative result does not preclude SARS-Cov-2 infection and  should not be used as the sole basis for treatment or other patient management decisions. A negative result may occur with  improper specimen collection/handling, submission of specimen other than nasopharyngeal swab, presence of viral mutation(s) within the areas targeted by this assay, and inadequate number of viral copies(<138 copies/mL). A negative result must be combined with clinical observations, patient history, and epidemiological information. The expected result is Negative.  Fact Sheet for Patients:  EntrepreneurPulse.com.au  Fact Sheet for Healthcare Providers:  IncredibleEmployment.be  This test is no t yet approved or cleared by the [place] FDA and  has been authorized for detection and/or diagnosis of SARS-CoV-2 by FDA under an Emergency Use Authorization (EUA). This EUA will remain  in effect (meaning this test can be used) for the duration of the COVID-19 declaration under Section 564(b)(1) of the Act, 21 U.S.C.section 360bbb-3(b)(1), unless the authorization is terminated  or revoked sooner.       Influenza A by PCR NEGATIVE NEGATIVE Final   Influenza B by PCR NEGATIVE NEGATIVE Final    Comment: (NOTE) The Xpert Xpress SARS-CoV-2/FLU/RSV plus assay is intended as an aid in  the diagnosis of influenza from Nasopharyngeal swab specimens and should not be used as a sole basis for treatment. Nasal washings and aspirates are unacceptable for Xpert Xpress SARS-CoV-2/FLU/RSV testing.  Fact Sheet for Patients: EntrepreneurPulse.com.au  Fact Sheet for Healthcare Providers: IncredibleEmployment.be  This test is not yet approved or cleared by the [place] FDA and has been authorized for detection and/or diagnosis of SARS-CoV-2 by FDA under an Emergency Use Authorization (EUA). This EUA will remain in effect (meaning this test can be used) for the duration of the COVID-19 declaration under Section 564(b)(1) of the Act, 21 U.S.C. section 360bbb-3(b)(1), unless the authorization is terminated or revoked.  Performed at [facility], [address] [place], [place] [address]   Urine Culture     Status: Abnormal   Collection Time: 10/28/21  8:08 AM   Specimen: Urine, Clean Catch  Result Value Ref Range Status   Specimen Description   Final    URINE, CLEAN CATCH Performed at [facility], [address], [place], [place] [address]    Special Requests   Final    NONE Performed at [facility], [address] [place], [place] [address]    Culture MULTIPLE SPECIES PRESENT, SUGGEST RECOLLECTION (A)  Final   Report Status 10/29/2021 FINAL  Final  Group A Strep by PCR ([place] Only)     Status: None   Collection Time: 10/28/21  9:40 AM   Specimen: Throat; Sterile Swab  Result Value Ref Range Status   Group A Strep by PCR NOT DETECTED NOT DETECTED Final    Comment: Performed at [facility], [address] [place], [place] [address]  Urine Culture     Status: None   Collection Time: 10/30/21  3:49 PM   Specimen: Urine, Clean Catch  Result Value Ref Range Status   Specimen Description   Final    URINE, CLEAN CATCH Performed at [facility], [address] [place],  [place] [address]    Special Requests   Final    NONE Performed at [facility], [address], [place], [place] [address]    Culture   Final    NO GROWTH Performed at [facility], [place] [address], [place], [place] [address]    Report Status 10/31/2021 FINAL  Final     Labs: BNP (  last 3 results) No results for input(s): BNP in the last 8760 hours. Basic Metabolic Panel: Recent Labs  Lab 10/28/21 1320 10/29/21 0636 10/30/21 0407 10/31/21 0558 11/01/21 0441 11/02/21 0557  NA  --  137 138 136 135 135  K  --  3.8 3.7 3.8 3.7 3.4*  CL  --  107 109 103 102 100  CO2  --  _0 GLUCOSE  --  123* 170* 92 137* 139*  BUN  --  11 6 <5* 5* 6  CREATININE  --  0.43* 0.38* 0.48 0.36* 0.40*  CALCIUM  --  7.7* 7.8* 7.9* 8.3* 8.4*  MG 1.9  --  1.8 1.7 1.9 2.0   Liver Function Tests: Recent Labs  Lab 10/28/21 0141  AST 15  ALT 30  ALKPHOS 48  BILITOT 1.0  PROT 8.9*  ALBUMIN 3.9   No results for input(s): LIPASE, AMYLASE in the last 168 hours. No results for input(s): AMMONIA in the last 168 hours. CBC: Recent Labs  Lab 10/28/21 0141 10/29/21 0636 10/30/21 0407 10/31/21 0558 11/01/21 0441 11/02/21 0557  WBC 7.7 6.1 5.9 7.4 5.5 6.8  NEUTROABS 5.9  --   --   --   --   --   HGB 13.5 10.0* 10.4* 10.4* 11.0* 11.7*  HCT 37.6 29.0* 29.0* 31.0* 32.1* 34.2*  MCV 87.9 88.1 88.7 88.8 89.4 88.4  PLT 378 295 275 330 344 396   Cardiac Enzymes: No results for input(s): CKTOTAL, CKMB, CKMBINDEX, TROPONINI in the last 168 hours. BNP: Invalid input(s): POCBNP CBG: Recent Labs  Lab 10/31/21 2219 11/01/21 0539 11/01/21 1451 11/01/21 2333 11/02/21 0741  GLUCAP 160* 135* 107* 106* 113*   D-Dimer No results for input(s): DDIMER in the last 72 hours. Hgb A1c No results for input(s): HGBA1C in the last 72 hours. Lipid Profile No results for input(s): CHOL, HDL, LDLCALC, TRIG, CHOLHDL, LDLDIRECT in the last 72 hours. Thyroid function studies No results for  input(s): TSH, T4TOTAL, T3FREE, THYROIDAB in the last 72 hours.  Invalid input(s): FREET3 Anemia work up No results for input(s): VITAMINB12, FOLATE, FERRITIN, TIBC, IRON, RETICCTPCT in the last 72 hours. Urinalysis    Component Value Date/Time   COLORURINE YELLOW (A) 10/30/2021 1549   APPEARANCEUR CLOUDY (A) 10/30/2021 1549   LABSPEC 1.014 10/30/2021 1549   PHURINE 6.0 10/30/2021 1549   GLUCOSEU 150 (A) 10/30/2021 1549   HGBUR LARGE (A) 10/30/2021 1549   BILIRUBINUR NEGATIVE 10/30/2021 1549   BILIRUBINUR small (A) 10/25/2021 1932   KETONESUR 5 (A) 10/30/2021 1549   PROTEINUR NEGATIVE 10/30/2021 1549   UROBILINOGEN 0.2 10/25/2021 1932   NITRITE NEGATIVE 10/30/2021 1549   LEUKOCYTESUR LARGE (A) 10/30/2021 1549   Sepsis Labs Invalid input(s): PROCALCITONIN,  WBC,  LACTICIDVEN Microbiology Recent Results (from the past 240 hour(s))  Urine Culture     Status: Abnormal   Collection Time: 10/25/21  7:34 PM   Specimen: Urine, Clean Catch  Result Value Ref Range Status   Specimen Description URINE, CLEAN CATCH  Final   Special Requests NONE  Final   Culture (A)  Final    40,000 COLONIES/mL GROUP B STREP(S.AGALACTIAE)ISOLATED TESTING AGAINST S. AGALACTIAE NOT ROUTINELY PERFORMED DUE TO PREDICTABILITY OF AMP/PEN/VAN SUSCEPTIBILITY. Performed at [facility], [place] [address], [place], [place] [address]    Report Status 10/27/2021 FINAL  Final  Culture, blood (Routine x 2)     Status: None   Collection Time: 10/27/21  1:40 AM  Specimen: BLOOD  Result Value Ref Range Status   Specimen Description BLOOD RAC  Final   Special Requests   Final    BOTTLES DRAWN AEROBIC AND ANAEROBIC Blood Culture adequate volume   Culture   Final    NO GROWTH 5 DAYS Performed at [facility], [address], [place], [place] [address]    Report Status 11/02/2021 FINAL  Final  Culture, blood (Routine x 2)     Status: None   Collection Time: 10/27/21  1:47 AM   Specimen: BLOOD  Result  Value Ref Range Status   Specimen Description BLOOD RHAND  Final   Special Requests   Final    BOTTLES DRAWN AEROBIC AND ANAEROBIC Blood Culture adequate volume   Culture   Final    NO GROWTH 5 DAYS Performed at [facility], [address] [place], [place] [address]    Report Status 11/02/2021 FINAL  Final  Resp Panel by RT-PCR (Flu A&B, Covid) Nasopharyngeal Swab     Status: None   Collection Time: 10/28/21  7:46 AM   Specimen: Nasopharyngeal Swab; Nasopharyngeal(NP) swabs in vial transport medium  Result Value Ref Range Status   SARS Coronavirus 2 by RT PCR NEGATIVE NEGATIVE Final    Comment: (NOTE) SARS-CoV-2 target nucleic acids are NOT DETECTED.  The SARS-CoV-2 RNA is generally detectable in upper respiratory specimens during the acute phase of infection. The lowest concentration of SARS-CoV-2 viral copies this assay can detect is 138 copies/mL. A negative result does not preclude SARS-Cov-2 infection and should not be used as the sole basis for treatment or other patient management decisions. A negative result may occur with  improper specimen collection/handling, submission of specimen other than nasopharyngeal swab, presence of viral mutation(s) within the areas targeted by this assay, and inadequate number of viral copies(<138 copies/mL). A negative result must be combined with clinical observations, patient history, and epidemiological information. The expected result is Negative.  Fact Sheet for Patients:  EntrepreneurPulse.com.au  Fact Sheet for Healthcare Providers:  IncredibleEmployment.be  This test is no t yet approved or cleared by the [place] FDA and  has been authorized for detection and/or diagnosis of SARS-CoV-2 by FDA under an Emergency Use Authorization (EUA). This EUA will remain  in effect (meaning this test can be used) for the duration of the COVID-19 declaration under Section 564(b)(1) of the Act,  21 U.S.C.section 360bbb-3(b)(1), unless the authorization is terminated  or revoked sooner.       Influenza A by PCR NEGATIVE NEGATIVE Final   Influenza B by PCR NEGATIVE NEGATIVE Final    Comment: (NOTE) The Xpert Xpress SARS-CoV-2/FLU/RSV plus assay is intended as an aid in the diagnosis of influenza from Nasopharyngeal swab specimens and should not be used as a sole basis for treatment. Nasal washings and aspirates are unacceptable for Xpert Xpress SARS-CoV-2/FLU/RSV testing.  Fact Sheet for Patients: EntrepreneurPulse.com.au  Fact Sheet for Healthcare Providers: IncredibleEmployment.be  This test is not yet approved or cleared by the [place] FDA and has been authorized for detection and/or diagnosis of SARS-CoV-2 by FDA under an Emergency Use Authorization (EUA). This EUA will remain in effect (meaning this test can be used) for the duration of the COVID-19 declaration under Section 564(b)(1) of the Act, 21 U.S.C. section 360bbb-3(b)(1), unless the authorization is terminated or revoked.  Performed at [facility], [address], [place], [place] [address]   Urine Culture     Status: Abnormal   Collection Time: 10/28/21  8:08 AM   Specimen: Urine, Clean Catch  Result Value Ref Range Status   Specimen Description   Final    URINE, CLEAN CATCH Performed at [facility], [address] [place], [place] [address]    Special Requests   Final    NONE Performed at [facility], [address] [place], [place] [address]    Culture MULTIPLE SPECIES PRESENT, SUGGEST RECOLLECTION (A)  Final   Report Status 10/29/2021 FINAL  Final  Group A Strep by PCR ([place] Only)     Status: None   Collection Time: 10/28/21  9:40 AM   Specimen: Throat; Sterile Swab  Result Value Ref Range Status   Group A Strep by PCR NOT DETECTED NOT DETECTED Final    Comment: Performed at [facility], [address], [place], [place] [address]  Urine Culture     Status: None   Collection Time: 10/30/21  3:49 PM   Specimen: Urine, Clean Catch  Result Value Ref Range Status   Specimen Description   Final    URINE, CLEAN CATCH Performed at [facility], [address], [place], [place] [address]    Special Requests   Final    NONE Performed at [facility], [address], [place], [place] [address]    Culture   Final    NO GROWTH Performed at [facility], [place] [address], [place], [place] [address]    Report Status 10/31/2021 FINAL  Final     Time coordinating discharge: Over 30 minutes  SIGNED:   Wyvonnia Dusky, MD  [facility] 11/02/2021, 11:20 AM Pager   If 7PM-7AM, please contact night-coverage

## 2021-11-02 NOTE — Progress Notes (Signed)
Patient discharged home with family.  Discharge instructions, when to follow up, and prescriptions reviewed with patient.  Patient verbalized understanding. Patient escorted out by auxiliary.

## 2021-11-02 NOTE — Discharge Instructions (Signed)
Notify your provider if fever or flank pain returns, if you have dizziness or lightheadedness, 3 or more watery stools within 24 hour period while on antibiotics.

## 2021-11-04 ENCOUNTER — Telehealth: Payer: Self-pay

## 2021-11-04 NOTE — Telephone Encounter (Signed)
Transition Care Management Unsuccessful Follow-up Telephone Call  Date of discharge and from where:  [facility] on 11/02/21  Attempts:  1st Attempt  Reason for unsuccessful TCM follow-up call:  Left voice message

## 2021-11-05 ENCOUNTER — Telehealth: Payer: Self-pay

## 2021-11-05 NOTE — Telephone Encounter (Signed)
Transition Care Management Unsuccessful Follow-up Telephone Call  Date of discharge and from where:  11/02/21 from [facility]  Attempts:  2nd Attempt  Reason for unsuccessful TCM follow-up call:  Left voice message

## 2021-11-08 ENCOUNTER — Telehealth: Payer: Self-pay

## 2021-11-08 NOTE — Telephone Encounter (Signed)
Transition Care Management Unsuccessful Follow-up Telephone Call  Date of discharge and from where:  [facility] 10/28/21-11/02/21  Attempts:  1st Attempt  Reason for unsuccessful TCM follow-up call:  Left voice message  Jodelle Gross, RN, BSN, CCM Care Management Coordinator Phone: [phone]/Fax: [phone]

## 2022-03-13 IMAGING — CT CT ABD-PELV W/ CM
2 of 5 series · 15 of 46 positions shown, 17 images · IV contrast (omnipaque)
Comparison: Abdominal ultrasound on 04/28/2013

CLINICAL DATA: Nausea, vomiting, lower abdominal pain, right-sided
lower back pain, dysuria and fever.

EXAM:
CT ABDOMEN AND PELVIS WITH CONTRAST
TECHNIQUE: Multidetector CT imaging of the abdomen and pelvis was performed
using the standard protocol following bolus administration of
intravenous contrast.
CONTRAST:  100mL OMNIPAQUE IOHEXOL 300 MG/ML  SOLN

[Series 2: routine abd/pel with · axial · 0.73mm/px · z∈[-1068,-624]mm · 12 of 101 slices shown, 14 images]
[im 6/101  soft-tissue]
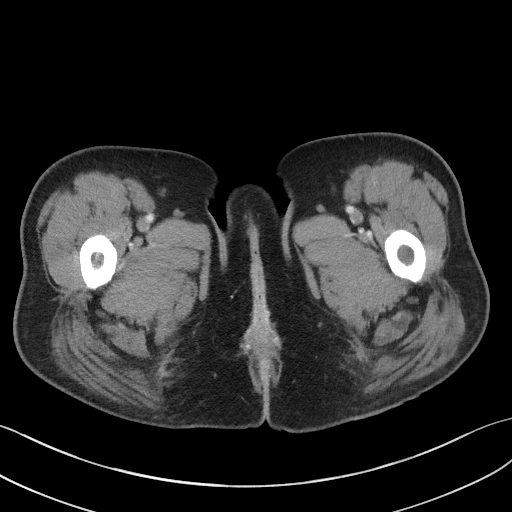
[im 6/101  bone]
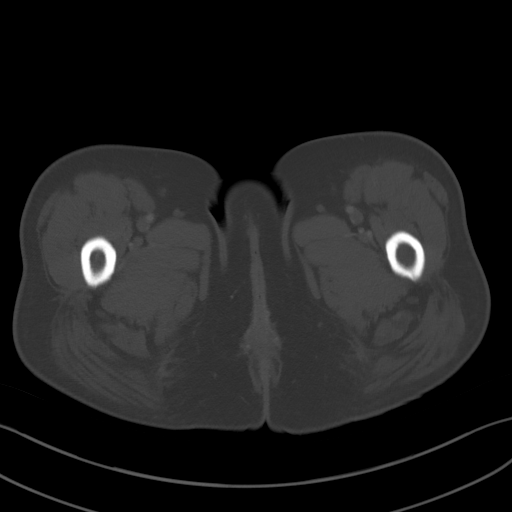
[im 16/101  soft-tissue]
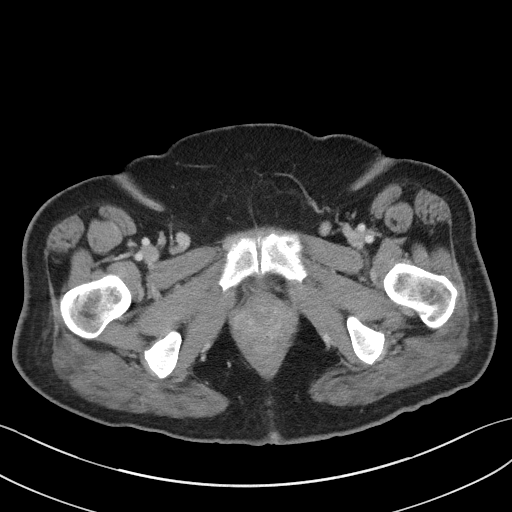
[im 22/101  soft-tissue]
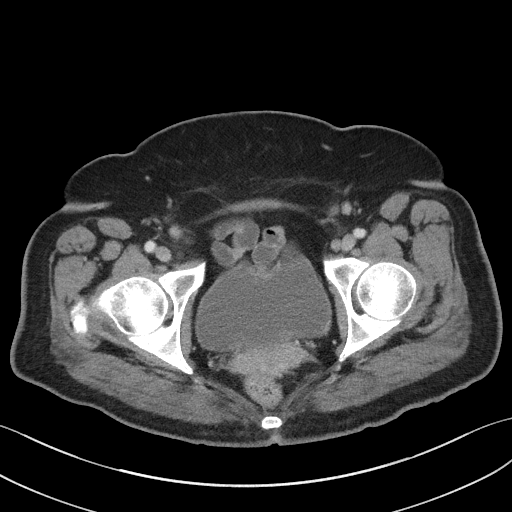
[im 32/101  soft-tissue]
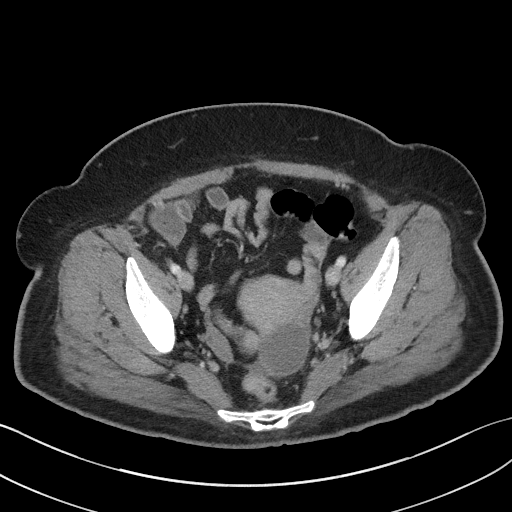
[im 37/101  soft-tissue]
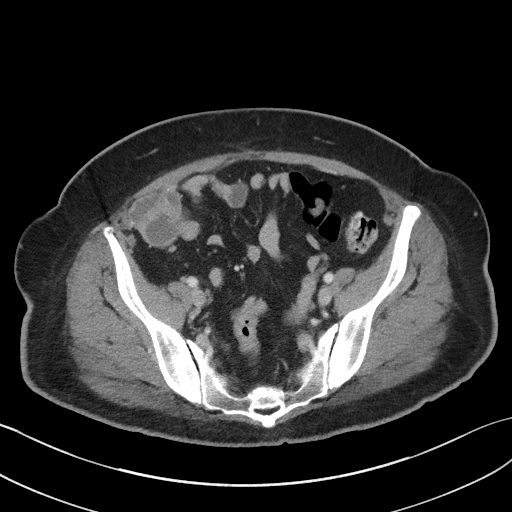
[im 48/101  soft-tissue]
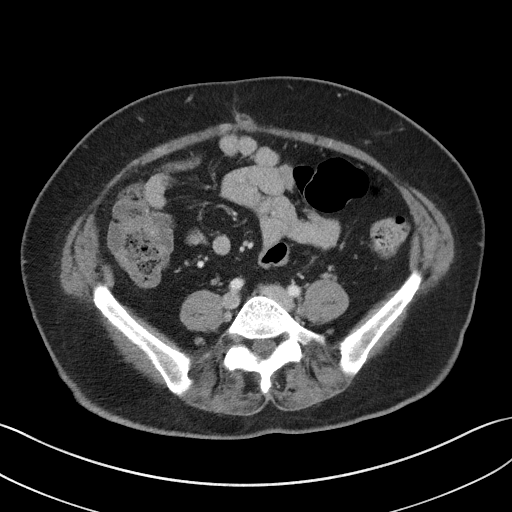
[im 53/101  soft-tissue]
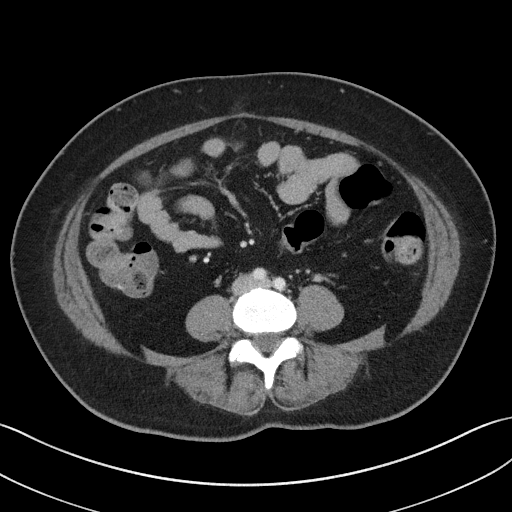
[im 64/101  soft-tissue]
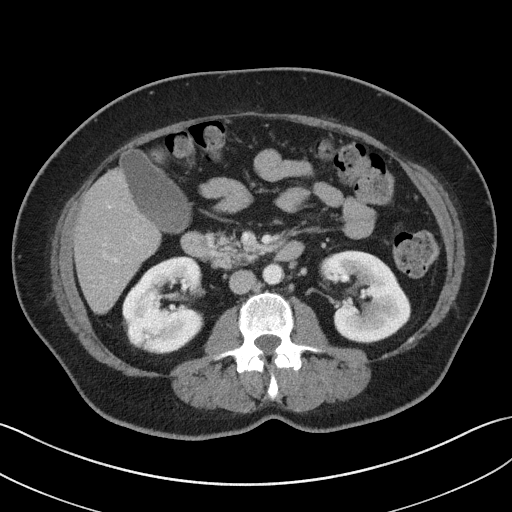
[im 69/101  soft-tissue]
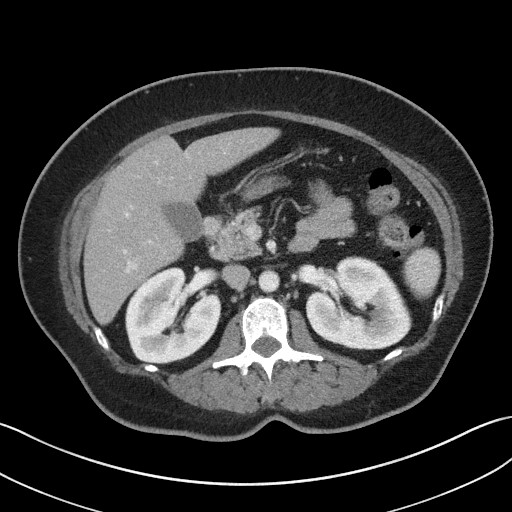
[im 69/101  bone]
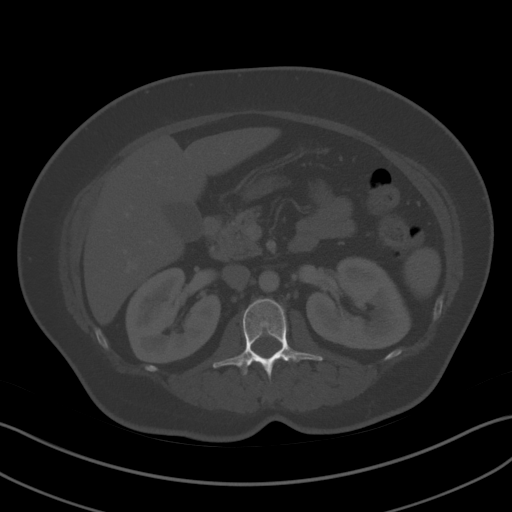
[im 79/101  soft-tissue]
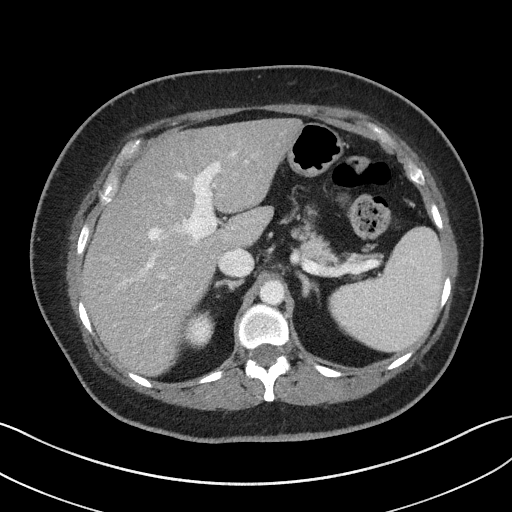
[im 85/101  soft-tissue]
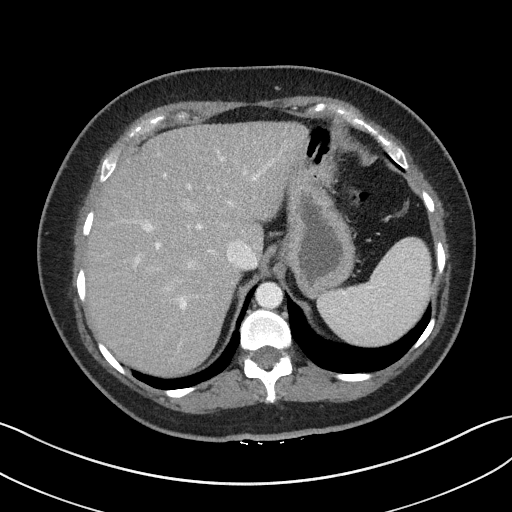
[im 95/101  soft-tissue]
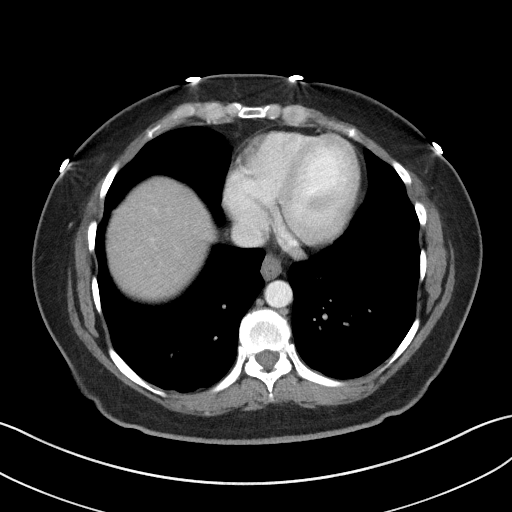

[Series 5: coronal st · coronal · 0.72mm/px · 3 of 95 slices shown]
[im 32/95  soft-tissue]
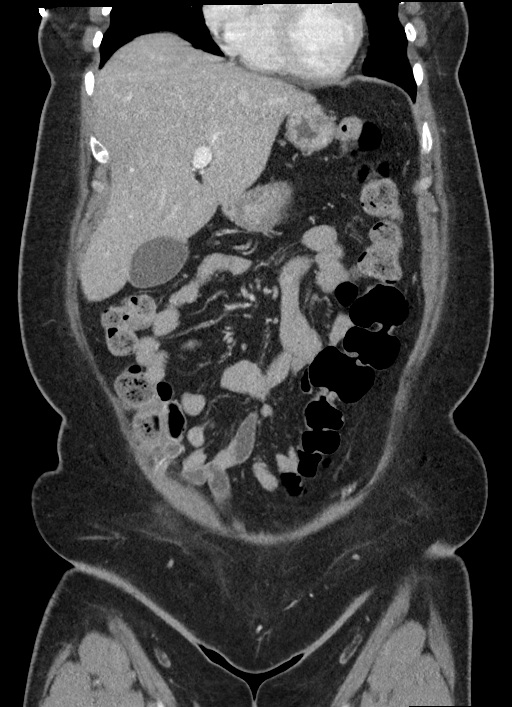
[im 42/95  soft-tissue]
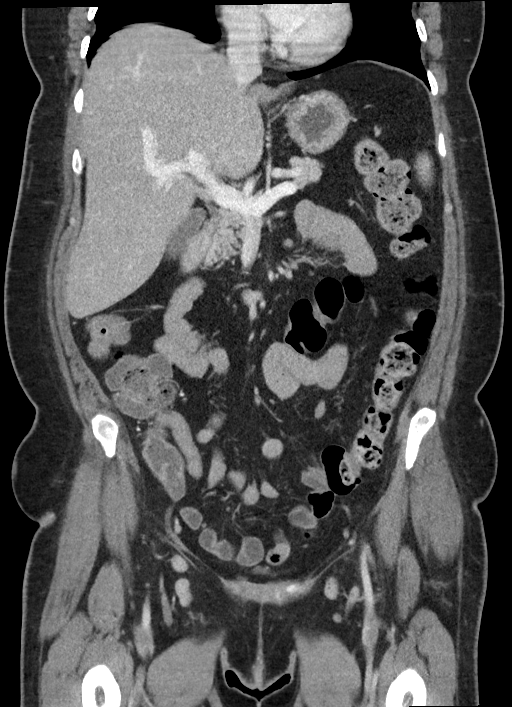
[im 53/95  soft-tissue]
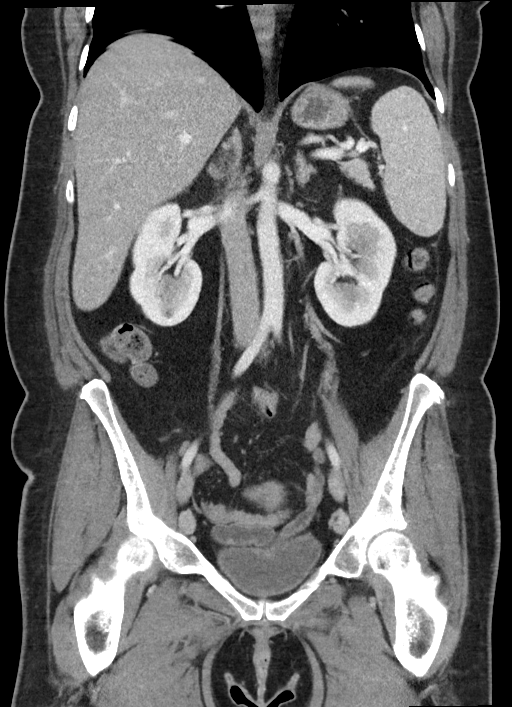

[15 of 46 positions shown; findings below may reference images not displayed]

FINDINGS: Lower chest: No acute abnormality.

Hepatobiliary: The liver demonstrates steatosis without focal lesion
or biliary dilatation. The gallbladder is unremarkable. Extrahepatic
bile ducts are nondilated.

Pancreas: Unremarkable. No pancreatic ductal dilatation or
surrounding inflammatory changes.

Spleen: Normal in size without focal abnormality.

Adrenals/Urinary Tract: Adrenal glands are unremarkable. Kidneys
demonstrate no evidence of hydronephrosis, masses or calculi. There
are some tiny renal cysts bilaterally which have a benign
appearance. The bladder is unremarkable.

Stomach/Bowel: Bowel shows no evidence of obstruction, ileus,
inflammation or lesion. No free intraperitoneal air identified. The
appendix is normal.

Vascular/Lymphatic: No significant vascular findings are present. No
enlarged abdominal or pelvic lymph nodes.

Reproductive: Uterus appears unremarkable. There is a simple
appearing left-sided pelvic cyst measuring approximately 4.2 x
cm and having the appearance a probable physiologic left ovarian
cyst.

Other: No abdominal wall hernia or abnormality. No abdominopelvic
ascites.

Musculoskeletal: No acute or significant osseous findings.
IMPRESSION: 1. No acute findings in the abdomen or pelvis.
2. Hepatic steatosis.
3. Simple appearing left-sided pelvic cyst measuring up to 4.2 cm
and having the appearance of a physiologic left ovarian cyst.
4.2 cm left ovarian simple-appearing cyst. No follow-up imaging is
recommended.
Reference: JACR [DATE]):[id number]

## 2022-07-23 ENCOUNTER — Ambulatory Visit
Admission: EM | Admit: 2022-07-23 | Discharge: 2022-07-23 | Disposition: A | Discharge status: Home / Self Care | Payer: Medicaid Other | Attending: Physician Assistant | Admitting: Physician Assistant

## 2022-07-23 DIAGNOSIS — N3000 Acute cystitis without hematuria: Secondary | ICD-10-CM

## 2022-07-23 LAB — POCT URINALYSIS DIP (MANUAL ENTRY)
Bilirubin, UA: NEGATIVE
Glucose, UA: >=1000 mg/dL — AB
Leukocytes, UA: NEGATIVE
Nitrite, UA: NEGATIVE
Spec Grav, UA: 1.015 (ref 1.010–1.025)
Urobilinogen, UA: 0.2 E.U./dL
pH, UA: 6 (ref 5.0–8.0)

## 2022-07-23 MED ORDER — NITROFURANTOIN MONOHYD MACRO 100 MG PO CAPS: 100.0000 mg | ORAL_CAPSULE | Freq: Two times a day (BID) | ORAL | 0 refills | Status: DC

## 2022-07-23 NOTE — ED Triage Notes (Signed)
Pt c/o frequency, dysuria, new lower back pain. Onset ~ 2 days ago.

## 2022-07-23 NOTE — ED Provider Notes (Signed)
[facility]    CSN: [id number] Arrival date & time: 07/23/22  1042      History   Chief Complaint Chief Complaint  Patient presents with   Dysuria    HPI Linda Carlson is a 41 y.o. female.   Patient here today for evaluation of possible UTI.  She reports that she has had dysuria for the last few days.  She has had multiple UTIs in the past and states this is similar.  She has had some back pain as well.  She denies any new abdominal pain but states she has cramping from her menstrual period.  She denies any fever but states overall she does not feel well today.  The history is provided by the patient.    Past Medical History:  Diagnosis Date   ANEMIA, HX OF 03/14/2011   Qualifier: Diagnosis of  By: Sharen Hones  MD, Javier     COVID-19 virus infection 03/2020   Diabetes mellitus without complication (HCC)    DIABETES MELLITUS, GESTATIONAL, HX OF 03/14/2011   Qualifier: Diagnosis of  By: Sharen Hones  MD, Wynona Canes     Fatty liver    Seasonal allergic rhinitis     Patient Active Problem List   Diagnosis Date Noted   Sepsis (HCC) 10/29/2021   UTI (urinary tract infection) 10/28/2021   Diabetes mellitus without complication (HCC)    Hypokalemia    Adjustment disorder 01/31/2013   Diabetes mellitus type 2, uncontrolled, 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      Home Medications    Prior to Admission medications   Medication Sig Start Date End Date Taking? Authorizing Provider  nitrofurantoin, macrocrystal-monohydrate, (MACROBID) 100 MG capsule Take 1 capsule (100 mg total) by mouth 2 (two) times daily. 07/23/22  Yes Tomi Bamberger, PA-C  glimepiride (AMARYL) 1 MG tablet Take 1 tablet (1 mg total) by mouth daily with breakfast. Patient not taking: No sig reported 07/26/20   Eustaquio Boyden,  MD  metFORMIN (GLUCOPHAGE) 500 MG tablet Take 500 mg by mouth 2 (two) times daily. Patient not taking: Reported on 10/28/2021    [provider]  metFORMIN (GLUCOPHAGE) 500 MG tablet Take 1 tablet by mouth 2 (two) times daily.    [provider]  Multiple Vitamins-Minerals (MULTIVITAMIN PO) Take 1 tablet by mouth daily. Patient not taking: Reported on 10/28/2021    [provider]    Family History Family History  Problem Relation Age of Onset   Hypertension Mother     Social History Social History   Tobacco Use   Smoking status: Never   Smokeless tobacco: Never  Substance Use Topics   Alcohol use: No   Drug use: No     Allergies   Patient has no known allergies.   Review of Systems Review of Systems  Constitutional:  Negative for chills and fever.  Respiratory:  Negative for shortness of breath.   Gastrointestinal:  Positive for abdominal pain. Negative for nausea and vomiting.  Genitourinary:  Positive for dysuria, flank pain and frequency.     Physical Exam Triage Vital Signs ED Triage Vitals  Enc Vitals Group     BP      Pulse      Resp      Temp      Temp src  SpO2      Weight      Height      Head Circumference      Peak Flow      Pain Score      Pain Loc      Pain Edu?      Excl. in GC?    No data found.  Updated Vital Signs BP (!) 160/90 (BP Location: Right Arm)   Pulse 95   Temp 98 F (36.7 C) (Oral)   Resp 18   SpO2 98%      Physical Exam Vitals and nursing note reviewed.  Constitutional:      General: She is not in acute distress.    Appearance: Normal appearance. She is not ill-appearing.  HENT:     Head: Normocephalic and atraumatic.     Nose: Nose normal.  Cardiovascular:     Rate and Rhythm: Normal rate and regular rhythm.     Heart sounds: Normal heart sounds. No murmur heard. Pulmonary:     Effort: Pulmonary effort is normal. No respiratory distress.     Breath sounds: Normal breath  sounds. No wheezing, rhonchi or rales.  Abdominal:     General: Abdomen is flat.  Skin:    General: Skin is warm and dry.  Neurological:     Mental Status: She is alert.  Psychiatric:        Mood and Affect: Mood normal.        Thought Content: Thought content normal.      UC Treatments / Results  Labs (all labs ordered are listed, but only abnormal results are displayed) Labs Reviewed  POCT URINALYSIS DIP (MANUAL ENTRY) - Abnormal; Notable for the following components:      Result Value   Color, UA colorless (*)    Clarity, UA cloudy (*)    Glucose, UA >=1,000 (*)    Ketones, POC UA trace (5) (*)    Blood, UA moderate (*)    Protein Ur, POC trace (*)    All other components within normal limits  URINE CULTURE    EKG   Radiology No results found.  Procedures Procedures (including critical care time)  Medications Ordered in UC Medications - No data to display  Initial Impression / Assessment and Plan / UC Course  I have reviewed the triage vital signs and the nursing notes.  Pertinent labs & imaging results that were available during my care of the patient were reviewed by me and considered in my medical decision making (see chart for details).    UA with negative LE and nitrites, however given history will treat to cover UTI.  Urine culture ordered.  Will await results for further recommendation.  Encouraged follow-up if symptoms fail to improve or worsen.  Final Clinical Impressions(s) / UC Diagnoses   Final diagnoses:  Acute cystitis without hematuria   Discharge Instructions   None    ED Prescriptions     Medication Sig Dispense Auth. Provider   nitrofurantoin, macrocrystal-monohydrate, (MACROBID) 100 MG capsule Take 1 capsule (100 mg total) by mouth 2 (two) times daily. 10 capsule Tomi Bamberger, PA-C      PDMP not reviewed this encounter.   Tomi Bamberger, PA-C 07/23/22 1143

## 2022-07-25 LAB — URINE CULTURE: Culture: 50000 — AB

## 2022-07-30 ENCOUNTER — Other Ambulatory Visit: Payer: Self-pay

## 2022-07-30 ENCOUNTER — Emergency Department (HOSPITAL_COMMUNITY): Payer: Medicaid Other

## 2022-07-30 ENCOUNTER — Ambulatory Visit
Admission: EM | Admit: 2022-07-30 | Discharge: 2022-07-30 | Disposition: A | Discharge status: Home / Self Care | Payer: Medicaid Other | Attending: Internal Medicine | Admitting: Internal Medicine

## 2022-07-30 ENCOUNTER — Encounter: Payer: Self-pay | Admitting: Emergency Medicine

## 2022-07-30 ENCOUNTER — Encounter (HOSPITAL_COMMUNITY): Payer: Self-pay

## 2022-07-30 ENCOUNTER — Emergency Department (HOSPITAL_COMMUNITY)
Admission: EM | Admit: 2022-07-30 | Discharge: 2022-07-31 | Disposition: A | Discharge status: Home / Self Care | Payer: Medicaid Other | Attending: Emergency Medicine | Admitting: Emergency Medicine

## 2022-07-30 DIAGNOSIS — R002 Palpitations: Secondary | ICD-10-CM

## 2022-07-30 DIAGNOSIS — E119 Type 2 diabetes mellitus without complications: Secondary | ICD-10-CM | POA: Diagnosis not present

## 2022-07-30 DIAGNOSIS — Z7984 Long term (current) use of oral hypoglycemic drugs: Secondary | ICD-10-CM | POA: Insufficient documentation

## 2022-07-30 DIAGNOSIS — R1030 Lower abdominal pain, unspecified: Secondary | ICD-10-CM | POA: Insufficient documentation

## 2022-07-30 DIAGNOSIS — R110 Nausea: Secondary | ICD-10-CM

## 2022-07-30 DIAGNOSIS — M549 Dorsalgia, unspecified: Secondary | ICD-10-CM

## 2022-07-30 DIAGNOSIS — R319 Hematuria, unspecified: Secondary | ICD-10-CM | POA: Diagnosis present

## 2022-07-30 DIAGNOSIS — M7989 Other specified soft tissue disorders: Secondary | ICD-10-CM | POA: Insufficient documentation

## 2022-07-30 DIAGNOSIS — R030 Elevated blood-pressure reading, without diagnosis of hypertension: Secondary | ICD-10-CM | POA: Diagnosis present

## 2022-07-30 DIAGNOSIS — R519 Headache, unspecified: Secondary | ICD-10-CM

## 2022-07-30 DIAGNOSIS — N9489 Other specified conditions associated with female genital organs and menstrual cycle: Secondary | ICD-10-CM | POA: Insufficient documentation

## 2022-07-30 DIAGNOSIS — N3001 Acute cystitis with hematuria: Secondary | ICD-10-CM | POA: Insufficient documentation

## 2022-07-30 LAB — I-STAT CHEM 8, ED
BUN: 3 mg/dL — ABNORMAL LOW (ref 6–20)
Calcium, Ion: 1.19 mmol/L (ref 1.15–1.40)
Chloride: 101 mmol/L (ref 98–111)
Creatinine, Ser: 0.3 mg/dL — ABNORMAL LOW (ref 0.44–1.00)
Glucose, Bld: 216 mg/dL — ABNORMAL HIGH (ref 70–99)
HCT: 40 % (ref 36.0–46.0)
Hemoglobin: 13.6 g/dL (ref 12.0–15.0)
Potassium: 3.6 mmol/L (ref 3.5–5.1)
Sodium: 139 mmol/L (ref 135–145)
TCO2: 24 mmol/L (ref 22–32)

## 2022-07-30 LAB — CBC WITH DIFFERENTIAL/PLATELET
Abs Immature Granulocytes: 0.02 10*3/uL (ref 0.00–0.07)
Basophils Absolute: 0 10*3/uL (ref 0.0–0.1)
Basophils Relative: 1 %
Eosinophils Absolute: 0 10*3/uL (ref 0.0–0.5)
Eosinophils Relative: 0 %
HCT: 39.1 % (ref 36.0–46.0)
Hemoglobin: 13.4 g/dL (ref 12.0–15.0)
Immature Granulocytes: 0 %
Lymphocytes Relative: 17 %
Lymphs Abs: 1 10*3/uL (ref 0.7–4.0)
MCH: 30.4 pg (ref 26.0–34.0)
MCHC: 34.3 g/dL (ref 30.0–36.0)
MCV: 88.7 fL (ref 80.0–100.0)
Monocytes Absolute: 0.4 10*3/uL (ref 0.1–1.0)
Monocytes Relative: 7 %
Neutro Abs: 4.3 10*3/uL (ref 1.7–7.7)
Neutrophils Relative %: 75 %
Platelets: 324 10*3/uL (ref 150–400)
RBC: 4.41 MIL/uL (ref 3.87–5.11)
RDW: 11.9 % (ref 11.5–15.5)
WBC: 5.8 10*3/uL (ref 4.0–10.5)
nRBC: 0 % (ref 0.0–0.2)

## 2022-07-30 LAB — URINALYSIS, ROUTINE W REFLEX MICROSCOPIC
Bilirubin Urine: NEGATIVE
Glucose, UA: >=500 mg/dL — AB
Ketones, ur: 80 mg/dL — AB
Nitrite: NEGATIVE
Protein, ur: 100 mg/dL — AB
RBC / HPF: >50 RBC/hpf — ABNORMAL HIGH (ref 0–5)
Specific Gravity, Urine: 1.026 (ref 1.005–1.030)
WBC, UA: >50 WBC/hpf — ABNORMAL HIGH (ref 0–5)
pH: 6 (ref 5.0–8.0)

## 2022-07-30 LAB — COMPREHENSIVE METABOLIC PANEL
ALT: 32 U/L (ref 0–44)
AST: 24 U/L (ref 15–41)
Albumin: 3.6 g/dL (ref 3.5–5.0)
Alkaline Phosphatase: 40 U/L (ref 38–126)
Anion gap: 9 (ref 5–15)
BUN: 5 mg/dL — ABNORMAL LOW (ref 6–20)
CO2: 24 mmol/L (ref 22–32)
Calcium: 9 mg/dL (ref 8.9–10.3)
Chloride: 104 mmol/L (ref 98–111)
Creatinine, Ser: 0.53 mg/dL (ref 0.44–1.00)
GFR, Estimated: >60 mL/min (ref >60)
Glucose, Bld: 217 mg/dL — ABNORMAL HIGH (ref 70–99)
Potassium: 3.8 mmol/L (ref 3.5–5.1)
Sodium: 137 mmol/L (ref 135–145)
Total Bilirubin: 1.2 mg/dL (ref 0.3–1.2)
Total Protein: 7.4 g/dL (ref 6.5–8.1)

## 2022-07-30 LAB — I-STAT BETA HCG BLOOD, ED (MC, WL, AP ONLY): I-stat hCG, quantitative: <5 m[IU]/mL (ref <5)

## 2022-07-30 LAB — LIPASE, BLOOD: Lipase: 34 U/L (ref 11–51)

## 2022-07-30 LAB — TROPONIN I (HIGH SENSITIVITY): Troponin I (High Sensitivity): 6 ng/L (ref <18)

## 2022-07-30 NOTE — ED Triage Notes (Signed)
Reports going to UC last week for urinary urgency. Then last night pt began experiencing BLE swelling, upper back pain, lower back pain and an anxious sensation.

## 2022-07-30 NOTE — ED Provider Triage Note (Signed)
Emergency Medicine Provider Triage Evaluation Note  Odean Mcelwain , a 41 y.o. female  was evaluated in triage.  Pt complains of lower and upper back pain, palpitations, nausea without vomiting, bilateral leg and feet swelling.  Patient was recently treated with antibiotic for UTI and reports that she still had symptoms.  She reports that she was recently hospitalized in November 2022 for a UTI and "kidney issues".  She denies any fever reports has been taking ibuprofen..  Review of Systems  Positive:  Negative:   Physical Exam  BP 126/84   Pulse (!) 108   Temp 98.4 F (36.9 C) (Oral)   Resp 20   SpO2 100%  Gen:   Awake, no distress   Resp:  Normal effort  MSK:   Moves extremities without difficulty  Other:  Diffuse upper and lower back tenderness.  CVA tenderness bilaterally.  She has some suprapubic tenderness upon palpation.  Abdomen is soft without any guarding or rebound.  Medical Decision Making  Medically screening exam initiated at 7:13 PM.  Appropriate orders placed.  Brytnee Bechler was informed that the remainder of the evaluation will be completed by another provider, this initial triage assessment does not replace that evaluation, and the importance of remaining in the ED until their evaluation is complete.  We will order basic labs and CT imaging.   Achille Rich, [place] 07/30/22 1914

## 2022-07-30 NOTE — ED Provider Notes (Signed)
[facility]    CSN: [id number] Arrival date & time: 07/30/22  1649      History   Chief Complaint Chief Complaint  Patient presents with   Back Pain   Headache   Leg Swelling    HPI Linda Carlson is a 41 y.o. female.   Patient presents with several different chief complaints today that included headache, low back pain, upper back pain, bilateral leg swelling, headache, nausea without vomiting, lower abdominal pain, palpitations.  Patient reports all the symptoms started yesterday.  Patient is currently being treated with Macrobid antibiotic for urinary tract infection after she was seen on 07/23/2022.  She denies any current urinary symptoms that have been persistent.  Denies any associated fever.  Back pain is present in the bilateral back and also extends into the right upper thoracic back.  Denies any obvious injuries to the area.  She reports palpitations that have been intermittent throughout the day as well.  Denies any associated chest pain or shortness of breath.  Denies any pertinent cardiac history.  She also has a headache but denies dizziness or blurred vision.  She has elevated blood pressure reading but reports that she does not take blood pressure medication and has never had an issue with blood pressure in the past.   Back Pain 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   Qualifier: Diagnosis of  By: Sharen Hones  MD, Javier     COVID-19 virus infection 03/2020   Diabetes mellitus without complication (HCC)    DIABETES MELLITUS, GESTATIONAL, HX OF 03/14/2011   Qualifier: Diagnosis of  By: Sharen Hones  MD, Wynona Canes     Fatty liver    Seasonal allergic rhinitis     Patient Active Problem List   Diagnosis Date Noted   Sepsis (HCC) 10/29/2021   UTI (urinary tract infection) 10/28/2021   Diabetes mellitus without complication (HCC)    Hypokalemia    Adjustment disorder 01/31/2013   Diabetes mellitus type 2, uncontrolled,  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      Home Medications    Prior to Admission medications   Medication Sig Start Date End Date Taking? Authorizing Provider  glimepiride (AMARYL) 1 MG tablet Take 1 tablet (1 mg total) by mouth daily with breakfast. Patient not taking: No sig reported 07/26/20   Eustaquio Boyden, MD  metFORMIN (GLUCOPHAGE) 500 MG tablet Take 500 mg by mouth 2 (two) times daily. Patient not taking: Reported on 10/28/2021    [provider]  metFORMIN (GLUCOPHAGE) 500 MG tablet Take 1 tablet by mouth 2 (two) times daily.    [provider]  Multiple Vitamins-Minerals (MULTIVITAMIN PO) Take 1 tablet by mouth daily. Patient not taking: Reported on 10/28/2021    [provider]  nitrofurantoin, macrocrystal-monohydrate, (MACROBID) 100 MG capsule Take 1 capsule (100 mg total) by mouth 2 (two) times daily. 07/23/22   Tomi Bamberger, PA-C    Family History Family History  Problem Relation Age of Onset   Hypertension Mother     Social History Social History   Tobacco Use   Smoking status: Never   Smokeless tobacco: Never  Substance Use Topics   Alcohol use: No   Drug use: No     Allergies   Patient has no  known allergies.   Review of Systems Review of Systems Per HPI  Physical Exam Triage Vital Signs ED Triage Vitals  Enc Vitals Group     BP 07/30/22 1701 (!) 175/111     Pulse Rate 07/30/22 1701 (!) 110     Resp 07/30/22 1701 18     Temp 07/30/22 1701 98.3 F (36.8 C)     Temp src --      SpO2 07/30/22 1701 97 %     Weight --      Height --      Head Circumference --      Peak Flow --      Pain Score 07/30/22 1700 8     Pain Loc --      Pain Edu? --      Excl. in GC? --    No data found.  Updated Vital Signs BP (!) 175/111   Pulse (!) 110    Temp 98.3 F (36.8 C)   Resp 18   SpO2 97%   Visual Acuity Right Eye Distance:   Left Eye Distance:   Bilateral Distance:    Right Eye Near:   Left Eye Near:    Bilateral Near:     Physical Exam Constitutional:      General: She is not in acute distress.    Appearance: Normal appearance. She is ill-appearing. She is not toxic-appearing or diaphoretic.  HENT:     Head: Normocephalic and atraumatic.  Eyes:     Extraocular Movements: Extraocular movements intact.     Conjunctiva/sclera: Conjunctivae normal.     Pupils: Pupils are equal, round, and reactive to light.  Cardiovascular:     Rate and Rhythm: Regular rhythm. Tachycardia present.     Pulses: Normal pulses.     Heart sounds: Normal heart sounds.  Pulmonary:     Effort: Pulmonary effort is normal. No respiratory distress.     Breath sounds: Normal breath sounds.  Abdominal:     General: Bowel sounds are normal. There is no distension.     Palpations: Abdomen is soft.     Tenderness: There is abdominal tenderness in the right upper quadrant and suprapubic area.  Musculoskeletal:       Back:     Comments: Tenderness to palpation to circled area on diagram as well as across lower lumbar region.  No direct spinal tenderness, crepitus, step-off noted.  Skin:    Comments: Mild nonpitting edema to  bilateral feet and lower legs.  No obvious discoloration noted.  No pain to palpation.  Capillary refill and pulses normal.  Neurological:     General: No focal deficit present.     Mental Status: She is alert and oriented to person, place, and time. Mental status is at baseline.     Cranial Nerves: Cranial nerves 2-12 are intact.     Sensory: Sensation is intact.     Motor: Motor function is intact.     Coordination: Coordination is intact.     Gait: Gait is intact.     Deep Tendon Reflexes: Reflexes are normal and symmetric.  Psychiatric:        Mood and Affect: Mood normal.        Behavior: Behavior normal.         Thought Content: Thought content normal.        Judgment: Judgment normal.      UC Treatments / Results  Labs (all labs ordered are listed, but only abnormal results  are displayed) Labs Reviewed - No data to display  EKG   Radiology No results found.  Procedures Procedures (including critical care time)  Medications Ordered in UC Medications - No data to display  Initial Impression / Assessment and Plan / UC Course  I have reviewed the triage vital signs and the nursing notes.  Pertinent labs & imaging results that were available during my care of the patient were reviewed by me and considered in my medical decision making (see chart for details).     EKG was completed that showed sinus tachycardia.  Given significantly elevated blood pressure reading, tachycardia, and associated pedal edema, headache, nausea, vomiting, palpitations, I do think this warrants further evaluation ad management at the hospital.  Patient advised to go to the ER for further evaluation and management and EMS transport was suggested.  Patient declined EMS transport.  Risks associated with not going by EMS were discussed with patient.  Patient voiced understanding.  Patient left via her family member transporting her to the hospital. Final Clinical Impressions(s) / UC Diagnoses   Final diagnoses:  Palpitations  Nausea without vomiting  Acute intractable headache, unspecified headache type  Lower abdominal pain  Other acute back pain  Leg swelling  Elevated blood pressure reading     Discharge Instructions      Go to the emergency department as soon as you leave urgent care for further evaluation and management.    ED Prescriptions   None    PDMP not reviewed this encounter.   Gustavus Bryant, [place] 07/30/22 [phone]

## 2022-07-30 NOTE — Discharge Instructions (Signed)
Go to the emergency department as soon as you leave urgent care for further evaluation and management. 

## 2022-07-30 NOTE — ED Triage Notes (Signed)
Pt is present today with c/o lower back pain, bilateral leg swelling and HA. Pt states her sx started yesterday. Pt states that she is still taking her antibiotics for her UTI

## 2022-07-31 ENCOUNTER — Emergency Department (HOSPITAL_COMMUNITY): Payer: Medicaid Other

## 2022-07-31 LAB — TROPONIN I (HIGH SENSITIVITY): Troponin I (High Sensitivity): 5 ng/L (ref <18)

## 2022-07-31 MED ORDER — IOHEXOL 300 MG/ML  SOLN
100.0000 mL | Freq: Once | INTRAMUSCULAR | Status: AC | PRN
  Administered 2022-07-31: 100 mL via INTRAVENOUS

## 2022-07-31 MED ORDER — SODIUM CHLORIDE 0.9 % IV SOLN: 1.0000 g | Freq: Once | INTRAVENOUS | Status: DC

## 2022-07-31 MED ORDER — IBUPROFEN 400 MG PO TABS
600.0000 mg | ORAL_TABLET | Freq: Once | ORAL | Status: AC
  Administered 2022-07-31: 600 mg via ORAL
  Filled 2022-07-31: qty 1

## 2022-07-31 MED ORDER — SULFAMETHOXAZOLE-TRIMETHOPRIM 800-160 MG PO TABS
1.0000 | ORAL_TABLET | Freq: Once | ORAL | Status: AC
  Administered 2022-07-31: 1 via ORAL
  Filled 2022-07-31: qty 1

## 2022-07-31 MED ORDER — KETOROLAC TROMETHAMINE 15 MG/ML IJ SOLN: 30.0000 mg | Freq: Once | INTRAMUSCULAR | Status: DC

## 2022-07-31 MED ORDER — ACETAMINOPHEN 500 MG PO TABS
1000.0000 mg | ORAL_TABLET | Freq: Once | ORAL | Status: AC
  Administered 2022-07-31: 1000 mg via ORAL
  Filled 2022-07-31: qty 2

## 2022-07-31 MED ORDER — IBUPROFEN 800 MG PO TABS: 800.0000 mg | ORAL_TABLET | Freq: Three times a day (TID) | ORAL | 0 refills | Status: DC | PRN

## 2022-07-31 MED ORDER — ONDANSETRON HCL 4 MG PO TABS: 4.0000 mg | ORAL_TABLET | Freq: Three times a day (TID) | ORAL | 0 refills | Status: AC | PRN

## 2022-07-31 MED ORDER — SULFAMETHOXAZOLE-TRIMETHOPRIM 800-160 MG PO TABS: 1.0000 | ORAL_TABLET | Freq: Two times a day (BID) | ORAL | 0 refills | Status: AC

## 2022-07-31 MED ORDER — LACTATED RINGERS IV SOLN: INTRAVENOUS | Status: DC

## 2022-07-31 MED ORDER — ONDANSETRON 4 MG PO TBDP
8.0000 mg | ORAL_TABLET | Freq: Once | ORAL | Status: AC
  Administered 2022-07-31: 8 mg via ORAL
  Filled 2022-07-31: qty 2

## 2022-07-31 NOTE — ED Provider Notes (Signed)
[facility] EMERGENCY DEPARTMENT Provider Note   CSN: [id number] Arrival date & time: 07/30/22  1816     History  Chief Complaint  Patient presents with   Back Pain    Linda Carlson is a 41 y.o. female history of diabetes, hospitalization for UTI who presents with 7 days of dysuria and hematuria.  Last week she went to urgent care she was prescribed a course of Macrobid for treatment.  She was unable to take the full dose of her antibiotics due to upset stomach.  Symptoms progressively got worse.  She now complains of back pain.  She notes frank hematuria for the last 3 days.  She went back to urgent care where they advised she go to ED for hypotension and tachycardia in setting of UTI.  She endorses nausea, vomiting over the last 2 days.  She complains of severe headache that is responsive to Motrin.  Social history: Patient works as a Leisure centre manager.  She drinks 3 drinks a week, she does not use tobacco or drugs.  Back Pain      Home Medications Prior to Admission medications   Medication Sig Start Date End Date Taking? Authorizing Provider  ibuprofen (ADVIL) 800 MG tablet Take 1 tablet (800 mg total) by mouth every 8 (eight) hours as needed for mild pain or moderate pain. 07/31/22  Yes Marrianne Mood, MD  ondansetron (ZOFRAN) 4 MG tablet Take 1 tablet (4 mg total) by mouth every 8 (eight) hours as needed for up to 7 days for nausea or vomiting. 07/31/22 08/07/22 Yes Marrianne Mood, MD  sulfamethoxazole-trimethoprim (BACTRIM DS) 800-160 MG tablet Take 1 tablet by mouth 2 (two) times daily for 7 days. 07/31/22 08/07/22 Yes Marrianne Mood, MD  glimepiride (AMARYL) 1 MG tablet Take 1 tablet (1 mg total) by mouth daily with breakfast. Patient not taking: No sig reported 07/26/20   Eustaquio Boyden, MD  metFORMIN (GLUCOPHAGE) 500 MG tablet Take 500 mg by mouth 2 (two) times daily. Patient not taking: Reported on 10/28/2021    [provider]  metFORMIN  (GLUCOPHAGE) 500 MG tablet Take 1 tablet by mouth 2 (two) times daily.    [provider]  Multiple Vitamins-Minerals (MULTIVITAMIN PO) Take 1 tablet by mouth daily. Patient not taking: Reported on 10/28/2021    [provider]  nitrofurantoin, macrocrystal-monohydrate, (MACROBID) 100 MG capsule Take 1 capsule (100 mg total) by mouth 2 (two) times daily. 07/23/22   Tomi Bamberger, PA-C      Allergies    Patient has no known allergies.    Review of Systems   Review of Systems  Musculoskeletal:  Positive for back pain.  All other systems reviewed and are negative.   Physical Exam Updated Vital Signs BP 106/71 (BP Location: Right Arm)   Pulse 85   Temp 98 F (36.7 C) (Oral)   Resp 16   Ht 5\' 5"  (1.651 m)   Wt 74.8 kg   SpO2 100%   BMI 27.46 kg/m  Physical Exam Vitals and nursing note reviewed.  Constitutional:      General: She is not in acute distress.    Appearance: She is well-developed.  HENT:     Head: Normocephalic and atraumatic.  Eyes:     Conjunctiva/sclera: Conjunctivae normal.  Cardiovascular:     Rate and Rhythm: Normal rate and regular rhythm.  Pulmonary:     Effort: Pulmonary effort is normal. No respiratory distress.  Abdominal:     Palpations: Abdomen is soft.  Tenderness: There is abdominal tenderness (Suprapubic tenderness.).  Musculoskeletal:        General: No swelling.     Cervical back: Neck supple.  Skin:    General: Skin is warm and dry.     Capillary Refill: Capillary refill takes less than 2 seconds.  Neurological:     Mental Status: She is alert.  Psychiatric:        Mood and Affect: Mood normal.     ED Results / Procedures / Treatments   Labs (all labs ordered are listed, but only abnormal results are displayed) Labs Reviewed  COMPREHENSIVE METABOLIC PANEL - Abnormal; Notable for the following components:      Result Value   Glucose, Bld 217 (*)    BUN 5 (*)    All other components within normal limits   URINALYSIS, ROUTINE W REFLEX MICROSCOPIC - Abnormal; Notable for the following components:   APPearance HAZY (*)    Glucose, UA >=500 (*)    Hgb urine dipstick MODERATE (*)    Ketones, ur 80 (*)    Protein, ur 100 (*)    Leukocytes,Ua LARGE (*)    RBC / HPF >50 (*)    WBC, UA >50 (*)    Bacteria, UA RARE (*)    All other components within normal limits  I-STAT CHEM 8, ED - Abnormal; Notable for the following components:   BUN 3 (*)    Creatinine, Ser 0.30 (*)    Glucose, Bld 216 (*)    All other components within normal limits  CBC WITH DIFFERENTIAL/PLATELET  LIPASE, BLOOD  I-STAT BETA HCG BLOOD, ED ([facility], [facility], [facility] ONLY)  TROPONIN I (HIGH SENSITIVITY)  TROPONIN I (HIGH SENSITIVITY)    EKG None  Radiology CT ABDOMEN PELVIS W CONTRAST  Result Date: 07/31/2022 CLINICAL DATA:  Flank pain with kidney stone suspected EXAM: CT ABDOMEN AND PELVIS WITH CONTRAST TECHNIQUE: Multidetector CT imaging of the abdomen and pelvis was performed using the standard protocol following bolus administration of intravenous contrast. RADIATION DOSE REDUCTION: This exam was performed according to the departmental dose-optimization program which includes automated exposure control, adjustment of the mA and/or kV according to patient size and/or use of iterative reconstruction technique. CONTRAST:  OMNIPAQUE IOHEXOL 300 MG/ML  SOLN COMPARISON:  10/28/2021 FINDINGS: Lower chest:  No contributory findings. Hepatobiliary: Steatotic appearance of the liver.No evidence of biliary obstruction or stone. Pancreas: Unremarkable. Spleen: Unremarkable. Adrenals/Urinary Tract: Negative adrenals. No hydronephrosis or stone. Accentuated appearance of the bladder wall thickness laterally, similar to prior. No acute inflammation is seen. Tiny renal cortical cysts. Stomach/Bowel:  No obstruction. No appendicitis. Vascular/Lymphatic: No acute vascular abnormality. No mass or adenopathy. Reproductive:No pathologic findings. Other:  No ascites or pneumoperitoneum. Musculoskeletal: No acute abnormalities. IMPRESSION: No acute finding.  No hydronephrosis or evidence of renal infection. Electronically Signed   By: Tiburcio Pea M.D.   On: 07/31/2022 05:49   DG Chest 2 View  Result Date: 07/30/2022 CLINICAL DATA:  Palpitations EXAM: CHEST - 2 VIEW COMPARISON:  10/27/2021 FINDINGS: The heart size and mediastinal contours are within normal limits. Both lungs are clear. The visualized skeletal structures are unremarkable. IMPRESSION: No acute abnormality of the lungs. Electronically Signed   By: Jearld Lesch M.D.   On: 07/30/2022 20:12    Medications Ordered in ED Medications  acetaminophen (TYLENOL) tablet 1,000 mg (1,000 mg Oral Given 07/31/22 0453)  iohexol (OMNIPAQUE) 300 MG/ML solution 100 mL (100 mLs Intravenous Contrast Given 07/31/22 0530)  sulfamethoxazole-trimethoprim (BACTRIM DS)  800-160 MG per tablet 1 tablet (1 tablet Oral Given 07/31/22 1023)  ibuprofen (ADVIL) tablet 600 mg (600 mg Oral Given 07/31/22 1022)  ondansetron (ZOFRAN-ODT) disintegrating tablet 8 mg (8 mg Oral Given 07/31/22 1023)    ED Course/ Medical Decision Making/ A&P Clinical Course as of 07/31/22 1034  Thu Jul 31, 2022  1010 Patient presents with several days of UTI symptoms.  She received treatment in the outpatient setting but could not tolerate Macrobid.  Symptoms continue to worsen.  She went back to urgent care yesterday and was advised to go to the ED due to hypertension and tachycardia.  Urine culture from last week showed Enterobacter with intermediate resistance to Macrobid anyway.  Patient was hemodynamically stable.  CT was negative for hydronephrosis or signs of pyelonephritis.  Do not suspect sepsis or urinary obstruction due to renal calculi or other causes.  She declined IV antibiotics in favor of discharge home with Bactrim, Zofran, and ibuprofen. [MM]    Clinical Course User Index [MM] Marrianne Mood, MD                            Medical Decision Making Risk Prescription drug management.   This patient presents to the ED for concern of hypertension and tachycardia in the setting of UTI, this involves an extensive number of treatment options, and is a complaint that carries with it a high risk of complications and morbidity.  The differential diagnosis includes complicated UTI, pyelonephritis, sepsis, acute simple cystitis, urinary outflow obstruction due to renal calculus or other cause.     Co morbidities that complicate the patient evaluation  Previous hospitalization for urinary tract infection Diabetes Inability to tolerate Macrobid due to GI side effects   Social Determinants of Health:  Works as a Leisure centre manager.  No alcohol, tobacco, or substance use disorder on screening.   Additional history obtained:  Additional history and/or information obtained from chart review. External records from outside source obtained and reviewed including prior urine culture results.  I independently reviewed and interpreted the studies that showed urine culture positive for Enterobacter with intermediate resistance to Macrobid and susceptibility to Bactrim.   Lab Tests:  I Ordered (or co-signed), and personally interpreted labs.  The pertinent results include:   Urinalysis, notable for pyuria and hematuria CMP notable for hyperglycemia Lipase normal CBC within normal limits   Imaging Studies ordered:  I ordered (or co-signed) imaging studies including CT abdomen pelvis with contrast and chest x-ray I independently visualized and interpreted imaging which showed  CTAP with contrast: No signs of renal infection, hydronephrosis, renal calculi CXR: No acute chest pathology I agree with the radiologist interpretation   Medicines ordered and prescription drug management:  I ordered medication including Bactrim, acetaminophen, ibuprofen, ondansetron Reevaluation of the patient after these medicines showed that the  patient improved  Reevaluation:  After the interventions noted above, I reevaluated the patient and found that they have :improved.    Dispostion:  After consideration of the diagnostic results and the patients response to treatment, I feel that the patent would benefit from discharge home with a 7-day course of Bactrim to treat Enterobacter UTI, Zofran for nausea, ibuprofen for pain.          Final Clinical Impression(s) / ED Diagnoses Final diagnoses:  Acute cystitis with hematuria    Rx / DC Orders ED Discharge Orders          Ordered    sulfamethoxazole-trimethoprim (  BACTRIM DS) 800-160 MG tablet  2 times daily        07/31/22 0936    ondansetron (ZOFRAN) 4 MG tablet  Every 8 hours PRN        07/31/22 1005    ibuprofen (ADVIL) 800 MG tablet  Every 8 hours PRN        07/31/22 1005              Marrianne Mood, MD 07/31/22 1034    Jacalyn Lefevre, MD 07/31/22 1151

## 2022-08-13 ENCOUNTER — Ambulatory Visit
Admission: EM | Admit: 2022-08-13 | Discharge: 2022-08-13 | Disposition: A | Discharge status: Home / Self Care | Payer: Medicaid Other | Attending: Internal Medicine | Admitting: Internal Medicine

## 2022-08-13 ENCOUNTER — Encounter: Payer: Self-pay | Admitting: Emergency Medicine

## 2022-08-13 DIAGNOSIS — J069 Acute upper respiratory infection, unspecified: Secondary | ICD-10-CM | POA: Diagnosis present

## 2022-08-13 DIAGNOSIS — H109 Unspecified conjunctivitis: Secondary | ICD-10-CM | POA: Diagnosis present

## 2022-08-13 DIAGNOSIS — Z20822 Contact with and (suspected) exposure to covid-19: Secondary | ICD-10-CM | POA: Diagnosis not present

## 2022-08-13 LAB — SARS CORONAVIRUS 2 BY RT PCR: SARS Coronavirus 2 by RT PCR: NEGATIVE

## 2022-08-13 MED ORDER — FLUTICASONE PROPIONATE 50 MCG/ACT NA SUSP: 1.0000 | Freq: Every day | NASAL | 0 refills | Status: DC

## 2022-08-13 MED ORDER — OFLOXACIN 0.3 % OP SOLN: OPHTHALMIC | 0 refills | Status: AC

## 2022-08-13 MED ORDER — CETIRIZINE HCL 10 MG PO TABS: 10.0000 mg | ORAL_TABLET | Freq: Every day | ORAL | 0 refills | Status: DC

## 2022-08-13 NOTE — ED Provider Notes (Signed)
[facility]    CSN: [id number] Arrival date & time: 08/13/22  0932      History   Chief Complaint Chief Complaint  Patient presents with   Facial Pain   Headache    HPI Linda Carlson is a 41 y.o. female.   Patient presents with left-sided facial pain, runny nose, left ear pain, left eye irritation that started about 3 days prior.  She denies any known sick contacts or fever.  Denies cough, chest pain, shortness of breath, sore throat, nausea, vomiting, diarrhea, abdominal pain.  She does not report taking any medications to alleviate symptoms.  Patient reports left pain, watery drainage, irritation to the point where she cannot open her eye.  Denies trauma or foreign body to the eye.  She does wear contacts but reports that she has not been wearing them since symptoms started.   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      Home Medications    Prior to Admission medications   Medication Sig Start Date End Date Taking? Authorizing Provider  cetirizine (ZYRTEC) 10 MG tablet Take 1 tablet (10 mg total) by mouth daily. 08/13/22  Yes Corie Vavra, Rolly Salter E, FNP  fluticasone  (FLONASE) 50 MCG/ACT nasal spray Place 1 spray into both nostrils daily for 3 days. 08/13/22 08/16/22 Yes Nerea Bordenave, Acie Fredrickson, FNP  ofloxacin (OCUFLOX) 0.3 % ophthalmic solution Place 1 drop into the left eye every 4 (four) hours for 2 days, THEN 1 drop 4 (four) times daily for 5 days. 08/13/22 08/20/22 Yes Allisha Harter, Acie Fredrickson, FNP  glimepiride (AMARYL) 1 MG tablet Take 1 tablet (1 mg total) by mouth daily with breakfast. Patient not taking: No sig reported 07/26/20   Eustaquio Boyden, MD  ibuprofen (ADVIL) 800 MG tablet Take 1 tablet (800 mg total) by mouth every 8 (eight) hours as needed for mild pain or moderate pain. 07/31/22   Marrianne Mood, MD  metFORMIN (GLUCOPHAGE) 500 MG tablet Take 500 mg by mouth 2 (two) times daily. Patient not taking: Reported on 10/28/2021    [provider]  metFORMIN (GLUCOPHAGE) 500 MG tablet Take 1 tablet by mouth 2 (two) times daily.    [provider]  Multiple Vitamins-Minerals (MULTIVITAMIN PO) Take 1 tablet by mouth daily. Patient not taking: Reported on 10/28/2021    [provider]  nitrofurantoin, macrocrystal-monohydrate, (MACROBID) 100 MG capsule Take 1 capsule (100 mg total) by mouth 2 (two) times daily. 07/23/22   Tomi Bamberger, PA-C    Family History Family History  Problem Relation Age of Onset   Hypertension Mother     Social  History Social History   Tobacco Use   Smoking status: Never   Smokeless tobacco: Never  Substance Use Topics   Alcohol use: No   Drug use: No     Allergies   Patient has no known allergies.   Review of Systems Review of Systems Per HPI  Physical Exam Triage Vital Signs ED Triage Vitals  Enc Vitals Group     BP 08/13/22 1058 131/85     Pulse Rate 08/13/22 1058 (!) 102     Resp 08/13/22 1058 18     Temp 08/13/22 1058 98 F (36.7 C)     Temp src --      SpO2 08/13/22 1058 98 %     Weight --      Height --      Head Circumference --      Peak Flow --      Pain Score 08/13/22  1057 8     Pain Loc --      Pain Edu? --      Excl. in GC? --    No data found.  Updated Vital Signs BP 131/85   Pulse (!) 102   Temp 98 F (36.7 C)   Resp 18   SpO2 98%   Visual Acuity Right Eye Distance:   Left Eye Distance:   Bilateral Distance:    Right Eye Near:   Left Eye Near:    Bilateral Near:     Physical Exam Constitutional:      General: She is not in acute distress.    Appearance: Normal appearance. She is not toxic-appearing or diaphoretic.  HENT:     Head: Normocephalic and atraumatic.     Right Ear: Tympanic membrane and ear canal normal.     Left Ear: Tympanic membrane and ear canal normal.     Nose: Congestion present.     Right Sinus: No maxillary sinus tenderness or frontal sinus tenderness.     Left Sinus: No maxillary sinus tenderness or frontal sinus tenderness.     Mouth/Throat:     Mouth: Mucous membranes are moist.     Pharynx: No posterior oropharyngeal erythema.  Eyes:     General: Lids are normal. Lids are everted, no foreign bodies appreciated. Vision grossly intact. Gaze aligned appropriately.     Extraocular Movements: Extraocular movements intact.     Conjunctiva/sclera:     Right eye: Right conjunctiva is injected. Chemosis present. No exudate or hemorrhage.    Pupils: Pupils are equal, round, and reactive to light.     Left eye: Fluorescein uptake present.      Comments: Diffuse scleral redness.  Minimal fluorescein reuptake to circled area on diagram of left eye that is mildly concerning for corneal abrasion.  Clear drainage from eye noted.  Cardiovascular:     Rate and Rhythm: Normal rate and regular rhythm.     Pulses: Normal pulses.     Heart sounds: Normal heart sounds.  Pulmonary:     Effort: Pulmonary effort is normal. No respiratory distress.     Breath sounds: Normal breath sounds. No stridor. No wheezing, rhonchi or rales.  Abdominal:     General: Abdomen is flat. Bowel sounds are normal.     Palpations: Abdomen is  soft.  Musculoskeletal:        General: Normal range of motion.     Cervical back: Normal range of motion.  Skin:    General: Skin is warm and dry.  Neurological:  General: No focal deficit present.     Mental Status: She is alert and oriented to person, place, and time. Mental status is at baseline.  Psychiatric:        Mood and Affect: Mood normal.        Behavior: Behavior normal.      UC Treatments / Results  Labs (all labs ordered are listed, but only abnormal results are displayed) Labs Reviewed  SARS CORONAVIRUS 2 BY RT PCR    EKG   Radiology No results found.  Procedures Procedures (including critical care time)  Medications Ordered in UC Medications - No data to display  Initial Impression / Assessment and Plan / UC Course  I have reviewed the triage vital signs and the nursing notes.  Pertinent labs & imaging results that were available during my care of the patient were reviewed by me and considered in my medical decision making (see chart for details).     Patient presents with symptoms likely from a viral upper respiratory infection. Differential includes bacterial pneumonia, sinusitis, allergic rhinitis, covid 19, flu. Do not suspect underlying cardiopulmonary process. Symptoms seem unlikely related to ACS, CHF or COPD exacerbations, pneumonia, pneumothorax. Patient is nontoxic appearing and not in need of emergent medical intervention.  COVID test pending.  Recommended symptom control with over the counter medications.  Patient sent prescriptions.  Differential diagnoses for left eye are viral conjunctivitis versus bacterial conjunctivitis versus corneal abrasion versus contact lens keratitis.  There is minimal fluorescein uptake noted that is questionable for corneal abrasions but not definitive.  Dr. Nada Libman with on-call ophthalmology was called.  He advised ofloxacin eyedrops and following up tomorrow at [facility] for further evaluation  and management.  Advised patient to keep contact lenses out as well.  Provided patient with address for [facility] and advised to follow-up tomorrow at 8 AM.  Unable to perform visual acuity test given patient was not able to cooperate.  Return if symptoms fail to improve in 1-2 weeks or you develop shortness of breath, chest pain, severe headache. Patient states understanding and is agreeable.  Discharged with PCP followup.  Final Clinical Impressions(s) / UC Diagnoses   Final diagnoses:  Viral upper respiratory infection  Bacterial conjunctivitis of left eye     Discharge Instructions      Go to [facility] tomorrow at 8 AM for further evaluation and management.  You have been prescribed an antibiotic eyedrop that you will apply to your eyes.  It also appears that you have a viral upper respiratory infection that should run its course and self resolve with symptomatic treatment.  You have been sent two medications to alleviate symptoms.  Please follow-up if symptoms persist or worsen.  COVID test is pending.  We will call if it is positive.    ED Prescriptions     Medication Sig Dispense Auth. Provider   ofloxacin (OCUFLOX) 0.3 % ophthalmic solution Place 1 drop into the left eye every 4 (four) hours for 2 days, THEN 1 drop 4 (four) times daily for 5 days. 5 mL Mikaila Grunert, Rolly Salter E, FNP   fluticasone (FLONASE) 50 MCG/ACT nasal spray Place 1 spray into both nostrils daily for 3 days. 16 g Ervin Knack E, [place]   cetirizine (ZYRTEC) 10 MG tablet Take 1 tablet (10 mg total) by mouth daily. 30 tablet [place], Acie Fredrickson, [place]      PDMP not reviewed this encounter.   Gustavus Bryant, [place] 08/13/22 1218

## 2022-08-13 NOTE — Discharge Instructions (Addendum)
Go to [facility] tomorrow at 8 AM for further evaluation and management.  You have been prescribed an antibiotic eyedrop that you will apply to your eyes.  It also appears that you have a viral upper respiratory infection that should run its course and self resolve with symptomatic treatment.  You have been sent two medications to alleviate symptoms.  Please follow-up if symptoms persist or worsen.  COVID test is pending.  We will call if it is positive.

## 2022-08-13 NOTE — ED Triage Notes (Signed)
Pt is present today with left side facial pain (sinus), HA, and left ear pain. Pt sx started Monday

## 2022-08-13 NOTE — ED Notes (Signed)
Unable to complete visual acuity due to patient stating that it was difficult to open her eyes

## 2022-12-15 ENCOUNTER — Ambulatory Visit
Admission: EM | Admit: 2022-12-15 | Discharge: 2022-12-15 | Disposition: A | Discharge status: Home / Self Care | Payer: Medicaid Other | Attending: Nurse Practitioner | Admitting: Nurse Practitioner

## 2022-12-15 DIAGNOSIS — J0100 Acute maxillary sinusitis, unspecified: Secondary | ICD-10-CM | POA: Diagnosis not present

## 2022-12-15 MED ORDER — FLUTICASONE PROPIONATE 50 MCG/ACT NA SUSP: 1.0000 | Freq: Every day | NASAL | 0 refills | Status: DC

## 2022-12-15 MED ORDER — AMOXICILLIN-POT CLAVULANATE 875-125 MG PO TABS: 1.0000 | ORAL_TABLET | Freq: Two times a day (BID) | ORAL | 0 refills | Status: AC

## 2022-12-15 MED ORDER — PROMETHAZINE-DM 6.25-15 MG/5ML PO SYRP: 5.0000 mL | ORAL_SOLUTION | Freq: Four times a day (QID) | ORAL | 0 refills | Status: DC | PRN

## 2022-12-15 NOTE — ED Provider Notes (Signed)
[facility]    CSN: [id number] Arrival date & time: 12/15/22  0802      History   Chief Complaint Chief Complaint  Patient presents with   Sore Throat    HPI Linda Carlson is a 41 y.o. female presents for evaluation of URI symptoms for 7 days. Patient reports associated symptoms of cough, congestion, sinus pressure/pain with purulent nasal discharge, sore throat, and fevers. Denies N/V/D, body aches, shortness of breath. Patient does not have a hx of asthma or smoking. No known sick contacts and no recent travel. Pt is at vaccinated for COVID. Pt is not vaccinated for flu this season. Pt has taken the next OTC for symptoms. Pt has no other concerns at this time.    Sore Throat    Past Medical History:  Diagnosis Date   ANEMIA, HX OF 03/14/2011   Qualifier: Diagnosis of  By: Sharen Hones  MD, Javier     COVID-19 virus infection 03/2020   Diabetes mellitus without complication (HCC)    DIABETES MELLITUS, GESTATIONAL, HX OF 03/14/2011   Qualifier: Diagnosis of  By: Sharen Hones  MD, Wynona Canes     Fatty liver    Seasonal allergic rhinitis     Patient Active Problem List   Diagnosis Date Noted   Sepsis (HCC) 10/29/2021   UTI (urinary tract infection) 10/28/2021   Diabetes mellitus without complication (HCC)    Hypokalemia    Adjustment disorder 01/31/2013   Diabetes mellitus type 2, uncontrolled, 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      Home Medications    Prior to Admission medications   Medication Sig Start Date End Date Taking? Authorizing Provider  amoxicillin-clavulanate (AUGMENTIN) 875-125 MG tablet Take 1 tablet by mouth every 12 (twelve) hours for 10 days. 12/15/22 12/25/22 Yes Radford Pax, NP  fluticasone (FLONASE) 50 MCG/ACT nasal spray Place 1 spray into both nostrils daily.  12/15/22  Yes Radford Pax, NP  promethazine-dextromethorphan (PROMETHAZINE-DM) 6.25-15 MG/5ML syrup Take 5 mLs by mouth 4 (four) times daily as needed for cough. 12/15/22  Yes Radford Pax, NP  cetirizine (ZYRTEC) 10 MG tablet Take 1 tablet (10 mg total) by mouth daily. 08/13/22   Gustavus Bryant, FNP  glimepiride (AMARYL) 1 MG tablet Take 1 tablet (1 mg total) by mouth daily with breakfast. Patient not taking: No sig reported 07/26/20   Eustaquio Boyden, MD  ibuprofen (ADVIL) 800 MG tablet Take 1 tablet (800 mg total) by mouth every 8 (eight) hours as needed for mild pain or moderate pain. 07/31/22   Marrianne Mood, MD  metFORMIN (GLUCOPHAGE) 500 MG tablet Take 500 mg by mouth 2 (two) times daily. Patient not taking: Reported on 10/28/2021    [provider]  metFORMIN (GLUCOPHAGE) 500 MG tablet Take 1 tablet by mouth 2 (two) times daily.    [provider]  Multiple Vitamins-Minerals (MULTIVITAMIN PO) Take 1 tablet by mouth daily. Patient not taking: Reported on 10/28/2021    [provider]    Family History Family History  Problem Relation Age of Onset   Hypertension Mother     Social History Social History   Tobacco Use   Smoking status: Never   Smokeless tobacco: Never  Substance Use Topics   Alcohol use: No   Drug use: No  Allergies   Patient has no known allergies.   Review of Systems Review of Systems  Constitutional:  Positive for fever.  HENT:  Positive for congestion, sinus pressure, sinus pain and sore throat.   Respiratory:  Positive for cough.      Physical Exam Triage Vital Signs ED Triage Vitals [12/15/22 0817]  Enc Vitals Group     BP (!) 163/98     Pulse Rate 96     Resp 16     Temp 98.3 F (36.8 C)     Temp Source Oral     SpO2 97 %     Weight      Height      Head Circumference      Peak Flow      Pain Score 7     Pain Loc      Pain Edu?      Excl. in GC?    No data found.  Updated Vital Signs BP  (!) 163/98 (BP Location: Left Arm)   Pulse 96   Temp 98.3 F (36.8 C) (Oral)   Resp 16   SpO2 97%   Visual Acuity Right Eye Distance:   Left Eye Distance:   Bilateral Distance:    Right Eye Near:   Left Eye Near:    Bilateral Near:     Physical Exam Vitals and nursing note reviewed.  Constitutional:      General: She is not in acute distress.    Appearance: She is well-developed. She is not ill-appearing.  HENT:     Head: Normocephalic and atraumatic.     Right Ear: Tympanic membrane and ear canal normal.     Left Ear: Tympanic membrane and ear canal normal.     Nose: Congestion present.     Right Turbinates: Swollen and pale.     Left Turbinates: Swollen and pale.     Right Sinus: Maxillary sinus tenderness present.     Left Sinus: Maxillary sinus tenderness present.     Mouth/Throat:     Mouth: Mucous membranes are moist.     Pharynx: Oropharynx is clear. Uvula midline. Posterior oropharyngeal erythema present.     Tonsils: No tonsillar exudate or tonsillar abscesses.  Eyes:     Conjunctiva/sclera: Conjunctivae normal.     Pupils: Pupils are equal, round, and reactive to light.  Cardiovascular:     Rate and Rhythm: Normal rate and regular rhythm.     Heart sounds: Normal heart sounds.  Pulmonary:     Effort: Pulmonary effort is normal.     Breath sounds: Normal breath sounds.  Musculoskeletal:     Cervical back: Normal range of motion and neck supple.  Lymphadenopathy:     Cervical: No cervical adenopathy.  Skin:    General: Skin is warm and dry.  Neurological:     General: No focal deficit present.     Mental Status: She is alert and oriented to person, place, and time.  Psychiatric:        Mood and Affect: Mood normal.        Behavior: Behavior normal.      UC Treatments / Results  Labs (all labs ordered are listed, but only abnormal results are displayed) Labs Reviewed - No data to display     Radiology No results  found.  Procedures Procedures (including critical care time)  Medications Ordered in UC Medications - No data to display  Initial Impression / Assessment and Plan / UC Course  I have reviewed the triage vital signs and the nursing notes.  Pertinent labs & imaging results that were available during my care of the patient were reviewed by me and considered in my medical decision making (see chart for details).     Augmentin twice daily for 10 days Flonase daily Promethazine as needed Rest and fluids PCP follow-up 2 to 3 days for recheck ER precautions reviewed and patient verbalized understanding Final Clinical Impressions(s) / UC Diagnoses   Final diagnoses:  Acute maxillary sinusitis, recurrence not specified     Discharge Instructions      Augmentin twice daily for 10 days Flonase daily Cough syrup as needed Rest and fluids PCP follow-up 2 to 3 days for recheck ER for any worsening symptoms   ED Prescriptions     Medication Sig Dispense Auth. Provider   amoxicillin-clavulanate (AUGMENTIN) 875-125 MG tablet Take 1 tablet by mouth every 12 (twelve) hours for 10 days. 20 tablet Radford Pax, NP   fluticasone (FLONASE) 50 MCG/ACT nasal spray Place 1 spray into both nostrils daily. 15.8 mL Radford Pax, NP   promethazine-dextromethorphan (PROMETHAZINE-DM) 6.25-15 MG/5ML syrup Take 5 mLs by mouth 4 (four) times daily as needed for cough. 118 mL Radford Pax, NP      PDMP not reviewed this encounter.   Radford Pax, NP 12/15/22 [phone]

## 2022-12-15 NOTE — Discharge Instructions (Addendum)
Augmentin twice daily for 10 days Flonase daily Cough syrup as needed Rest and fluids PCP follow-up 2 to 3 days for recheck ER for any worsening symptoms

## 2022-12-15 NOTE — ED Triage Notes (Signed)
Pt c/o facial pressure, congestion, sore throat onset ~ 1 week ago. Associated cough, headache, ear aches/pressures.

## 2023-01-01 ENCOUNTER — Emergency Department (HOSPITAL_COMMUNITY)
Admission: EM | Admit: 2023-01-01 | Discharge: 2023-01-02 | Disposition: A | Discharge status: Home / Self Care | Payer: Medicaid Other | Attending: Emergency Medicine | Admitting: Emergency Medicine

## 2023-01-01 ENCOUNTER — Encounter (HOSPITAL_COMMUNITY): Payer: Self-pay

## 2023-01-01 ENCOUNTER — Emergency Department (HOSPITAL_COMMUNITY): Payer: Medicaid Other

## 2023-01-01 ENCOUNTER — Ambulatory Visit
Admission: EM | Admit: 2023-01-01 | Discharge: 2023-01-01 | Disposition: A | Discharge status: Home / Self Care | Payer: Medicaid Other | Attending: Internal Medicine | Admitting: Internal Medicine

## 2023-01-01 ENCOUNTER — Other Ambulatory Visit: Payer: Self-pay

## 2023-01-01 DIAGNOSIS — M545 Low back pain, unspecified: Secondary | ICD-10-CM | POA: Diagnosis not present

## 2023-01-01 DIAGNOSIS — Z3202 Encounter for pregnancy test, result negative: Secondary | ICD-10-CM | POA: Diagnosis not present

## 2023-01-01 DIAGNOSIS — N939 Abnormal uterine and vaginal bleeding, unspecified: Secondary | ICD-10-CM | POA: Diagnosis not present

## 2023-01-01 DIAGNOSIS — R102 Pelvic and perineal pain: Secondary | ICD-10-CM | POA: Insufficient documentation

## 2023-01-01 DIAGNOSIS — Z7984 Long term (current) use of oral hypoglycemic drugs: Secondary | ICD-10-CM | POA: Diagnosis not present

## 2023-01-01 DIAGNOSIS — E1165 Type 2 diabetes mellitus with hyperglycemia: Secondary | ICD-10-CM | POA: Insufficient documentation

## 2023-01-01 DIAGNOSIS — R000 Tachycardia, unspecified: Secondary | ICD-10-CM | POA: Insufficient documentation

## 2023-01-01 DIAGNOSIS — R739 Hyperglycemia, unspecified: Secondary | ICD-10-CM

## 2023-01-01 DIAGNOSIS — R030 Elevated blood-pressure reading, without diagnosis of hypertension: Secondary | ICD-10-CM | POA: Diagnosis not present

## 2023-01-01 DIAGNOSIS — R1030 Lower abdominal pain, unspecified: Secondary | ICD-10-CM

## 2023-01-01 DIAGNOSIS — R110 Nausea: Secondary | ICD-10-CM | POA: Insufficient documentation

## 2023-01-01 LAB — CBC WITH DIFFERENTIAL/PLATELET
Abs Immature Granulocytes: 0.05 10*3/uL (ref 0.00–0.07)
Basophils Absolute: 0.1 10*3/uL (ref 0.0–0.1)
Basophils Relative: 1 %
Eosinophils Absolute: 0.2 10*3/uL (ref 0.0–0.5)
Eosinophils Relative: 3 %
HCT: 40.2 % (ref 36.0–46.0)
Hemoglobin: 13.6 g/dL (ref 12.0–15.0)
Immature Granulocytes: 1 %
Lymphocytes Relative: 27 %
Lymphs Abs: 2 10*3/uL (ref 0.7–4.0)
MCH: 30 pg (ref 26.0–34.0)
MCHC: 33.8 g/dL (ref 30.0–36.0)
MCV: 88.7 fL (ref 80.0–100.0)
Monocytes Absolute: 0.5 10*3/uL (ref 0.1–1.0)
Monocytes Relative: 7 %
Neutro Abs: 4.5 10*3/uL (ref 1.7–7.7)
Neutrophils Relative %: 61 %
Platelets: 352 10*3/uL (ref 150–400)
RBC: 4.53 MIL/uL (ref 3.87–5.11)
RDW: 11.9 % (ref 11.5–15.5)
WBC: 7.3 10*3/uL (ref 4.0–10.5)
nRBC: 0 % (ref 0.0–0.2)

## 2023-01-01 LAB — POCT URINE PREGNANCY: Preg Test, Ur: NEGATIVE

## 2023-01-01 LAB — COMPREHENSIVE METABOLIC PANEL
ALT: 27 U/L (ref 0–44)
AST: 16 U/L (ref 15–41)
Albumin: 4.4 g/dL (ref 3.5–5.0)
Alkaline Phosphatase: 33 U/L — ABNORMAL LOW (ref 38–126)
Anion gap: 10 (ref 5–15)
BUN: 10 mg/dL (ref 6–20)
CO2: 24 mmol/L (ref 22–32)
Calcium: 9.2 mg/dL (ref 8.9–10.3)
Chloride: 101 mmol/L (ref 98–111)
Creatinine, Ser: 0.48 mg/dL (ref 0.44–1.00)
GFR, Estimated: >60 mL/min (ref >60)
Glucose, Bld: 345 mg/dL — ABNORMAL HIGH (ref 70–99)
Potassium: 3.6 mmol/L (ref 3.5–5.1)
Sodium: 135 mmol/L (ref 135–145)
Total Bilirubin: 0.6 mg/dL (ref 0.3–1.2)
Total Protein: 7.8 g/dL (ref 6.5–8.1)

## 2023-01-01 LAB — URINALYSIS, ROUTINE W REFLEX MICROSCOPIC
Bilirubin Urine: NEGATIVE
Glucose, UA: >=500 mg/dL — AB
Ketones, ur: 5 mg/dL — AB
Leukocytes,Ua: NEGATIVE
Nitrite: NEGATIVE
Protein, ur: NEGATIVE mg/dL
Specific Gravity, Urine: 1.035 — ABNORMAL HIGH (ref 1.005–1.030)
pH: 6 (ref 5.0–8.0)

## 2023-01-01 LAB — LIPASE, BLOOD: Lipase: 38 U/L (ref 11–51)

## 2023-01-01 LAB — PREGNANCY, URINE: Preg Test, Ur: NEGATIVE

## 2023-01-01 MED ORDER — ONDANSETRON HCL 4 MG/2ML IJ SOLN
4.0000 mg | Freq: Once | INTRAMUSCULAR | Status: AC
  Administered 2023-01-02: 4 mg via INTRAVENOUS
  Filled 2023-01-01: qty 2

## 2023-01-01 MED ORDER — MORPHINE SULFATE (PF) 4 MG/ML IV SOLN
4.0000 mg | Freq: Once | INTRAVENOUS | Status: AC
  Administered 2023-01-02: 4 mg via INTRAVENOUS
  Filled 2023-01-01: qty 1

## 2023-01-01 MED ORDER — SODIUM CHLORIDE 0.9 % IV BOLUS
1000.0000 mL | Freq: Once | INTRAVENOUS | Status: AC
  Administered 2023-01-02: 1000 mL via INTRAVENOUS

## 2023-01-01 NOTE — ED Triage Notes (Signed)
Pt presents with abnormal vaginal bleeding X 3 weeks with some lower back pain, cramping and some slight nausea.

## 2023-01-01 NOTE — ED Triage Notes (Signed)
Patient reports that she has been having a period x 2 weeks, abdominal cramping, and low back pain. Patient states she also has had clots and states there is a different odor.

## 2023-01-01 NOTE — Discharge Instructions (Signed)
Pregnancy test was negative.  Please go to the emergency department as soon as you leave urgent care for further evaluation and management.

## 2023-01-01 NOTE — ED Provider Triage Note (Signed)
Emergency Medicine Provider Triage Evaluation Note  Linda Carlson , a 42 y.o. female  was evaluated in triage.  Pt complains of vaginal bleeding since 12/20. Hx BTL with regular monthly cycles. Bleeding began at time of normal cycle onset but has continued. Slightly lighter than normal period but with occasional large clots. Associated with lower abdominal cramping, lower back pain, and occasional lightheadedness. No fever, chills, nausea, or vomiting. Reports has one sexual partner and no concern for STDs but has not been tested in about 1 year. Remote history of anemia when pregnant. Negative preg test at urgent care prior to arrival. Last pap smear normal 2  years ago, no history of abnormals though family history of unknown pelvic (possibly ovarian) cancer in mother. Denies dysuria, hematuria, frequency, or urinary retention.   Review of Systems  Positive: See HPI Negative: See HPI  Physical Exam  BP (!) 169/98 (BP Location: Left Arm)   Pulse 98   Temp 98.3 F (36.8 C) (Oral)   Resp 16   Ht 5\' 4"  (1.626 m)   Wt 74.8 kg   LMP 12/17/2022   SpO2 99%   BMI 28.32 kg/m  Gen:   Awake, no distress   Resp:  Normal effort  MSK:   Moves extremities without difficulty  Other:  Generalized abdominal tenderness--mild, bilateral lower abdominal tenderness--moderate, no rebound or guarding  Medical Decision Making  Medically screening exam initiated at 4:19 PM.  Appropriate orders placed.  Linda Carlson was informed that the remainder of the evaluation will be completed by another provider, this initial triage assessment does not replace that evaluation, and the importance of remaining in the ED until their evaluation is complete.     Suzzette Righter, PA-C 01/01/23 1622

## 2023-01-01 NOTE — ED Provider Notes (Signed)
[facility] DEPT Provider Note   CSN: [id number] Arrival date & time: 01/01/23  1449     History  Chief Complaint  Patient presents with   Vaginal Bleeding    Linda Carlson is a 42 y.o. female with a hx of diabetes mellitus, anemia, and prior tubal ligation who presents to the ED with complaints of pelvic pain for the past 2 to 3 days and vaginal bleeding since 12/17/2022.  Reports onset of menses 12/17/22, had a typical period for 1 week, but then bleeding eased up and persisted. Has been having continued spotting since then which is atypical for her.  2 to 3 days ago she developed diffuse pelvic pain that radiates to her back, mild aching pain at baseline however does have intermittent severe constant pain that feels like a cramping sensation that lasted few minutes at a time.  No alleviating or aggravating factors.  With severe pain she does get nauseated.  She denies vomiting.  She denies syncope, dysuria, or known exposure to STI.  HPI     Home Medications Prior to Admission medications   Medication Sig Start Date End Date Taking? Authorizing Provider  cetirizine (ZYRTEC) 10 MG tablet Take 1 tablet (10 mg total) by mouth daily. 08/13/22   Teodora Medici, FNP  fluticasone (FLONASE) 50 MCG/ACT nasal spray Place 1 spray into both nostrils daily. 12/15/22   Melynda Ripple, NP  glimepiride (AMARYL) 1 MG tablet Take 1 tablet (1 mg total) by mouth daily with breakfast. Patient not taking: No sig reported 07/26/20   Ria Bush, MD  ibuprofen (ADVIL) 800 MG tablet Take 1 tablet (800 mg total) by mouth every 8 (eight) hours as needed for mild pain or moderate pain. 07/31/22   Nani Gasser, MD  metFORMIN (GLUCOPHAGE) 500 MG tablet Take 500 mg by mouth 2 (two) times daily. Patient not taking: Reported on 10/28/2021    [provider]  metFORMIN (GLUCOPHAGE) 500 MG tablet Take 1 tablet by mouth 2 (two) times daily.    [provider]   Multiple Vitamins-Minerals (MULTIVITAMIN PO) Take 1 tablet by mouth daily. Patient not taking: Reported on 10/28/2021    [provider]  promethazine-dextromethorphan (PROMETHAZINE-DM) 6.25-15 MG/5ML syrup Take 5 mLs by mouth 4 (four) times daily as needed for cough. 12/15/22   Melynda Ripple, NP      Allergies    Patient has no known allergies.    Review of Systems   Review of Systems  Constitutional:  Negative for chills and fever.  Respiratory:  Negative for shortness of breath.   Cardiovascular:  Negative for chest pain.  Gastrointestinal:  Positive for nausea. Negative for diarrhea and vomiting.  Genitourinary:  Positive for pelvic pain and vaginal bleeding. Negative for dysuria and vaginal discharge.  Neurological:  Negative for syncope.  All other systems reviewed and are negative.   Physical Exam Updated Vital Signs BP (!) 154/85 (BP Location: Left Arm)   Pulse (!) 101   Temp 98.2 F (36.8 C) (Oral)   Resp 16   Ht 5\' 4"  (1.626 m)   Wt 74.8 kg   LMP 12/17/2022   SpO2 100%   BMI 28.32 kg/m  Physical Exam Vitals and nursing note reviewed.  Constitutional:      General: She is not in acute distress.    Appearance: She is well-developed. She is not toxic-appearing.  HENT:     Head: Normocephalic and atraumatic.  Eyes:     General:  Right eye: No discharge.        Left eye: No discharge.     Conjunctiva/sclera: Conjunctivae normal.  Cardiovascular:     Rate and Rhythm: Regular rhythm. Tachycardia present.  Pulmonary:     Effort: No respiratory distress.     Breath sounds: Normal breath sounds. No wheezing or rales.  Abdominal:     General: There is no distension.     Palpations: Abdomen is soft.     Tenderness: There is abdominal tenderness in the suprapubic area. There is no guarding or rebound.  Genitourinary:    Comments: Chaperone present during exam.  Vaginal vault with blood present, no significant active bleeding or clots noted.  Patient  with right adnexal tenderness.  No specific cervical motion tenderness. Musculoskeletal:     Cervical back: Neck supple.  Skin:    General: Skin is warm and dry.  Neurological:     Mental Status: She is alert.     Comments: Clear speech.   Psychiatric:        Behavior: Behavior normal.     ED Results / Procedures / Treatments   Labs (all labs ordered are listed, but only abnormal results are displayed) Labs Reviewed  COMPREHENSIVE METABOLIC PANEL - Abnormal; Notable for the following components:      Result Value   Glucose, Bld 345 (*)    Alkaline Phosphatase 33 (*)    All other components within normal limits  URINALYSIS, ROUTINE W REFLEX MICROSCOPIC - Abnormal; Notable for the following components:   Color, Urine STRAW (*)    APPearance HAZY (*)    Specific Gravity, Urine 1.035 (*)    Glucose, UA >=500 (*)    Hgb urine dipstick LARGE (*)    Ketones, ur 5 (*)    Bacteria, UA RARE (*)    All other components within normal limits  CBC WITH DIFFERENTIAL/PLATELET  LIPASE, BLOOD  PREGNANCY, URINE  RPR  GC/CHLAMYDIA PROBE AMP ([place]) NOT AT [facility]    EKG None  Radiology US Pelvis Complete  Result Date: 01/01/2023 CLINICAL DATA:  Vaginal bleeding, pain and cramping for 2 weeks EXAM: TRANSABDOMINAL AND TRANSVAGINAL ULTRASOUND OF PELVIS TECHNIQUE: Both transabdominal and transvaginal ultrasound examinations of the pelvis were performed. Transabdominal technique was performed for global imaging of the pelvis including uterus, ovaries, adnexal regions, and pelvic cul-de-sac. It was necessary to proceed with endovaginal exam following the transabdominal exam to visualize the left and right ovaries. COMPARISON:  None Available. FINDINGS: Uterus Measurements: 9.4 x 4.5 x 5.5 cm = volume: 122 mL. No fibroids or other mass visualized. Endometrium Thickness: 0.6 cm.  No focal abnormality visualized. Right ovary Measurements: 4.1 x 2.8 x 3.7 cm = volume: 22 mL. The right ovary is almost  completely occupied by a simple appearing cyst measuring 3.4 x 2.6 x 2.2 cm. Arterial and venous Doppler flow is present to the right ovary. Left ovary Measurements: 2.2 x 1.1 x 1.5 cm = volume: 2 mL. Normal appearance/no adnexal mass. Other findings No abnormal free fluid. IMPRESSION: 1. The right ovary is almost completely occupied by a simple appearing cyst measuring 3.4 x 2.6 x 2.2 cm. This is almost certainly benign in a reproductive age female. Please note that intermittent or incomplete ovarian torsion can not be strictly excluded in any enlarged ovary No followup imaging recommended. Note: This recommendation does not apply to premenarchal patients or to those with increased risk (genetic, family history, elevated tumor markers or other high-risk factors) of ovarian  cancer. Reference: Radiology 2019 Nov; 293(2):359-371. 2. Otherwise unremarkable pelvic ultrasound. Electronically Signed   By: Delanna Ahmadi M.D.   On: 01/01/2023 17:54   US PELVIS TRANSVAGINAL NON-OB (TV ONLY)  Result Date: 01/01/2023 CLINICAL DATA:  Vaginal bleeding, pain and cramping for 2 weeks EXAM: TRANSABDOMINAL AND TRANSVAGINAL ULTRASOUND OF PELVIS TECHNIQUE: Both transabdominal and transvaginal ultrasound examinations of the pelvis were performed. Transabdominal technique was performed for global imaging of the pelvis including uterus, ovaries, adnexal regions, and pelvic cul-de-sac. It was necessary to proceed with endovaginal exam following the transabdominal exam to visualize the left and right ovaries. COMPARISON:  None Available. FINDINGS: Uterus Measurements: 9.4 x 4.5 x 5.5 cm = volume: 122 mL. No fibroids or other mass visualized. Endometrium Thickness: 0.6 cm.  No focal abnormality visualized. Right ovary Measurements: 4.1 x 2.8 x 3.7 cm = volume: 22 mL. The right ovary is almost completely occupied by a simple appearing cyst measuring 3.4 x 2.6 x 2.2 cm. Arterial and venous Doppler flow is present to the right ovary. Left  ovary Measurements: 2.2 x 1.1 x 1.5 cm = volume: 2 mL. Normal appearance/no adnexal mass. Other findings No abnormal free fluid. IMPRESSION: 1. The right ovary is almost completely occupied by a simple appearing cyst measuring 3.4 x 2.6 x 2.2 cm. This is almost certainly benign in a reproductive age female. Please note that intermittent or incomplete ovarian torsion can not be strictly excluded in any enlarged ovary No followup imaging recommended. Note: This recommendation does not apply to premenarchal patients or to those with increased risk (genetic, family history, elevated tumor markers or other high-risk factors) of ovarian cancer. Reference: Radiology 2019 Nov; 293(2):359-371. 2. Otherwise unremarkable pelvic ultrasound. Electronically Signed   By: Delanna Ahmadi M.D.   On: 01/01/2023 17:54    Procedures Procedures    Medications Ordered in ED Medications - No data to display  ED Course/ Medical Decision Making/ A&P                           Medical Decision Making Amount and/or Complexity of Data Reviewed Labs: ordered.  Risk Prescription drug management.   Patient presents to the ED with complaints of pelvic pain & vaginal bleeding, this involves an extensive number of treatment options, and is a complaint that carries with it a high risk of complications and morbidity. Nontoxic, vitals w/ elevated BP- improved and tachycardia at times also improved. Suprapubic TTP and right adnexal tenderness on exam.   Ddx including but not limited to: ovarian cyst, ovarian torsion,   Additional history obtained:  Chart/nursing notes reviewed.  External records viewed including: UC visit note.   Lab Tests:  I viewed & interpreted labs including:  CBC: unremarkable.  CMP: hyperglycemia without acidosis or anion gap elevation. No critical electrolyte derangement.  Lipase: WNL UA: blood present likely from vaginal bleeding- no UTI.  Preg test: Negative  Imaging Studies:  I ordered and  viewed the following imaging, agree with radiologist impression:  [place]: 1. The right ovary is almost completely occupied by a simple appearing cyst measuring 3.4 x 2.6 x 2.2 cm. This is almost certainly benign in a reproductive age female. Please note that intermittent or incomplete ovarian torsion can not be strictly excluded in any enlarged ovary No followup imaging recommended. Note: This recommendation does not apply to premenarchal patients or to those with increased risk (genetic, family history, elevated tumor markers or other high-risk factors) of ovarian  cancer. Reference: Radiology 2019 Nov; 293(2):359-371. 2. Otherwise unremarkable pelvic ultrasound  ED Course:  I ordered medications including morphine/zofran/fluids for symptomatic management.   00:45: CONSULT: Discussed patient presentation/work up with obgyn Dr. Elly Modena who has reviewed imaging/labs, relays unlikely to be torsion, will need to follow up in clinic. Appreciate input.   Toradol ordered.   0340: RE-EVAL: Patient woken from sleep, resting comfortably, feels much better.  Repeat abdominal exam remains without peritoneal signs, lower suspicion for acute surgical process, discussed possibility of ovarian torsion with OB/GYN who felt this was unlikely and recommended clinic outpatient follow-up.  Patient without specific concern for STI, STD test pending, feel that PID is less likely.  Pregnancy test is negative therefore doubt ectopic.  She was symptomatic improvement.  She is reasonable for discharge home with supportive care and follow-up with her primary care provider as well as OB/GYN.  Suggest the need for recheck of her blood sugar with primary care given it was elevated in the ED today, she does not show findings of DKA/HHS at this time. I discussed results, treatment plan, need for follow-up, and return precautions with the patient. Provided opportunity for questions, patient confirmed understanding and is in agreement with  plan.   Based on patient's chief complaint, I considered admission might be necessary, however after reassuring ED workup feel patient is reasonable for discharge.   Discussed with attending- in agreement.   Portions of this note were generated with [employer]. Dictation errors may occur despite best attempts at proofreading.  Final Clinical Impression(s) / ED Diagnoses Final diagnoses:  Pelvic pain  Vaginal bleeding  Hyperglycemia    Rx / DC Orders ED Discharge Orders          Ordered    naproxen (NAPROSYN) 500 MG tablet  2 times daily PRN        01/02/23 0342    ondansetron (ZOFRAN-ODT) 4 MG disintegrating tablet  Every 8 hours PRN        01/02/23 0342              Odell Choung, Glynda Jaeger, PA-C 01/02/23 0345    Quintella Reichert, MD 01/03/23 [phone]

## 2023-01-01 NOTE — ED Provider Notes (Signed)
[facility]    CSN: [id number] Arrival date & time: 01/01/23  1331      History   Chief Complaint Chief Complaint  Patient presents with   Vaginal Bleeding    HPI Linda Carlson is a 42 y.o. female.   Patient presents with abnormal vaginal bleeding, lower abdominal cramping, back pain, nausea without vomiting.  Patient reports that vaginal bleeding is light and that she does not have to change her pad often.  Although, she reports abdominal pain is significant and today has been the worst day.  She developed nausea without vomiting as well given lower abdominal cramping.  Pain has been radiating around to the back as well.  Patient denies any associated fever, vaginal discharge, hematuria, dysuria, urinary frequency.  Patient has 1 sexual partner and denies exposure to STD.  Patient has had unprotected intercourse.  Does not use any form of birth control given that she has had a tubal ligation.  Patient reports that she been having monthly menstrual cycles.  This vaginal bleeding started off as a regular menstrual cycle but has not resolved.   Vaginal Bleeding   Past Medical History:  Diagnosis Date   ANEMIA, HX OF 03/14/2011   Qualifier: Diagnosis of  By: Danise Mina  MD, Javier     COVID-19 virus infection 03/2020   Diabetes mellitus without complication ([place])    DIABETES MELLITUS, GESTATIONAL, HX OF 03/14/2011   Qualifier: Diagnosis of  By: Danise Mina  MD, Garlon Hatchet     Fatty liver    Seasonal allergic rhinitis     Patient Active Problem List   Diagnosis Date Noted   Sepsis ([place]) 10/29/2021   UTI (urinary tract infection) 10/28/2021   Diabetes mellitus without complication ([place])    Hypokalemia    Adjustment disorder 01/31/2013   Diabetes mellitus type 2, uncontrolled, with complications 02/58/5277   TRANSAMINASES, SERUM, ELEVATED 03/16/2011    Past Surgical History:  Procedure Laterality Date   TUBAL LIGATION Bilateral     OB History   No obstetric  history on file.      Home Medications    Prior to Admission medications   Medication Sig Start Date End Date Taking? Authorizing Provider  cetirizine (ZYRTEC) 10 MG tablet Take 1 tablet (10 mg total) by mouth daily. 08/13/22   Teodora Medici, FNP  fluticasone (FLONASE) 50 MCG/ACT nasal spray Place 1 spray into both nostrils daily. 12/15/22   Melynda Ripple, NP  glimepiride (AMARYL) 1 MG tablet Take 1 tablet (1 mg total) by mouth daily with breakfast. Patient not taking: No sig reported 07/26/20   Ria Bush, MD  ibuprofen (ADVIL) 800 MG tablet Take 1 tablet (800 mg total) by mouth every 8 (eight) hours as needed for mild pain or moderate pain. 07/31/22   Nani Gasser, MD  metFORMIN (GLUCOPHAGE) 500 MG tablet Take 500 mg by mouth 2 (two) times daily. Patient not taking: Reported on 10/28/2021    [provider]  metFORMIN (GLUCOPHAGE) 500 MG tablet Take 1 tablet by mouth 2 (two) times daily.    [provider]  Multiple Vitamins-Minerals (MULTIVITAMIN PO) Take 1 tablet by mouth daily. Patient not taking: Reported on 10/28/2021    [provider]  promethazine-dextromethorphan (PROMETHAZINE-DM) 6.25-15 MG/5ML syrup Take 5 mLs by mouth 4 (four) times daily as needed for cough. 12/15/22   Melynda Ripple, NP    Family History Family History  Problem Relation Age of Onset   Hypertension Mother  Social History Social History   Tobacco Use   Smoking status: Never   Smokeless tobacco: Never  Substance Use Topics   Alcohol use: No   Drug use: No     Allergies   Patient has no known allergies.   Review of Systems Review of Systems Per HPI  Physical Exam Triage Vital Signs ED Triage Vitals [01/01/23 1356]  Enc Vitals Group     BP (!) 147/83     Pulse Rate 97     Resp 16     Temp (!) 97.5 F (36.4 C)     Temp Source Oral     SpO2 98 %     Weight      Height      Head Circumference      Peak Flow      Pain Score 7     Pain Loc       Pain Edu?      Excl. in GC?    No data found.  Updated Vital Signs BP (!) 147/83 (BP Location: Left Arm)   Pulse 97   Temp (!) 97.5 F (36.4 C) (Oral)   Resp 16   LMP 12/17/2022   SpO2 98%   Visual Acuity Right Eye Distance:   Left Eye Distance:   Bilateral Distance:    Right Eye Near:   Left Eye Near:    Bilateral Near:     Physical Exam Constitutional:      General: She is not in acute distress.    Appearance: Normal appearance. She is not toxic-appearing or diaphoretic.  HENT:     Head: Normocephalic and atraumatic.  Eyes:     Extraocular Movements: Extraocular movements intact.     Conjunctiva/sclera: Conjunctivae normal.  Cardiovascular:     Rate and Rhythm: Normal rate and regular rhythm.     Pulses: Normal pulses.     Heart sounds: Normal heart sounds.  Pulmonary:     Effort: Pulmonary effort is normal. No respiratory distress.     Breath sounds: Normal breath sounds.  Abdominal:     General: Bowel sounds are normal. There is no distension.     Palpations: Abdomen is soft.     Tenderness: There is abdominal tenderness.     Comments: Patient is significantly tender to palpation to lower abdomen.  Neurological:     General: No focal deficit present.     Mental Status: She is alert and oriented to person, place, and time. Mental status is at baseline.  Psychiatric:        Mood and Affect: Mood normal.        Behavior: Behavior normal.        Thought Content: Thought content normal.        Judgment: Judgment normal.      UC Treatments / Results  Labs (all labs ordered are listed, but only abnormal results are displayed) Labs Reviewed  POCT URINE PREGNANCY    EKG   Radiology No results found.  Procedures Procedures (including critical care time)  Medications Ordered in UC Medications - No data to display  Initial Impression / Assessment and Plan / UC Course  I have reviewed the triage vital signs and the nursing notes.  Pertinent labs  & imaging results that were available during my care of the patient were reviewed by me and considered in my medical decision making (see chart for details).     Urine pregnancy test was negative.  Given how significantly  tender to palpation on abdomen patient is as well as associated back pain, I do that this warrants further evaluation and management at the hospital as she may need stat blood work as well as more advanced imaging that cannot be provided here at the urgent care.  Patient was advised to go to the ER for further evaluation and management and was agreeable with plan.  Vital signs and patient stable at discharge.  Agree with patient self transport to the hospital. Final Clinical Impressions(s) / UC Diagnoses   Final diagnoses:  Vaginal bleeding  Lower abdominal pain  Acute low back pain without sciatica, unspecified back pain laterality     Discharge Instructions      Pregnancy test was negative.  Please go to the emergency department as soon as you leave urgent care for further evaluation and management.    ED Prescriptions   None    PDMP not reviewed this encounter.   Teodora Medici, [place] 01/01/23 1431

## 2023-01-01 NOTE — ED Notes (Signed)
Patient is being discharged from the Urgent Care and sent to the Emergency Department via self . Per Hildred Alamin, patient is in need of higher level of care due to vaginal bleeding. Patient is aware and verbalizes understanding of plan of care.  Vitals:   01/01/23 1356  BP: (!) 147/83  Pulse: 97  Resp: 16  Temp: (!) 97.5 F (36.4 C)  SpO2: 98%

## 2023-01-02 LAB — GC/CHLAMYDIA PROBE AMP (~~LOC~~) NOT AT ARMC
Chlamydia: NEGATIVE
Comment: NEGATIVE
Comment: NORMAL
Neisseria Gonorrhea: NEGATIVE

## 2023-01-02 LAB — WET PREP, GENITAL
Clue Cells Wet Prep HPF POC: NONE SEEN
Sperm: NONE SEEN
Trich, Wet Prep: NONE SEEN
WBC, Wet Prep HPF POC: <10 (ref <10)

## 2023-01-02 LAB — RPR: RPR Ser Ql: NONREACTIVE

## 2023-01-02 MED ORDER — NAPROXEN 500 MG PO TABS: 500.0000 mg | ORAL_TABLET | Freq: Two times a day (BID) | ORAL | 0 refills | Status: DC | PRN

## 2023-01-02 MED ORDER — KETOROLAC TROMETHAMINE 15 MG/ML IJ SOLN
15.0000 mg | Freq: Once | INTRAMUSCULAR | Status: AC
  Administered 2023-01-02: 15 mg via INTRAVENOUS
  Filled 2023-01-02: qty 1

## 2023-01-02 MED ORDER — ONDANSETRON 4 MG PO TBDP: 4.0000 mg | ORAL_TABLET | Freq: Three times a day (TID) | ORAL | 0 refills | Status: DC | PRN

## 2023-01-02 NOTE — Discharge Instructions (Addendum)
You were seen in the emergency department for pelvic pain.  Your labs show that your blood sugar was high at 345, it is very important that you have this rechecked with your primary care provider as your diabetes medications may need to be adjusted.  Your ultrasound did show a cyst in the area of your right ovary, which is likely contributing to your pain.  We are sending you home with Zofran to take every 8 hours as needed for nausea/vomiting and naproxen to take every 12 hours as needed for pain.  Take naproxen with food as it can cause stomach upset and or stomach bleeding.  Do not take other NSAIDs with this medicine such as Motrin, ibuprofen, Advil, Aleve, Goody powder, but call meloxicam etc. ice or similar medicines.  You may take Tylenol with these medicines per over-the-counter dosing.  We have prescribed you new medication(s) today. Discuss the medications prescribed today with your pharmacist as they can have adverse effects and interactions with your other medicines including over the counter and prescribed medications. Seek medical evaluation if you start to experience new or abnormal symptoms after taking one of these medicines, seek care immediately if you start to experience difficulty breathing, feeling of your throat closing, facial swelling, or rash as these could be indications of a more serious allergic reaction  We would like you to follow-up with primary care for recheck of your blood sugar.  We would also like you to follow-up with the [facility] soon as possible for recheck of your symptoms.  Return to the emergency department for any new or worsening symptoms including but not limited to new or worsening pain, fever, passing out, inability to keep fluids down, change in location/quality of your pain, or any other concerns.  You received IV narcotic pain medication in the emergency department, do not drive or operate heavy machinery as this medication is sedating.

## 2023-01-05 ENCOUNTER — Telehealth: Payer: Self-pay | Admitting: *Deleted

## 2023-01-05 NOTE — Telephone Encounter (Signed)
Transition Care Management Unsuccessful Follow-up Telephone Call  Date of discharge and from where:  01/02/2023 [facility]  Attempts:  1st Attempt  Reason for unsuccessful TCM follow-up call:  Voice mail full

## 2023-01-21 ENCOUNTER — Other Ambulatory Visit (HOSPITAL_COMMUNITY)
Admission: RE | Admit: 2023-01-21 | Discharge: 2023-01-21 | Disposition: A | Discharge status: Home / Self Care | Payer: Medicaid Other | Source: Ambulatory Visit | Attending: Obstetrics and Gynecology | Admitting: Obstetrics and Gynecology

## 2023-01-21 ENCOUNTER — Ambulatory Visit (INDEPENDENT_AMBULATORY_CARE_PROVIDER_SITE_OTHER): Payer: Medicaid Other | Admitting: Obstetrics and Gynecology

## 2023-01-21 ENCOUNTER — Encounter: Payer: Self-pay | Admitting: Obstetrics and Gynecology

## 2023-01-21 VITALS — BP 112/72 | HR 66 | Ht 64.0 in | Wt 165.0 lb

## 2023-01-21 DIAGNOSIS — Z01419 Encounter for gynecological examination (general) (routine) without abnormal findings: Secondary | ICD-10-CM

## 2023-01-21 DIAGNOSIS — N939 Abnormal uterine and vaginal bleeding, unspecified: Secondary | ICD-10-CM | POA: Diagnosis not present

## 2023-01-21 DIAGNOSIS — R102 Pelvic and perineal pain unspecified side: Secondary | ICD-10-CM

## 2023-01-21 DIAGNOSIS — N83201 Unspecified ovarian cyst, right side: Secondary | ICD-10-CM

## 2023-01-21 DIAGNOSIS — Z124 Encounter for screening for malignant neoplasm of cervix: Secondary | ICD-10-CM | POA: Insufficient documentation

## 2023-01-21 NOTE — Progress Notes (Signed)
42 y.o. [id number] Legally Separated White or Caucasian Not Hispanic or Latino female here for a cyst on her ovary. She has also been bleeding off and on since 12/20.   Her last normal cycle was around the end of November. Her cycle in 12/23 started on time, just like a normal cycle. It just didn't stop. Bleeding was moderate to heavy, she was changing a pad every few hours. Her bleeding stopped for a little more than a week, then she started bleeding again 3 days ago. It was heavy for a day, then it stopped. Not currently bleeding.  Period Cycle (Days): 28 Period Duration (Days): 5 Period Pattern: Regular Menstrual Flow: Moderate Menstrual Control: Tampon Menstrual Control Change Freq (Hours): 3 Dysmenorrhea: None  The patient was seen in the ER on 01/01/23 with AUB, abdominal pain, N/V.  She had negative cervical cultures, a negative UPT, normal CBC, normal CMP other than a glucose of 345. Since then her pain has improved, just intermittent, random sharp pains in her RLQ. Some menstrual like cramps as well.   U/S on 01/01/23 she had a 3.4 cm simple right ovarian cyst. No f/u was recommended.   She c/o dry skin, no other thyroid c/o.  She broke up with her boyfriend over a month ago. Negative STD since then.   She checks her FS in the am, running between 150-200. She will f/u with her primary.    Patient's last menstrual period was 12/17/2022.          Sexually active: No.  The current method of family planning is tubal ligation.    Exercising: Yes.    The patient has a physically strenuous job, but has no regular exercise apart from work.  Smoker:  no  Health Maintenance: Pap:  She thinks about 7 years ago.  History of abnormal Pap:  no MMG:  none  BMD:   none  Colonoscopy: none  TDaP:  unsure  Gardasil: no    reports that she has never smoked. She has never used smokeless tobacco. She reports that she does not drink alcohol and does not use drugs. Occasional ETOH. She works as a  Chief Operating Officer. Daughters are 50 and 30.   Past Medical History:  Diagnosis Date   ANEMIA, HX OF 03/14/2011   Qualifier: Diagnosis of  By: Danise Mina  MD, Garlon Hatchet     COVID-19 virus infection 03/2020   Diabetes mellitus without complication ([place])    DIABETES MELLITUS, GESTATIONAL, HX OF 03/14/2011   Qualifier: Diagnosis of  By: Danise Mina  MD, Garlon Hatchet     Fatty liver    Seasonal allergic rhinitis     Past Surgical History:  Procedure Laterality Date   TUBAL LIGATION Bilateral     Current Outpatient Medications  Medication Sig Dispense Refill   acetaminophen (TYLENOL) 500 MG tablet Take 500 mg by mouth every 6 (six) hours as needed for moderate pain.     fluticasone (FLONASE) 50 MCG/ACT nasal spray Place 1 spray into both nostrils daily. (Patient taking differently: Place 1 spray into both nostrils daily as needed for allergies.) 15.8 mL 0   ibuprofen (ADVIL) 200 MG tablet Take 200 mg by mouth every 6 (six) hours as needed for moderate pain.     metFORMIN (GLUCOPHAGE) 500 MG tablet Take 1 tablet by mouth 2 (two) times daily.     ondansetron (ZOFRAN-ODT) 4 MG disintegrating tablet Take 1 tablet (4 mg total) by mouth every 8 (eight) hours as needed for nausea or vomiting. 8  tablet 0   No current facility-administered medications for this visit.    Family History  Problem Relation Age of Onset   Ovarian cancer Mother    Diabetes Mother    Hypertension Mother    Diabetes Father    Heart attack Father    Breast cancer Neg Hx     Review of Systems  Genitourinary:  Positive for pelvic pain.  All other systems reviewed and are negative.   Exam:   BP 112/72   Pulse 66   Ht 5\' 4"  (1.626 m)   Wt 165 lb (74.8 kg)   LMP 12/17/2022   SpO2 100%   BMI 28.32 kg/m   Weight change: @WEIGHTCHANGE @ Height:   Height: 5\' 4"  (162.6 cm)  Ht Readings from Last 3 Encounters:  01/21/23 5\' 4"  (1.626 m)  01/01/23 5\' 4"  (1.626 m)  07/30/22 5\' 5"  (1.651 m)    General appearance: alert, cooperative  and appears stated age Head: Normocephalic, without obvious abnormality, atraumatic Neck: no adenopathy, supple, symmetrical, trachea midline and thyroid normal to inspection and palpation Breasts: normal appearance, no masses or tenderness Abdomen: soft, non-tender; non distended,  no masses,  no organomegaly Extremities: extremities normal, atraumatic, no cyanosis or edema Skin: Skin color, texture, turgor normal. No rashes or lesions Lymph nodes: Cervical, supraclavicular, and axillary nodes normal. No abnormal inguinal nodes palpated Neurologic: Grossly normal   Pelvic: External genitalia:  no lesions              Urethra:  normal appearing urethra with no masses, tenderness or lesions              Bartholins and Skenes: normal                 Vagina: normal appearing vagina with normal color and discharge, no lesions              Cervix: no cervical motion tenderness and no lesions               Bimanual Exam:  Uterus:   no masses or tenderness              Adnexa: no mass, fullness, tenderness               Rectovaginal: Confirms               Anus:  normal sphincter tone, no lesions  Gae Dry, CMA chaperoned for the exam.  1. Well woman exam Reviewed breast self exam # for mammogram given She will f/u with her primary for management of her DM  2. Screening for cervical cancer - Cytology - PAP  3. Abnormal uterine bleeding (AUB) One abnormal cycle. Currently not bleeding. Negative UPT. U/S with a 3.4 cm simple right ovarian cyst - TSH -Calendar bleeding, call if AUB persists.   4. Pelvic pain U/S with simple 3.4 cm right ovarian cyst earlier this month. Her pain is improving. She will reach out if it doesn't continue to improve  5. Cyst of right ovary C/w a follicle. No f/u is needed

## 2023-01-22 ENCOUNTER — Encounter: Payer: Self-pay | Admitting: Obstetrics and Gynecology

## 2023-01-22 LAB — TSH: TSH: 0.56 mIU/L

## 2023-02-02 LAB — CYTOLOGY - PAP
Comment: NEGATIVE
Diagnosis: UNDETERMINED — AB
High risk HPV: POSITIVE — AB

## 2023-02-03 ENCOUNTER — Other Ambulatory Visit: Payer: Self-pay

## 2023-02-03 DIAGNOSIS — R87610 Atypical squamous cells of undetermined significance on cytologic smear of cervix (ASC-US): Secondary | ICD-10-CM

## 2023-03-03 ENCOUNTER — Other Ambulatory Visit: Payer: Self-pay | Admitting: Family Medicine

## 2023-03-03 DIAGNOSIS — Z1231 Encounter for screening mammogram for malignant neoplasm of breast: Secondary | ICD-10-CM

## 2023-03-04 ENCOUNTER — Encounter: Payer: Self-pay | Admitting: Obstetrics and Gynecology

## 2023-03-04 ENCOUNTER — Ambulatory Visit (INDEPENDENT_AMBULATORY_CARE_PROVIDER_SITE_OTHER): Payer: Medicaid Other | Admitting: Obstetrics and Gynecology

## 2023-03-04 ENCOUNTER — Other Ambulatory Visit: Payer: Self-pay | Admitting: Obstetrics and Gynecology

## 2023-03-04 ENCOUNTER — Other Ambulatory Visit (HOSPITAL_COMMUNITY)
Admission: RE | Admit: 2023-03-04 | Discharge: 2023-03-04 | Disposition: A | Discharge status: Home / Self Care | Payer: Medicaid Other | Source: Ambulatory Visit | Attending: Obstetrics and Gynecology | Admitting: Obstetrics and Gynecology

## 2023-03-04 VITALS — BP 130/78 | HR 67 | Wt 167.0 lb

## 2023-03-04 DIAGNOSIS — R102 Pelvic and perineal pain: Secondary | ICD-10-CM

## 2023-03-04 DIAGNOSIS — N939 Abnormal uterine and vaginal bleeding, unspecified: Secondary | ICD-10-CM | POA: Diagnosis not present

## 2023-03-04 DIAGNOSIS — R87810 Cervical high risk human papillomavirus (HPV) DNA test positive: Secondary | ICD-10-CM | POA: Insufficient documentation

## 2023-03-04 DIAGNOSIS — R87610 Atypical squamous cells of undetermined significance on cytologic smear of cervix (ASC-US): Secondary | ICD-10-CM | POA: Diagnosis present

## 2023-03-04 DIAGNOSIS — Z8639 Personal history of other endocrine, nutritional and metabolic disease: Secondary | ICD-10-CM | POA: Insufficient documentation

## 2023-03-04 NOTE — Progress Notes (Signed)
GYNECOLOGY  VISIT   HPI: 42 y.o.   Divorced White or Caucasian Not Hispanic or Latino  female   [phone] with Patient's last menstrual period was 03/01/2023.   here for colposcopy.  Recent pap returned with ASCUS/+HPV  She has been having irregular bleeding since 12/23. Since her last visit, she has been having light bleeding off and on, no real cycle.   The patient was seen in the ER on 01/01/23 with AUB, abdominal pain, N/V.  She had negative cervical cultures, a negative UPT, normal CBC, normal CMP other than a glucose of 345. U/S on 01/01/23 she had a 3.4 cm simple right ovarian cyst. No f/u was recommended.   She is continuing to have sharp pain in the RLQ, it has been constant cramping or sharp pain for a couple of months. Aleve helps some.   01/21/23 normal TSH.  GYNECOLOGIC HISTORY: Patient's last menstrual period was 03/01/2023. Contraception:tubal ligation  Menopausal hormone therapy: none         OB History     Gravida  2   Para  2   Term  2   Preterm      AB      Living  2      SAB      IAB      Ectopic      Multiple      Live Births  2              Patient Active Problem List   Diagnosis Date Noted   Sepsis ([place]) 10/29/2021   UTI (urinary tract infection) 10/28/2021   Diabetes mellitus without complication ([place])    Hypokalemia    Adjustment disorder 01/31/2013   Diabetes mellitus type 2, uncontrolled, with complications [id number]   TRANSAMINASES, SERUM, ELEVATED 03/16/2011    Past Medical History:  Diagnosis Date   ANEMIA, HX OF 03/14/2011   Qualifier: Diagnosis of  By: Danise Mina  MD, Javier     COVID-19 virus infection 03/2020   Diabetes mellitus without complication ([place])    DIABETES MELLITUS, GESTATIONAL, HX OF 03/14/2011   Qualifier: Diagnosis of  By: Danise Mina  MD, Garlon Hatchet     Fatty liver    Seasonal allergic rhinitis     Past Surgical History:  Procedure Laterality Date   TUBAL LIGATION Bilateral     Current Outpatient  Medications  Medication Sig Dispense Refill   acetaminophen (TYLENOL) 500 MG tablet Take 500 mg by mouth every 6 (six) hours as needed for moderate pain.     fluticasone (FLONASE) 50 MCG/ACT nasal spray Place 1 spray into both nostrils daily. (Patient taking differently: Place 1 spray into both nostrils daily as needed for allergies.) 15.8 mL 0   ibuprofen (ADVIL) 200 MG tablet Take 200 mg by mouth every 6 (six) hours as needed for moderate pain.     metFORMIN (GLUCOPHAGE) 500 MG tablet Take 1 tablet by mouth 2 (two) times daily.     ondansetron (ZOFRAN-ODT) 4 MG disintegrating tablet Take 1 tablet (4 mg total) by mouth every 8 (eight) hours as needed for nausea or vomiting. 8 tablet 0   No current facility-administered medications for this visit.     ALLERGIES: Patient has no known allergies.  Family History  Problem Relation Age of Onset   Ovarian cancer Mother    Diabetes Mother    Hypertension Mother    Diabetes Father    Heart attack Father    Breast cancer Neg Hx  Social History   Socioeconomic History   Marital status: Divorced    Spouse name: Not on file   Number of children: Not on file   Years of education: Not on file   Highest education level: Not on file  Occupational History   Not on file  Tobacco Use   Smoking status: Never   Smokeless tobacco: Never  Vaping Use   Vaping Use: Never used  Substance and Sexual Activity   Alcohol use: No    Comment: OCC   Drug use: No   Sexual activity: Not Currently    Birth control/protection: Abstinence  Other Topics Concern   Not on file  Social History Narrative   Not on file   Social Determinants of Health   Financial Resource Strain: Not on file  Food Insecurity: Not on file  Transportation Needs: Not on file  Physical Activity: Not on file  Stress: Not on file  Social Connections: Not on file  Intimate Partner Violence: Not on file    Review of Systems  All other systems reviewed and are  negative.   PHYSICAL EXAMINATION:    BP 130/78   Pulse 67   Wt 167 lb (75.8 kg)   LMP 03/01/2023   SpO2 100%   BMI 28.67 kg/m     General appearance: alert, cooperative and appears stated age  Pelvic: External genitalia:  no lesions              Urethra:  normal appearing urethra with no masses, tenderness or lesions              Bartholins and Skenes: normal                 Vagina: normal appearing vagina with normal color and discharge, no lesions              Cervix: no lesions  Colposcopy: unsatisfactory, no aceto-white changes. Lugols examination of the cervix and upper vagina was performed. There was decreased lugols uptake surrounding the external os in a clover pattern. A cervical biopsy was taken at 12 o'clock. An ECC was done. Silver nitrate was used for hemostasis.                Chaperone was present for exam.  1. ASCUS with positive high risk HPV cervical - Colposcopy - Surgical pathology( [place]/ [facility])  2. Abnormal uterine bleeding (AUB) - US PELVIS TRANSVAGINAL NON-OB (TV ONLY); Future -we discussed possible endometrial biopsy  3. Pelvic pain - US PELVIS TRANSVAGINAL NON-OB (TV ONLY); Future

## 2023-03-06 LAB — SURGICAL PATHOLOGY

## 2023-03-16 ENCOUNTER — Ambulatory Visit
Admission: RE | Admit: 2023-03-16 | Discharge: 2023-03-16 | Disposition: A | Discharge status: Home / Self Care | Payer: Medicaid Other | Source: Ambulatory Visit

## 2023-03-16 DIAGNOSIS — Z1231 Encounter for screening mammogram for malignant neoplasm of breast: Secondary | ICD-10-CM

## 2023-03-18 ENCOUNTER — Other Ambulatory Visit: Payer: Self-pay | Admitting: Family Medicine

## 2023-03-18 DIAGNOSIS — R928 Other abnormal and inconclusive findings on diagnostic imaging of breast: Secondary | ICD-10-CM

## 2023-03-31 ENCOUNTER — Ambulatory Visit: Payer: Medicaid Other

## 2023-03-31 ENCOUNTER — Ambulatory Visit
Admission: RE | Admit: 2023-03-31 | Discharge: 2023-03-31 | Disposition: A | Discharge status: Home / Self Care | Payer: Medicaid Other | Source: Ambulatory Visit | Attending: Family Medicine | Admitting: Family Medicine

## 2023-03-31 DIAGNOSIS — R928 Other abnormal and inconclusive findings on diagnostic imaging of breast: Secondary | ICD-10-CM

## 2023-04-16 ENCOUNTER — Encounter: Payer: Self-pay | Admitting: Obstetrics and Gynecology

## 2023-04-16 ENCOUNTER — Other Ambulatory Visit (HOSPITAL_COMMUNITY)
Admission: RE | Admit: 2023-04-16 | Discharge: 2023-04-16 | Disposition: A | Discharge status: Home / Self Care | Payer: Medicaid Other | Source: Ambulatory Visit | Attending: Obstetrics and Gynecology | Admitting: Obstetrics and Gynecology

## 2023-04-16 ENCOUNTER — Ambulatory Visit: Payer: Medicaid Other | Admitting: Obstetrics and Gynecology

## 2023-04-16 ENCOUNTER — Ambulatory Visit (INDEPENDENT_AMBULATORY_CARE_PROVIDER_SITE_OTHER): Payer: Medicaid Other

## 2023-04-16 VITALS — BP 112/68 | HR 78 | Wt 165.0 lb

## 2023-04-16 DIAGNOSIS — N939 Abnormal uterine and vaginal bleeding, unspecified: Secondary | ICD-10-CM

## 2023-04-16 DIAGNOSIS — R102 Pelvic and perineal pain: Secondary | ICD-10-CM

## 2023-04-16 MED ORDER — MEDROXYPROGESTERONE ACETATE 10 MG PO TABS: ORAL_TABLET | ORAL | 0 refills | Status: DC

## 2023-04-16 NOTE — Progress Notes (Signed)
GYNECOLOGY  VISIT   HPI: 42 y.o.   Divorced White or Caucasian Not Hispanic or Latino  female   [phone] with No LMP recorded. (Menstrual status: Irregular Periods).   here for evaluation of AUB and pelvic pain  The patient was seen in the ER on 01/01/23 with AUB, abdominal pain, N/V.  She had negative cervical cultures, a negative UPT, normal CBC, normal CMP other than a glucose of 345. U/S on 01/01/23 she had a 3.4 cm simple right ovarian cyst. No f/u was recommended.   At the time of her visit last month she c/o continuation of RLQ abdominal pain and continued AUB. Here for an ultrasound and possible endometrial biopsy.  Cycles were monthly and normal up until 11/23. In 12/23 she had a prolonged heavy bleed. Since then she has been bleeding off and on, only going 3-4 days without bleed. Bleeding varies from light to heavy. She has bad cramps and can have random sharp pains on either side of her lower abdomen/pelvis.   She has DM, she is adjusting her metformin. FS at home, fasting glucose today 238, ranges from 180-320. She has an appointment next month to discuss management with her primary.   GYNECOLOGIC HISTORY: No LMP recorded. (Menstrual status: Irregular Periods). Contraception:Tubal Ligation  Menopausal hormone therapy: none         OB History     Gravida  2   Para  2   Term  2   Preterm      AB      Living  2      SAB      IAB      Ectopic      Multiple      Live Births  2              Patient Active Problem List   Diagnosis Date Noted   History of diabetes mellitus 03/04/2023   Sepsis 10/29/2021   UTI (urinary tract infection) 10/28/2021   Diabetes mellitus without complication    Hypokalemia    Adjustment disorder 01/31/2013   Diabetes mellitus type 2, uncontrolled, with complications 03/16/2011   TRANSAMINASES, SERUM, ELEVATED 03/16/2011    Past Medical History:  Diagnosis Date   ANEMIA, HX OF 03/14/2011   Qualifier: Diagnosis of  By:  Sharen Hones  MD, Javier     COVID-19 virus infection 03/2020   Diabetes mellitus without complication    DIABETES MELLITUS, GESTATIONAL, HX OF 03/14/2011   Qualifier: Diagnosis of  By: Sharen Hones  MD, Wynona Canes     Fatty liver    History of diabetes mellitus    Seasonal allergic rhinitis     Past Surgical History:  Procedure Laterality Date   TUBAL LIGATION Bilateral     Current Outpatient Medications  Medication Sig Dispense Refill   acetaminophen (TYLENOL) 500 MG tablet Take 500 mg by mouth every 6 (six) hours as needed for moderate pain.     fluticasone (FLONASE) 50 MCG/ACT nasal spray Place 1 spray into both nostrils daily. (Patient taking differently: Place 1 spray into both nostrils daily as needed for allergies.) 15.8 mL 0   ibuprofen (ADVIL) 200 MG tablet Take 200 mg by mouth every 6 (six) hours as needed for moderate pain.     metFORMIN (GLUCOPHAGE) 500 MG tablet Take 1 tablet by mouth 2 (two) times daily.     ondansetron (ZOFRAN-ODT) 4 MG disintegrating tablet Take 1 tablet (4 mg total) by mouth every 8 (eight) hours as needed for  nausea or vomiting. 8 tablet 0   No current facility-administered medications for this visit.     ALLERGIES: Patient has no known allergies.  Family History  Problem Relation Age of Onset   Ovarian cancer Mother    Diabetes Mother    Hypertension Mother    Diabetes Father    Heart attack Father    Breast cancer Neg Hx     Social History   Socioeconomic History   Marital status: Divorced    Spouse name: Not on file   Number of children: Not on file   Years of education: Not on file   Highest education level: Not on file  Occupational History   Not on file  Tobacco Use   Smoking status: Never   Smokeless tobacco: Never  Vaping Use   Vaping Use: Never used  Substance and Sexual Activity   Alcohol use: No    Comment: OCC   Drug use: No   Sexual activity: Not Currently    Birth control/protection: Abstinence  Other Topics Concern    Not on file  Social History Narrative   Not on file   Social Determinants of Health   Financial Resource Strain: Not on file  Food Insecurity: Not on file  Transportation Needs: Not on file  Physical Activity: Not on file  Stress: Not on file  Social Connections: Not on file  Intimate Partner Violence: Not on file    ROS  PHYSICAL EXAMINATION:    There were no vitals taken for this visit.    General appearance: alert, cooperative and appears stated age  Pelvic: External genitalia:  no lesions, slight erythema (denies symptoms)              Urethra:  normal appearing urethra with no masses, tenderness or lesions              Bartholins and Skenes: normal                 Vagina: normal appearing vagina with normal color and discharge, no lesions              Cervix: no lesions               The risks of endometrial biopsy were reviewed and a consent was obtained.  A speculum was placed in the vagina and the cervix was cleansed with betadine. A tenaculum was placed on the cervix and the pipelle was placed into the endometrial cavity. The uterus sounded to ~9 cm. The endometrial biopsy was performed, taking care to get a representative sample, sampling 360 degrees of the uterine cavity. A small to moderate amount of tissue was obtained. The tenaculum and speculum were removed. There were no complications.   Chaperone was present for exam.  1. Abnormal uterine bleeding (AUB) Thickened endometrium on U/S, no obvious intracavitary defects. Less tissue than expected on endometrial biopsy (concerning for possible polyp) - Surgical pathology( [place]/ [facility]) - medroxyPROGESTERone (PROVERA) 10 MG tablet; Take one tablet po qd, if bleeding doesn't slow down can increase to 1 tablet po BID. Continue for 10 days  Dispense: 20 tablet; Refill: 0 -Further plans once the biopsy returns. May need a sonohysterogram.  -We discussed cyclic provera vs contraception for management of AUB (she  is an uncontrolled diabetic)  2. Pelvic pain No finding on u/s to explain her pain.

## 2023-04-16 NOTE — Patient Instructions (Signed)

## 2023-04-20 LAB — SURGICAL PATHOLOGY

## 2023-09-23 ENCOUNTER — Ambulatory Visit
Admission: EM | Admit: 2023-09-23 | Discharge: 2023-09-23 | Disposition: A | Discharge status: Home / Self Care | Payer: Medicaid Other | Attending: Internal Medicine | Admitting: Internal Medicine

## 2023-09-23 ENCOUNTER — Other Ambulatory Visit: Payer: Self-pay

## 2023-09-23 ENCOUNTER — Encounter: Payer: Self-pay | Admitting: *Deleted

## 2023-09-23 DIAGNOSIS — R81 Glycosuria: Secondary | ICD-10-CM | POA: Diagnosis present

## 2023-09-23 DIAGNOSIS — N3001 Acute cystitis with hematuria: Secondary | ICD-10-CM | POA: Diagnosis present

## 2023-09-23 LAB — POCT URINALYSIS DIP (MANUAL ENTRY)
Bilirubin, UA: NEGATIVE
Glucose, UA: 500 mg/dL — AB
Nitrite, UA: NEGATIVE
Protein Ur, POC: 100 mg/dL — AB
Spec Grav, UA: 1.01 (ref 1.010–1.025)
Urobilinogen, UA: 0.2 E.U./dL
pH, UA: 6 (ref 5.0–8.0)

## 2023-09-23 LAB — POCT URINE PREGNANCY: Preg Test, Ur: NEGATIVE

## 2023-09-23 LAB — POCT FASTING CBG KUC MANUAL ENTRY: POCT Glucose (KUC): 445 mg/dL — AB (ref 70–99)

## 2023-09-23 NOTE — ED Notes (Signed)
Patient is being discharged from the Urgent Care and sent to the Emergency Department via pov . Per Ervin Knack, NP, patient is in need of higher level of care due to CBG 445. Patient is aware and verbalizes understanding of plan of care.  Vitals:   09/23/23 1003  BP: 125/82  Pulse: 97  Resp: 18  Temp: 98.4 F (36.9 C)  SpO2: 97%

## 2023-09-23 NOTE — ED Provider Notes (Signed)
[facility]    CSN: [id number] Arrival date & time: 09/23/23  0848      History   Chief Complaint Chief Complaint  Patient presents with   Urinary Frequency    HPI Linda Carlson is a 42 y.o. female.   Patient presents with urinary urgency, urinary frequency, dysuria that started about 4 to 5 days ago.  Denies vaginal bleeding, vaginal discharge, hematuria, abdominal pain, back pain, fever, chills.  Denies exposure to STD.  Last menstrual cycle was 08/31/2023.  States that she checked her blood sugar today and it was in the 200s. She does have a history of diabetes.    Urinary Frequency    Past Medical History:  Diagnosis Date   ANEMIA, HX OF 03/14/2011   Qualifier: Diagnosis of  By: Sharen Hones  MD, Javier     COVID-19 virus infection 03/2020   Diabetes mellitus without complication (HCC)    DIABETES MELLITUS, GESTATIONAL, HX OF 03/14/2011   Qualifier: Diagnosis of  By: Sharen Hones  MD, Wynona Canes     Fatty liver    History of diabetes mellitus    Seasonal allergic rhinitis     Patient Active Problem List   Diagnosis Date Noted   History of diabetes mellitus 03/04/2023   Sepsis (HCC) 10/29/2021   UTI (urinary tract infection) 10/28/2021   Diabetes mellitus without complication (HCC)    Hypokalemia    Adjustment disorder 01/31/2013   Diabetes mellitus type 2, uncontrolled, with complications 03/16/2011   TRANSAMINASES, SERUM, ELEVATED 03/16/2011    Past Surgical History:  Procedure Laterality Date   TUBAL LIGATION Bilateral     OB History     Gravida  2   Para  2   Term  2   Preterm      AB      Living  2      SAB      IAB      Ectopic      Multiple      Live Births  2            Home Medications    Prior to Admission medications   Medication Sig Start Date End Date Taking? Authorizing Provider  metFORMIN (GLUCOPHAGE) 500 MG tablet Take 1 tablet by mouth 2 (two) times daily.   Yes [provider]  acetaminophen  (TYLENOL) 500 MG tablet Take 500 mg by mouth every 6 (six) hours as needed for moderate pain.    [provider]  fluticasone (FLONASE) 50 MCG/ACT nasal spray Place 1 spray into both nostrils daily. Patient taking differently: Place 1 spray into both nostrils daily as needed for allergies. 12/15/22   Radford Pax, NP  ibuprofen (ADVIL) 200 MG tablet Take 200 mg by mouth every 6 (six) hours as needed for moderate pain.    [provider]  medroxyPROGESTERone (PROVERA) 10 MG tablet Take one tablet po qd, if bleeding doesn't slow down can increase to 1 tablet po BID. Continue for 10 days 04/16/23   Romualdo Bolk, MD  ondansetron (ZOFRAN-ODT) 4 MG disintegrating tablet Take 1 tablet (4 mg total) by mouth every 8 (eight) hours as needed for nausea or vomiting. 01/02/23   Petrucelli, Pleas Koch, PA-C    Family History Family History  Problem Relation Age of Onset   Ovarian cancer Mother    Diabetes Mother    Hypertension Mother    Diabetes Father    Heart attack Father    Breast cancer Neg Hx  Social History Social History   Tobacco Use   Smoking status: Never   Smokeless tobacco: Never  Vaping Use   Vaping status: Never Used  Substance Use Topics   Alcohol use: Yes    Comment: OCC   Drug use: No     Allergies   Patient has no known allergies.   Review of Systems Review of Systems Per HPI  Physical Exam Triage Vital Signs ED Triage Vitals  Encounter Vitals Group     BP 09/23/23 1003 125/82     Systolic BP Percentile --      Diastolic BP Percentile --      Pulse Rate 09/23/23 1003 97     Resp 09/23/23 1003 18     Temp 09/23/23 1003 98.4 F (36.9 C)     Temp Source 09/23/23 1003 Oral     SpO2 09/23/23 1003 97 %     Weight 09/23/23 1002 165 lb (74.8 kg)     Height 09/23/23 1002 5\' 4"  (1.626 m)     Head Circumference --      Peak Flow --      Pain Score 09/23/23 1002 0     Pain Loc --      Pain Education --      Exclude from Growth Chart  --    No data found.  Updated Vital Signs BP 125/82 (BP Location: Right Arm)   Pulse 97   Temp 98.4 F (36.9 C) (Oral)   Resp 18   Ht 5\' 4"  (1.626 m)   Wt 165 lb (74.8 kg)   LMP 08/31/2023 (Approximate)   SpO2 97%   BMI 28.32 kg/m   Visual Acuity Right Eye Distance:   Left Eye Distance:   Bilateral Distance:    Right Eye Near:   Left Eye Near:    Bilateral Near:     Physical Exam Constitutional:      General: She is not in acute distress.    Appearance: Normal appearance. She is not toxic-appearing or diaphoretic.  HENT:     Head: Normocephalic and atraumatic.  Eyes:     Extraocular Movements: Extraocular movements intact.     Conjunctiva/sclera: Conjunctivae normal.  Pulmonary:     Effort: Pulmonary effort is normal.  Neurological:     General: No focal deficit present.     Mental Status: She is alert and oriented to person, place, and time. Mental status is at baseline.  Psychiatric:        Mood and Affect: Mood normal.        Behavior: Behavior normal.        Thought Content: Thought content normal.        Judgment: Judgment normal.      UC Treatments / Results  Labs (all labs ordered are listed, but only abnormal results are displayed) Labs Reviewed  POCT URINALYSIS DIP (MANUAL ENTRY) - Abnormal; Notable for the following components:      Result Value   Clarity, UA cloudy (*)    Glucose, UA =500 (*)    Ketones, POC UA moderate (40) (*)    Blood, UA large (*)    Protein Ur, POC =100 (*)    Leukocytes, UA Trace (*)    All other components within normal limits  POCT FASTING CBG [facility] MANUAL ENTRY - Abnormal; Notable for the following components:   POCT Glucose ([facility]) 445 (*)    All other components within normal limits  URINE CULTURE  POCT URINE PREGNANCY  EKG   Radiology No results found.  Procedures Procedures (including critical care time)  Medications Ordered in UC Medications - No data to display  Initial Impression / Assessment  and Plan / UC Course  I have reviewed the triage vital signs and the nursing notes.  Pertinent labs & imaging results that were available during my care of the patient were reviewed by me and considered in my medical decision making (see chart for details).     UA only showing a trace amount of leukocytes but has a large amount of glucose and ketones.  Blood sugar was checked today and it was 445.  Therefore, I suspect the patient's symptoms could be attributed to glucose and do think that patient needs a more extensive and adequate evaluation at the emergency department to rule out DKA or complications related to diabetes.  She was agreeable to going to the emergency department.  Vital signs stable at discharge. Agree with patient self transport to the ER. Final Clinical Impressions(s) / UC Diagnoses   Final diagnoses:  Acute cystitis with hematuria  Glycosuria   Discharge Instructions   None    ED Prescriptions   None    PDMP not reviewed this encounter.   Gustavus Bryant, [place] 09/23/23 1040

## 2023-09-23 NOTE — ED Triage Notes (Signed)
Pt c/o urinary urgency, frequency and dysuria since Saturday

## 2023-09-24 LAB — URINE CULTURE: Culture: 40000 — AB

## 2023-09-25 ENCOUNTER — Telehealth: Payer: Self-pay

## 2023-09-25 LAB — URINE CULTURE

## 2023-09-25 MED ORDER — NITROFURANTOIN MONOHYD MACRO 100 MG PO CAPS: 100.0000 mg | ORAL_CAPSULE | Freq: Two times a day (BID) | ORAL | 0 refills | Status: AC

## 2023-09-25 NOTE — Telephone Encounter (Signed)
Per VO by L. Lequita Halt,  PA-C, "Macrobid 100 mg twice daily x 5 days." Attempted to reach patient x1. LVM.  Rx sent to pharmacy on file.

## 2024-02-27 ENCOUNTER — Other Ambulatory Visit: Payer: Self-pay | Admitting: Family Medicine

## 2024-02-27 DIAGNOSIS — Z0000 Encounter for general adult medical examination without abnormal findings: Secondary | ICD-10-CM

## 2024-03-03 ENCOUNTER — Other Ambulatory Visit (HOSPITAL_COMMUNITY)
Admission: RE | Admit: 2024-03-03 | Discharge: 2024-03-03 | Disposition: A | Discharge status: Home / Self Care | Source: Ambulatory Visit | Attending: Obstetrics and Gynecology | Admitting: Obstetrics and Gynecology

## 2024-03-03 ENCOUNTER — Ambulatory Visit (INDEPENDENT_AMBULATORY_CARE_PROVIDER_SITE_OTHER): Payer: Medicaid Other | Admitting: Obstetrics and Gynecology

## 2024-03-03 ENCOUNTER — Encounter: Payer: Self-pay | Admitting: Obstetrics and Gynecology

## 2024-03-03 VITALS — BP 118/74 | HR 94 | Ht 64.5 in | Wt 165.0 lb

## 2024-03-03 DIAGNOSIS — Z8742 Personal history of other diseases of the female genital tract: Secondary | ICD-10-CM | POA: Diagnosis present

## 2024-03-03 DIAGNOSIS — Z01419 Encounter for gynecological examination (general) (routine) without abnormal findings: Secondary | ICD-10-CM | POA: Insufficient documentation

## 2024-03-03 DIAGNOSIS — N946 Dysmenorrhea, unspecified: Secondary | ICD-10-CM

## 2024-03-03 DIAGNOSIS — Z9851 Tubal ligation status: Secondary | ICD-10-CM

## 2024-03-03 DIAGNOSIS — Z1211 Encounter for screening for malignant neoplasm of colon: Secondary | ICD-10-CM

## 2024-03-03 DIAGNOSIS — Z98891 History of uterine scar from previous surgery: Secondary | ICD-10-CM

## 2024-03-03 DIAGNOSIS — N8003 Adenomyosis of the uterus: Secondary | ICD-10-CM

## 2024-03-03 DIAGNOSIS — R300 Dysuria: Secondary | ICD-10-CM

## 2024-03-03 DIAGNOSIS — D500 Iron deficiency anemia secondary to blood loss (chronic): Secondary | ICD-10-CM

## 2024-03-03 DIAGNOSIS — N938 Other specified abnormal uterine and vaginal bleeding: Secondary | ICD-10-CM

## 2024-03-03 DIAGNOSIS — N921 Excessive and frequent menstruation with irregular cycle: Secondary | ICD-10-CM

## 2024-03-03 LAB — URINALYSIS
Bilirubin Urine: NEGATIVE
Nitrite: NEGATIVE
Specific Gravity, Urine: 1.02 (ref 1.001–1.035)
pH: 5.5 (ref 5.0–8.0)

## 2024-03-03 MED ORDER — FLUCONAZOLE 150 MG PO TABS: 150.0000 mg | ORAL_TABLET | Freq: Every day | ORAL | 3 refills | Status: AC

## 2024-03-03 NOTE — Progress Notes (Addendum)
 43 y.o. y.o. female here for annual exam. Patient with debilitating periods that are painful and heavy. Has been to the ER for the pain. H/o LTCS x2 Has A2DM reports improved control Patient cannot get out of bed with her cycle due to the pain as she is bent over most of the day.  She is using a pad and tampon Q hour for 5-7 days then bleeds in between periods.  She has large clots as well. Was given provera at last visit, but was not sure about taking it and declines any hormones or ocp's with her diabetes. The pain feels like a shooting pain and similar to a labor contraction. She is does not desire more children in the future. She is divorced and dating at the moment. Patient also with a history of abnormal pap smears. She went through [facility] and [place] in April.  She does not want to go through multiple procedures or try multiple birth controls and desires a definitive management with the [facility].  No LMP recorded. (Menstrual status: Irregular Periods).   Per last visit note: G2P2002 with Patient's last menstrual period was 03/01/2023.   here for colposcopy.  Recent pap returned with ASCUS/+HPV  She has been having irregular bleeding since 12/23. Since her last visit, she has been having light bleeding off and on, no real cycle.   The patient was seen in the ER on 01/01/23 with AUB, abdominal pain, N/V.  She had negative cervical cultures, a negative UPT, normal CBC, normal CMP other than a glucose of 345. U/S on 01/01/23 she had a 3.4 cm simple right ovarian cyst. No f/u was recommended.   She is continuing to have sharp pain in the RLQ, it has been constant cramping or sharp pain for a couple of months. Aleve helps some.   01/21/23 normal TSH. There is no height or weight on file to calculate BMI.   04/16/23 Pelvic ultrasound   Indications: AUB, pain   Findings:   Uterus 8.15 x 5.04 x 3.57 cm, anteverted   Endometrium 13.06 mm, no masses seen, avascular   Left ovary 2.65 x 1.25 x  1.56 cm 1.47 x 0.82 cm resolving simple cyst   Right ovary 2.54 x 1.50 x 1.73 cm   No free fluid   Impression:  Normal sized anteverted uterus Thickened avascular endometrium, no masses seen Bilaterally normal ovaries, resolving cyst left ovary No adnexal masses  04/16/23  3 Result Notes    Component Ref Range & Units (hover) 10 mo ago  SURGICAL PATHOLOGY SURGICAL PATHOLOGY CASE: [id number] PATIENT: Alvan Dame Surgical Pathology Report     Clinical History: AUB (cm)     FINAL MICROSCOPIC DIAGNOSIS:  A. ENDOMETRIUM, BIOPSY:      Benign proliferative endometrium.      Negative for hyperplasia or malignancy.   GROSS DESCRIPTION:  Received in formalin is a 2.1 x 2 x 0.4 cm aggregate of pink-red soft tissue, entirely submitted in 2 blocks.  SW 04/17/2023           No data to display          There were no vitals taken for this visit.     Component Value Date/Time   DIAGPAP (A) 01/21/2023 1541    - Atypical squamous cells of undetermined significance (ASC-US)   HPVHIGH Positive (A) 01/21/2023 1541   ADEQPAP  01/21/2023 1541    Satisfactory but limited for evaluation with partially obscuring   ADEQPAP organisms; transformation zone component  present. 01/21/2023 1541    GYN HISTORY:    Component Value Date/Time   DIAGPAP (A) 01/21/2023 1541    - Atypical squamous cells of undetermined significance (ASC-US)   HPVHIGH Positive (A) 01/21/2023 1541   ADEQPAP  01/21/2023 1541    Satisfactory but limited for evaluation with partially obscuring   ADEQPAP organisms; transformation zone component present. 01/21/2023 1541    OB History  Gravida Para Term Preterm AB Living  2 2 2   2   SAB IAB Ectopic Multiple Live Births      2    # Outcome Date GA Lbr Len/2nd Weight Sex Type Anes PTL Lv  2 Term     F CS-LTranv   LIV  1 Term     F CS-LTranv   LIV    Past Medical History:  Diagnosis Date   ANEMIA, HX OF 03/14/2011   Qualifier: Diagnosis of   By: Sharen Hones  MD, Javier     COVID-19 virus infection 03/2020   Diabetes mellitus without complication (HCC)    DIABETES MELLITUS, GESTATIONAL, HX OF 03/14/2011   Qualifier: Diagnosis of  By: Sharen Hones  MD, Wynona Canes     Fatty liver    History of diabetes mellitus    Seasonal allergic rhinitis     Past Surgical History:  Procedure Laterality Date   TUBAL LIGATION Bilateral     Current Outpatient Medications on File Prior to Visit  Medication Sig Dispense Refill   acetaminophen (TYLENOL) 500 MG tablet Take 500 mg by mouth every 6 (six) hours as needed for moderate pain.     fluticasone (FLONASE) 50 MCG/ACT nasal spray Place 1 spray into both nostrils daily. (Patient taking differently: Place 1 spray into both nostrils daily as needed for allergies.) 15.8 mL 0   ibuprofen (ADVIL) 200 MG tablet Take 200 mg by mouth every 6 (six) hours as needed for moderate pain.     medroxyPROGESTERone (PROVERA) 10 MG tablet Take one tablet po qd, if bleeding doesn't slow down can increase to 1 tablet po BID. Continue for 10 days 20 tablet 0   metFORMIN (GLUCOPHAGE) 500 MG tablet Take 1 tablet by mouth 2 (two) times daily.     ondansetron (ZOFRAN-ODT) 4 MG disintegrating tablet Take 1 tablet (4 mg total) by mouth every 8 (eight) hours as needed for nausea or vomiting. 8 tablet 0   No current facility-administered medications on file prior to visit.    Social History   Socioeconomic History   Marital status: Divorced    Spouse name: Not on file   Number of children: Not on file   Years of education: Not on file   Highest education level: Not on file  Occupational History   Not on file  Tobacco Use   Smoking status: Never   Smokeless tobacco: Never  Vaping Use   Vaping status: Never Used  Substance and Sexual Activity   Alcohol use: Yes    Comment: OCC   Drug use: No   Sexual activity: Not Currently    Birth control/protection: Abstinence  Other Topics Concern   Not on file  Social  History Narrative   Not on file   Social Drivers of Health   Financial Resource Strain: Not on file  Food Insecurity: Not on file  Transportation Needs: Not on file  Physical Activity: Not on file  Stress: Not on file  Social Connections: Not on file  Intimate Partner Violence: Not on file    Family History  Problem Relation Age of Onset   Ovarian cancer Mother    Diabetes Mother    Hypertension Mother    Diabetes Father    Heart attack Father    Breast cancer Neg Hx      No Known Allergies    Patient's last menstrual period was No LMP recorded. (Menstrual status: Irregular Periods)..            Review of Systems Alls systems reviewed and are negative.     Physical Exam Constitutional:      Appearance: Normal appearance.  Genitourinary:     Vulva and urethral meatus normal.     No lesions in the vagina.     Genitourinary Comments: Excoriations on b/l labia and right inner thigh c/w yeast infection  Uterus with boggy contour and tenderness to deep palpation c/w adenomyosis     Right Labia: No rash, lesions or skin changes.    Left Labia: No lesions, skin changes or rash.    No vaginal discharge or tenderness.     No vaginal prolapse present.    No vaginal atrophy present.     Right Adnexa: not tender, not palpable and no mass present.    Left Adnexa: not tender, not palpable and no mass present.    No cervical motion tenderness or discharge.     Uterus is enlarged and tender.     Uterus is not irregular.  Breasts:    Right: Normal.     Left: Normal.  HENT:     Head: Normocephalic.  Neck:     Thyroid: No thyroid mass, thyromegaly or thyroid tenderness.  Cardiovascular:     Rate and Rhythm: Normal rate and regular rhythm.     Heart sounds: Normal heart sounds, S1 normal and S2 normal.  Pulmonary:     Effort: Pulmonary effort is normal.     Breath sounds: Normal breath sounds and air entry.  Abdominal:     General: There is no distension.      Palpations: Abdomen is soft. There is no mass.     Tenderness: There is no abdominal tenderness. There is no guarding or rebound.  Musculoskeletal:        General: Normal range of motion.     Cervical back: Full passive range of motion without pain, normal range of motion and neck supple. No tenderness.     Right lower leg: No edema.     Left lower leg: No edema.  Neurological:     Mental Status: She is alert.  Skin:    General: Skin is warm.  Psychiatric:        Mood and Affect: Mood normal.        Behavior: Behavior normal.        Thought Content: Thought content normal.  Vitals and nursing note reviewed. Exam conducted with a chaperone present.       A:         Well Woman GYN exam, adenomyosis, DUB, menorrhagia, dysmenorrhea, A2DM, desires definitive management, h/o LTCSx 2, severe debilitating pain affecting her quality of life                             P:        Pap smear collected today Encouraged annual mammogram screening Colon cancer screening referral placed today DXA not indicated Labs and immunizations ordered today Likely adenomyosis:  Counseled on all options.  She would like to  have the [facility].  Counseled extensively on the procedure including but not limited to what to expect and risks and benefits.  Counseled on postop care and pelvic rest for 10 weeks after the surgery with restricted lifting for 6 weeks after.  Counseled on the benefits of the robotic procedure with faster return to daily activities, improved outcomes, and less risk for complications. She would like to have this scheduled.  Encouraged healthy lifestyle practices Encouraged Vit D and Calcium   No follow-ups on file.  Earley Favor

## 2024-03-04 ENCOUNTER — Encounter: Payer: Self-pay | Admitting: Obstetrics and Gynecology

## 2024-03-04 LAB — VITAMIN D 25 HYDROXY (VIT D DEFICIENCY, FRACTURES): Vit D, 25-Hydroxy: 21 ng/mL — ABNORMAL LOW (ref 30–100)

## 2024-03-04 LAB — COMPREHENSIVE METABOLIC PANEL
AG Ratio: 1.4 (calc) (ref 1.0–2.5)
ALT: 29 U/L (ref 6–29)
AST: 13 U/L (ref 10–30)
Albumin: 4.3 g/dL (ref 3.6–5.1)
Alkaline phosphatase (APISO): 40 U/L (ref 31–125)
BUN: 12 mg/dL (ref 7–25)
CO2: 21 mmol/L (ref 20–32)
Calcium: 9.6 mg/dL (ref 8.6–10.2)
Chloride: 101 mmol/L (ref 98–110)
Creat: 0.61 mg/dL (ref 0.50–0.99)
Globulin: 3 g/dL (ref 1.9–3.7)
Glucose, Bld: 308 mg/dL — ABNORMAL HIGH (ref 65–99)
Potassium: 3.8 mmol/L (ref 3.5–5.3)
Sodium: 135 mmol/L (ref 135–146)
Total Bilirubin: 0.4 mg/dL (ref 0.2–1.2)
Total Protein: 7.3 g/dL (ref 6.1–8.1)

## 2024-03-04 LAB — CBC
HCT: 41.9 % (ref 35.0–45.0)
Hemoglobin: 13.3 g/dL (ref 11.7–15.5)
MCH: 29.4 pg (ref 27.0–33.0)
MCHC: 31.7 g/dL — ABNORMAL LOW (ref 32.0–36.0)
MCV: 92.5 fL (ref 80.0–100.0)
MPV: 11.3 fL (ref 7.5–12.5)
Platelets: 331 10*3/uL (ref 140–400)
RBC: 4.53 10*6/uL (ref 3.80–5.10)
RDW: 11.8 % (ref 11.0–15.0)
WBC: 6.1 10*3/uL (ref 3.8–10.8)

## 2024-03-04 LAB — HEMOGLOBIN A1C
Hgb A1c MFr Bld: 12.8 %{Hb} — ABNORMAL HIGH (ref <5.7)
Mean Plasma Glucose: 321 mg/dL
eAG (mmol/L): 17.8 mmol/L

## 2024-03-04 LAB — IRON,TIBC AND FERRITIN PANEL
%SAT: 11 % — ABNORMAL LOW (ref 16–45)
Ferritin: 10 ng/mL — ABNORMAL LOW (ref 16–232)
Iron: 47 ug/dL (ref 40–190)
TIBC: 429 ug/dL (ref 250–450)

## 2024-03-04 LAB — SURESWAB® ADVANCED VAGINITIS PLUS,TMA
C. trachomatis RNA, TMA: NOT DETECTED
CANDIDA SPECIES: NOT DETECTED
Candida glabrata: DETECTED — AB
N. gonorrhoeae RNA, TMA: NOT DETECTED
SURESWAB(R) ADV BACTERIAL VAGINOSIS(BV),TMA: POSITIVE — AB
TRICHOMONAS VAGINALIS (TV),TMA: NOT DETECTED

## 2024-03-05 LAB — URINE CULTURE
MICRO NUMBER:: 16167908
SPECIMEN QUALITY:: ADEQUATE

## 2024-03-07 ENCOUNTER — Other Ambulatory Visit: Payer: Self-pay | Admitting: *Deleted

## 2024-03-07 MED ORDER — FLUCONAZOLE 150 MG PO TABS: 150.0000 mg | ORAL_TABLET | Freq: Once | ORAL | 0 refills | Status: AC

## 2024-03-07 MED ORDER — NITROFURANTOIN MONOHYD MACRO 100 MG PO CAPS: 100.0000 mg | ORAL_CAPSULE | Freq: Two times a day (BID) | ORAL | 0 refills | Status: DC

## 2024-03-09 ENCOUNTER — Encounter: Payer: Self-pay | Admitting: Obstetrics and Gynecology

## 2024-03-09 ENCOUNTER — Ambulatory Visit (INDEPENDENT_AMBULATORY_CARE_PROVIDER_SITE_OTHER): Admitting: Obstetrics and Gynecology

## 2024-03-09 VITALS — BP 128/88 | HR 94

## 2024-03-09 DIAGNOSIS — Z8742 Personal history of other diseases of the female genital tract: Secondary | ICD-10-CM

## 2024-03-09 DIAGNOSIS — N938 Other specified abnormal uterine and vaginal bleeding: Secondary | ICD-10-CM | POA: Diagnosis not present

## 2024-03-09 DIAGNOSIS — N921 Excessive and frequent menstruation with irregular cycle: Secondary | ICD-10-CM

## 2024-03-09 NOTE — Progress Notes (Signed)
 Patient presents for medicaid consent  She has had the tubal ligation and does not desire more children in the future and understands the hysterectomy is a permanent procedure. All questions were answered and both forms were signed and reviewed with her. Dr. Karma Greaser

## 2024-03-10 NOTE — Telephone Encounter (Signed)
 Left message to call [facility] Triage at [phone], option 4.

## 2024-03-10 NOTE — Telephone Encounter (Signed)
 Dr. Karma Greaser, please review and advise. Macrobid prescribed on 03/07/24 for UTI. Patient unable to tolerate.

## 2024-03-10 NOTE — Telephone Encounter (Signed)
 Dr. Karma Greaser -patient is scheduled for nurse visit tomorrow at 1045. Please advise on Rocephin order.

## 2024-03-11 ENCOUNTER — Ambulatory Visit

## 2024-03-11 DIAGNOSIS — N3000 Acute cystitis without hematuria: Secondary | ICD-10-CM

## 2024-03-11 LAB — CYTOLOGY - PAP
Comment: NEGATIVE
Diagnosis: UNDETERMINED — AB
High risk HPV: NEGATIVE

## 2024-03-11 MED ORDER — CEFTRIAXONE SODIUM 1 G IJ SOLR: 1.0000 g | Freq: Once | INTRAMUSCULAR | Status: DC

## 2024-03-29 ENCOUNTER — Ambulatory Visit
Admission: RE | Admit: 2024-03-29 | Discharge: 2024-03-29 | Disposition: A | Discharge status: Home / Self Care | Source: Ambulatory Visit | Attending: Family Medicine | Admitting: Family Medicine

## 2024-03-29 DIAGNOSIS — Z0000 Encounter for general adult medical examination without abnormal findings: Secondary | ICD-10-CM

## 2024-04-06 ENCOUNTER — Encounter: Payer: Self-pay | Admitting: Obstetrics and Gynecology

## 2024-04-06 ENCOUNTER — Ambulatory Visit: Admitting: Obstetrics and Gynecology

## 2024-04-06 VITALS — BP 116/70 | HR 94 | Ht 64.25 in | Wt 161.0 lb

## 2024-04-06 DIAGNOSIS — N946 Dysmenorrhea, unspecified: Secondary | ICD-10-CM | POA: Diagnosis not present

## 2024-04-06 DIAGNOSIS — D500 Iron deficiency anemia secondary to blood loss (chronic): Secondary | ICD-10-CM

## 2024-04-06 DIAGNOSIS — N921 Excessive and frequent menstruation with irregular cycle: Secondary | ICD-10-CM

## 2024-04-06 DIAGNOSIS — Z01818 Encounter for other preprocedural examination: Secondary | ICD-10-CM | POA: Diagnosis not present

## 2024-04-06 DIAGNOSIS — N8003 Adenomyosis of the uterus: Secondary | ICD-10-CM

## 2024-04-06 MED ORDER — METOCLOPRAMIDE HCL 10 MG PO TABS: 10.0000 mg | ORAL_TABLET | Freq: Three times a day (TID) | ORAL | 0 refills | Status: DC | PRN

## 2024-04-06 MED ORDER — IBUPROFEN 800 MG PO TABS: 800.0000 mg | ORAL_TABLET | Freq: Three times a day (TID) | ORAL | 1 refills | Status: AC | PRN

## 2024-04-06 MED ORDER — OXYCODONE HCL 5 MG PO TABS: 5.0000 mg | ORAL_TABLET | ORAL | 0 refills | Status: DC | PRN

## 2024-04-06 NOTE — Progress Notes (Signed)
 PREOP H&P for [facility], bilateral salpingectomy, cystoscopy  43 y.o. y.o. female here for annual exam. Patient with debilitating periods that are painful and heavy. Has been to the ER for the pain. H/o LTCS x2 Has A2DM reports improved control Patient cannot get out of bed with her cycle due to the pain as she is bent over most of the day.  She is using a pad and tampon Q hour for 5-7 days then bleeds in between periods.  She has large clots as well. Was given provera at last visit, but was not sure about taking it and declines any hormones or ocp's with her diabetes. The pain feels like a shooting pain and similar to a labor contraction. She is does not desire more children in the future. She is divorced and dating at the moment. Patient also with a history of abnormal pap smears. She went through EMB and [place] in April.  She does not want to go through multiple procedures or try multiple birth controls and desires a definitive management with the [facility].  Patient's last menstrual period was 03/09/2024 (exact date).   Per last visit note: G2P2002 with Patient's last menstrual period was 03/01/2023.   here for colposcopy.  Recent pap returned with ASCUS/+HPV  She has been having irregular bleeding since 12/23. Since her last visit, she has been having light bleeding off and on, no real cycle.   The patient was seen in the ER on 01/01/23 with AUB, abdominal pain, N/V.  She had negative cervical cultures, a negative UPT, normal CBC, normal CMP other than a glucose of 345. U/S on 01/01/23 she had a 3.4 cm simple right ovarian cyst. No f/u was recommended.   She is continuing to have sharp pain in the RLQ, it has been constant cramping or sharp pain for a couple of months. Aleve helps some.   01/21/23 normal TSH. Body mass index is 27.42 kg/m.   04/16/23 Pelvic ultrasound   Indications: AUB, pain   Findings:   Uterus 8.15 x 5.04 x 3.57 cm, anteverted   Endometrium 13.06 mm, no masses seen,  avascular   Left ovary 2.65 x 1.25 x 1.56 cm 1.47 x 0.82 cm resolving simple cyst   Right ovary 2.54 x 1.50 x 1.73 cm   No free fluid   Impression:  Normal sized anteverted uterus Thickened avascular endometrium, no masses seen Bilaterally normal ovaries, resolving cyst left ovary No adnexal masses  04/16/23  3 Result Notes    Component Ref Range & Units (hover) 10 mo ago  SURGICAL PATHOLOGY SURGICAL PATHOLOGY CASE: [id number] PATIENT: Linda Carlson Surgical Pathology Report     Clinical History: AUB (cm)     FINAL MICROSCOPIC DIAGNOSIS:  A. ENDOMETRIUM, BIOPSY:      Benign proliferative endometrium.      Negative for hyperplasia or malignancy.   GROSS DESCRIPTION:  Received in formalin is a 2.1 x 2 x 0.4 cm aggregate of pink-red soft tissue, entirely submitted in 2 blocks.  SW 04/17/2023           No data to display          Height 5' 4.25" (1.632 m), weight 161 lb (73 kg), last menstrual period 03/09/2024.     Component Value Date/Time   DIAGPAP (A) 03/03/2024 0900    - Atypical squamous cells of undetermined significance (ASC-US)   DIAGPAP (A) 01/21/2023 1541    - Atypical squamous cells of undetermined significance (ASC-US)   HPVHIGH Negative 03/03/2024  0900   HPVHIGH Positive (A) 01/21/2023 1541   ADEQPAP  03/03/2024 0900    Satisfactory for evaluation; transformation zone component PRESENT.   ADEQPAP  01/21/2023 1541    Satisfactory but limited for evaluation with partially obscuring   ADEQPAP organisms; transformation zone component present. 01/21/2023 1541    GYN HISTORY:    Component Value Date/Time   DIAGPAP (A) 03/03/2024 0900    - Atypical squamous cells of undetermined significance (ASC-US)   DIAGPAP (A) 01/21/2023 1541    - Atypical squamous cells of undetermined significance (ASC-US)   HPVHIGH Negative 03/03/2024 0900   HPVHIGH Positive (A) 01/21/2023 1541   ADEQPAP  03/03/2024 0900    Satisfactory for evaluation;  transformation zone component PRESENT.   ADEQPAP  01/21/2023 1541    Satisfactory but limited for evaluation with partially obscuring   ADEQPAP organisms; transformation zone component present. 01/21/2023 1541    OB History  Gravida Para Term Preterm AB Living  2 2 2   2   SAB IAB Ectopic Multiple Live Births      2    # Outcome Date GA Lbr Len/2nd Weight Sex Type Anes PTL Lv  2 Term     F CS-LTranv   LIV  1 Term     F CS-LTranv   LIV    Past Medical History:  Diagnosis Date   ANEMIA, HX OF 03/14/2011   Qualifier: Diagnosis of  By: Sharen Hones  MD, Javier     COVID-19 virus infection 03/2020   Diabetes mellitus without complication (HCC)    DIABETES MELLITUS, GESTATIONAL, HX OF 03/14/2011   Qualifier: Diagnosis of  By: Sharen Hones  MD, Wynona Canes     Fatty liver    History of diabetes mellitus    Seasonal allergic rhinitis     Past Surgical History:  Procedure Laterality Date   TUBAL LIGATION Bilateral     Current Outpatient Medications on File Prior to Visit  Medication Sig Dispense Refill   acetaminophen (TYLENOL) 500 MG tablet Take 500 mg by mouth every 6 (six) hours as needed for moderate pain.     fluticasone (FLONASE) 50 MCG/ACT nasal spray Place 1 spray into both nostrils daily. (Patient taking differently: Place 1 spray into both nostrils daily as needed for allergies.) 15.8 mL 0   ibuprofen (ADVIL) 200 MG tablet Take 200 mg by mouth every 6 (six) hours as needed for moderate pain.     metFORMIN (GLUCOPHAGE) 500 MG tablet Take 1 tablet by mouth 2 (two) times daily.     No current facility-administered medications on file prior to visit.    Social History   Socioeconomic History   Marital status: Divorced    Spouse name: Not on file   Number of children: Not on file   Years of education: Not on file   Highest education level: Not on file  Occupational History   Not on file  Tobacco Use   Smoking status: Never   Smokeless tobacco: Never  Vaping Use   Vaping  status: Never Used  Substance and Sexual Activity   Alcohol use: Yes    Comment: OCC   Drug use: No   Sexual activity: Yes    Partners: Male    Birth control/protection: None  Other Topics Concern   Not on file  Social History Narrative   Not on file   Social Drivers of Health   Financial Resource Strain: Not on file  Food Insecurity: Not on file  Transportation Needs: Not on  file  Physical Activity: Not on file  Stress: Not on file  Social Connections: Not on file  Intimate Partner Violence: Not on file    Family History  Problem Relation Age of Onset   Ovarian cancer Mother    Diabetes Mother    Hypertension Mother    Diabetes Father    Heart attack Father    Breast cancer Neg Hx      No Known Allergies    Patient's last menstrual period was Patient's last menstrual period was 03/09/2024 (exact date)..            Review of Systems Alls systems reviewed and are negative.   From last visit   Physical Exam Constitutional:      Appearance: Normal appearance.  Genitourinary:     Vulva and urethral meatus normal.     No lesions in the vagina.     Genitourinary Comments: Excoriations on b/l labia and right inner thigh c/w yeast infection  Uterus with boggy contour and tenderness to deep palpation c/w adenomyosis     Right Labia: No rash, lesions or skin changes.    Left Labia: No lesions, skin changes or rash.    No vaginal discharge or tenderness.     No vaginal prolapse present.    No vaginal atrophy present.     Right Adnexa: not tender, not palpable and no mass present.    Left Adnexa: not tender, not palpable and no mass present.    No cervical motion tenderness or discharge.     Uterus is enlarged and tender.     Uterus is not irregular.  Breasts:    Right: Normal.     Left: Normal.  HENT:     Head: Normocephalic.  Neck:     Thyroid: No thyroid mass, thyromegaly or thyroid tenderness.  Cardiovascular:     Rate and Rhythm: Normal rate and  regular rhythm.     Heart sounds: Normal heart sounds, S1 normal and S2 normal.  Pulmonary:     Effort: Pulmonary effort is normal.     Breath sounds: Normal breath sounds and air entry.  Abdominal:     General: There is no distension.     Palpations: Abdomen is soft. There is no mass.     Tenderness: There is no abdominal tenderness. There is no guarding or rebound.  Musculoskeletal:        General: Normal range of motion.     Cervical back: Full passive range of motion without pain, normal range of motion and neck supple. No tenderness.     Right lower leg: No edema.     Left lower leg: No edema.  Neurological:     Mental Status: She is alert.  Skin:    General: Skin is warm.  Psychiatric:        Mood and Affect: Mood normal.        Behavior: Behavior normal.        Thought Content: Thought content normal.  Vitals and nursing note reviewed. Exam conducted with a chaperone present.       A/P:         Preop exam for RLH, bilateral salpingectomy, cystoscopy for adenomyosis, DUB, menorrhagia, dysmenorrhea, A2DM, desires definitive management, h/o LTCSx 2, severe debilitating pain affecting her quality of life                             The risk, benefits,  alternatives of the procedure were discussed with patient including but not limited to the risk for bleeding, infection, injury to surrounding structures such as the bowel, vessels, bladder, and ureters were reviewed.  The risk for blood products and risk for an additional procedure, and risk for laparotomy in an extreme event was discussed.  The risk for blood clots are also discussed.  Postoperative care instructions were discussed and reviewed with patient.  Postoperative medications were sent to her pharmacy and patient was instructed to pick up prior to surgery.  She will have a driver to take her there and take her home and she agrees that that person will stay with her overnight. Post care instructions were also put in the  wrap-up section of this note for patient, as a reminder of care instructions. All questions were answered and patient agrees and would like to proceed with the procedure.      No follow-ups on file.  Earley Favor

## 2024-04-06 NOTE — Patient Instructions (Signed)
 Robotic Laparoscopic Hysterectomy, Care After  The following information offers guidance on how to care for yourself after your procedure. Your health care provider may also give you more specific instructions. If you have problems or questions, contact your health care provider. What can I expect after the procedure? After the procedure, it is common to have: Pain, bruising, and numbness around your incisions. Tiredness (fatigue). Poor appetite. Less interest in sex. Vaginal discharge or bleeding. You will need to use a sanitary pad after this procedure.  HEAVY BLEEDING LIKE A PERIOD IS NOT NORMAL.  PLEASE CALL YOUR PROVIDER IF SOAKING A PAD. Feelings of sadness or other emotions. If your ovaries were also removed, it is also common to have symptoms of menopause, such as hot flashes, night sweats, and lack of sleep (insomnia).  Ovaries should stay in if at all possible until at least the age of 87. Follow these instructions at home: Medicines Take over-the-counter and prescription medicines only as told by your health care provider. Ask your health care provider if the medicine prescribed to you: Requires you to avoid driving or using machinery. Can cause constipation. You may need to take these actions to prevent or treat constipation: Drink enough fluid to keep your urine pale yellow. Take over-the-counter or prescription medicines. Eat foods that are high in fiber, such as beans, whole grains, and fresh fruits and vegetables. Limit foods that are high in fat and processed sugars, such as fried or sweet foods.  Also, avoid spicy foods.  NAUSEA IS COMMON THE FIRST NIGHT OF SURGERY.  IF IT LASTS BEYOND 24 HOURS, CALL YOUR PROVIDER.  NAUSEA MEDICATION WAS GIVEN AT YOUR PREOP APPOINTMENT THAT YOU CAN TAKE AFTER SURGERY. Incision care  Follow instructions from your health care provider about how to take care of your incisions. Make sure you: LEAVE INCISION OPEN AND DRY-NO BANDAGES Leave  stitches (sutures), skin glue, or adhesive strips in place UNTIL 2 WEEKS THEN REMOVE IN THE SHOWER.  If adhesive strip edges start to loosen and curl up, you may trim the loose edges. Check your incision areas every day for signs of infection. Check for: More redness, swelling, or pain. Fluid or blood. Warmth. Pus or a bad smell. Activity  Rest as told by your health care provider. Avoid sitting for a long time without moving. Get up to take short walks every 1-2 hours. This is important to improve blood flow and breathing. Ask for help if you feel weak or unsteady. Return to your normal activities as told by your health care provider. Ask your health care provider what activities are safe for you. Do not lift anything that is heavier than 10 lb (4.5 kg), or the limit that you are told, for 8 WEEKS after surgery or until your health care provider says that it is safe. If you were given a sedative during the procedure, it can affect you for several hours. Do not drive or operate machinery until your health care provider says that it is safe. Lifestyle Do not use any products that contain nicotine or tobacco. These products include cigarettes, chewing tobacco, and vaping devices, such as e-cigarettes. These can delay healing after surgery. If you need help quitting, ask your health care provider. Do not drink alcohol until your health care provider approves. General instructions FOR 2 WEEKS AFTER SURGERY, THEN YOU MAY USE TUBS AND HOT TUBS Do not douche, use tampons, or have sex for at least 10 weeks, or as told by your health care  provider. If you struggle with physical or emotional changes after your procedure, speak with your health care provider or a therapist. Do not take baths, swim, or use a hot tub until your health care provider approves. You may only be allowed to take showers for 2 weeks. IF YOU HAVE BURNING WITH URINATION, PLEASE CALL YOUR DOCTOR. BLADDER INFECTIONS MAY OCCUR AFTER  SURGERY Try to have someone at home with you for the first 1-2 weeks to help with your daily chores. Wear compression stockings as told by your health care provider. These stockings help to prevent blood clots and reduce swelling in your legs. Keep all follow-up visits. This is important. Contact a health care provider if: You have any of these signs of infection: Chills or a fever 12f OR GREATER. More redness, swelling, or pain around an incision. Fluid or blood coming from an incision. Warmth coming from an incision. Pus or a bad smell coming from an incision. Burning with urination. Urinary frequency or cramping.   IF YOU HAVE THESE SYMPTOMS, PLEASE CALL THE OFFICE TO COME EVALUATE FOR A BLADDER INFECTION AT [phone] An incision opens. You feel dizzy or light-headed. You have pain or bleeding when you urinate, or you are unable to urinate. You have abnormal vaginal discharge. You have pain that does not get better with medicine. Get help right away if: You have a fever and your symptoms suddenly get worse. You have severe abdominal pain. You have chest pain or shortness of breath. You may have chest pain and shortness of breath from the CO2 gas for a few days after surgery.  This is very common.  Walking, Gas-X and motrin will usually help relieve this discomfort You faint. You have pain, swelling, or redness in your leg. You have heavy vaginal bleeding with blood clots, soaking through a sanitary pad in less than 1 hour. These symptoms may represent a serious problem that is an emergency. Do not wait to see if the symptoms will go away. Get medical help right away. Call your local emergency services (911 in the [place] Do not drive yourself to the hospital. Summary  CONSTIPATION MEDICATION AFTER SURGERY: COLACE, MOM, MIRALAX, GAS X are all helpful to have on hand, if needed.  FILL ALL POSTOP MEDICATION BEFORE SURGERY  After the procedure, it is common to have pain and bruising  around your incisions. Do not take baths, swim, or use a hot tub until your health care provider approves. Do not lift anything that is heavier than 8- 10 lb (4.5 kg), or the limit that you are told, for one month after surgery or until your health care provider says that it is safe. Tell your health care provider if you have any signs or symptoms of infection after the procedure. Get help right away if you have severe abdominal pain, chest pain, shortness of breath, or heavy bleeding from your vagina. This information is not intended to replace advice given to you by your  health care provider. Make sure you discuss any questions you have with your health care provider. Document Revised: 08/16/2020 Document Reviewed: 08/17/2020 Elsevier Patient Education  2024 [employer].

## 2024-04-11 ENCOUNTER — Encounter: Payer: Self-pay | Admitting: *Deleted

## 2024-04-22 ENCOUNTER — Encounter (HOSPITAL_COMMUNITY): Payer: Self-pay | Admitting: Obstetrics and Gynecology

## 2024-04-22 NOTE — Pre-Procedure Instructions (Signed)
 Surgical Instructions   Your procedure is scheduled on :  Friday,  04-29-2024. Report to [facility] Main Entrance "A" at 11:00 A.M., then check in with the Admitting office. Any questions or running late day of surgery: call [phone]  Questions prior to your surgery date: call [phone], Monday-Friday, 8am-4pm. If you experience any cold or flu symptoms such as cough, fever, chills, shortness of breath, etc. between now and your scheduled surgery, please notify your surgeon office.    Remember:  Do not eat any food after midnight the night before your surgery.  You may have clear liquids from midnight night before surgery until 10:00 AM.   You may drink clear liquids until 10:00 AM the morning of your surgery.   Clear liquids allowed are:  Water Carbonated Beverages (only diet or NO sugar options Clear Tea (may use non-sweetener, no milk, honey, etc.) Black Coffee Only (may use non-sweetener, NO MILK, CREAM OR POWDERED CREAMER of any kind) Sport drink, like Gatorade  (only diet or NO sugar options)  NO clear liquids after 10:00 AM day of surgery.  This includes no water,  candy,  gum,  and  mints.    Take these medicines the morning of surgery with A SIP OF WATER :   May take these medicines IF NEEDED: sips of water Fluticasone  (Flonase ) nasal spray   WHAT DO I DO ABOUT MY DIABETES MEDICATION?  Do not take oral diabetes medicines (pills) the morning of surgery.  HOW TO MANAGE YOUR DIABETES BEFORE AND AFTER SURGERY  Why is it important to control my blood sugar before and after surgery? Improving blood sugar levels before and after surgery helps healing and can limit problems. A way of improving blood sugar control is eating a healthy diet by:  Eating less sugar and carbohydrates  Increasing activity/exercise  Talking with your doctor about reaching your blood sugar goals High blood sugars (greater than 180 mg/dL) can raise your risk of infections and slow your  recovery, so you will need to focus on controlling your diabetes during the weeks before surgery. Make sure that the doctor who takes care of your diabetes knows about your planned surgery including the date and location.  How do I manage my blood sugar before surgery? Check your blood sugar at least 4 times a day, starting 2 days before surgery, to make sure that the level is not too high or low.  Check your blood sugar the morning of your surgery when you wake up and every 2 hours until you get to the Short Stay unit.  If your blood sugar is less than 70 mg/dL, you will need to treat for low blood sugar: Treat a low blood sugar (less than 70 mg/dL) with  cup of clear juice (cranberry or apple) Recheck blood sugar in 15 minutes after treatment (to make sure it is greater than 70 mg/dL). If your blood sugar is not greater than 70 mg/dL on recheck, call [phone] for further instructions. Report your blood sugar to the short stay nurse when you get to Short Stay.  If you are admitted to the hospital after surgery: Your blood sugar will be checked by the staff and you will probably be given insulin  after surgery (instead of oral diabetes medicines) to make sure you have good blood sugar levels. The goal for blood sugar control after surgery is 80-180 mg/dL.      One week prior to surgery, STOP taking any Aspirin (unless otherwise instructed by your surgeon)  Aleve , Naproxen , Ibuprofen , Motrin , Advil , Goody's, BC's, all herbal medications, fish oil, and non-prescription vitamins.                     Do NOT Smoke (Tobacco/Vaping) and Do Not drink any alcohol for 24 hours prior to your procedure.  If you use a CPAP at night, you may bring your mask/headgear for your overnight stay.   You will be asked to remove any contacts, glasses, piercing's, hearing aid's, dentures/partials prior to surgery. Please bring cases for these items if needed.    Patients discharged the day of surgery will  not be allowed to drive home, and someone needs to stay with them for 24 hours.  SURGICAL WAITING ROOM VISITATION Patients may have no more than 2 support people in the waiting area - these visitors may rotate.   Pre-op nurse will coordinate an appropriate time for 1 ADULT support person, who may not rotate, to accompany patient in pre-op.  Children under the age of 45 must have an adult with them who is not the patient and must remain in the main waiting area with an adult.  If the patient needs to stay at the hospital during part of their recovery, the visitor guidelines for inpatient rooms apply.  Please refer to the [facility] website for the visitor guidelines for any additional information.   If you received a COVID test during your pre-op visit  it is requested that you wear a mask when out in public, stay away from anyone that may not be feeling well and notify your surgeon if you develop symptoms. If you have been in contact with anyone that has tested positive in the last 10 days please notify you surgeon.      Pre-operative CHG Bathing Instructions   You can play a key role in reducing the risk of infection after surgery. Your skin needs to be as free of germs as possible. You can reduce the number of germs on your skin by washing with CHG (chlorhexidine gluconate) soap before surgery. CHG is an antiseptic soap that kills germs and continues to kill germs even after washing.   DO NOT use if you have an allergy to chlorhexidine/CHG or antibacterial soaps. If your skin becomes reddened or irritated, stop using the CHG and notify Pre-Op nurse day of surgery.  Please get dial soap or other antibacterial soap and shower following the instructions below.             TAKE A SHOWER THE NIGHT BEFORE SURGERY AND THE DAY OF SURGERY    Please keep in mind the following:  DO NOT shave, including legs and underarms, 48 hours prior to surgery.   You may shave your face before/day of  surgery.  Place clean sheets on your bed the night before surgery Use a clean washcloth (not used since being washed) for each shower. DO NOT sleep with pet's night before surgery.  CHG Shower Instructions:  Wash your face and private area with normal soap. If you choose to wash your hair, wash first with your normal shampoo.  After you use shampoo/soap, rinse your hair and body thoroughly to remove shampoo/soap residue.  Turn the water OFF and apply half the bottle of CHG soap to a CLEAN washcloth.  Apply CHG soap ONLY FROM YOUR NECK DOWN TO YOUR TOES (washing for 3-5 minutes)  DO NOT use CHG soap on face, private areas, open wounds, or sores.  Pay special attention to  the area where your surgery is being performed.  If you are having back surgery, having someone wash your back for you may be helpful. Wait 2 minutes after CHG soap is applied, then you may rinse off the CHG soap.  Pat dry with a clean towel  Put on clean pajamas    Additional instructions for the day of surgery: DO NOT APPLY any lotions, powders,  oils, deodorants (may use underarm deodorant), cologne / perfumes  or  makeup Do not wear jewelry /  piercing's/  metal/  permanent jewelry must be removed prior to arrival day of surgery.  (No plastic piercing) Do not wear nail polish, gel polish, artificial nails, or any other type of covering on natural finger nails (toe nails are okay) Do not bring valuables to the hospital. [facility] is not responsible for valuables/personal belongings. Put on clean/comfortable clothes.  Please brush your teeth.  Ask your nurse before applying any prescription medications to the skin.

## 2024-04-22 NOTE — Progress Notes (Addendum)
 Spoke w/ via phone for pre-op interview--- pt Lab needs dos----  urine preg       Lab results------ lab appt 04-28-2024 getting CBC/ CMP/ T&S/ A1c/ EKG COVID test -----patient states asymptomatic no test needed Arrive at ------- 1100 on 04-29-2024 NPO after MN NO Solid Food.  Clear liquids from MN until--- 1000 Pre-Surgery Ensure or G2: n/a ERAS protocol;  yes  Med rec completed Medications to take morning of surgery -----  none Diabetic medication -----  do not take metformin  morning of surgery  GLP1 agonist last dose:  n/a GLP1 instructions:  Patient instructed no nail polish to be worn day of surgery Patient instructed to bring photo id and insurance card day of surgery Patient aware to have Driver (ride ) / caregiver    for 24 hours after surgery - friend,  [place] [employer] Patient Special Instructions -----  will pick up bag w/ soap and written instructions at lab appt Pre-Op special Instructions ----- n/a  Patient verbalized understanding of instructions that were given at this phone interview. Patient denies chest pain, sob, fever, cough at the interview.    Anesthesia Review:  DM2  (noted that Dr Tia Flowers had labs drawn 03-03-2024 in epic pt's glucose was 308 and A1c 12.8. ) Repeating A1c since pt is not getting PAT lab appt done until day before surgery this makes only  3 days short of 60 days.  Also,  reviewed education managing your diabetes before surgery which included checking blood sugar at least 3-4 times daily and watching diet.   Pt verbalized understanding.  Informed patient if her blood sugar is very high day of surgery possible anesthesia could cancel her surgery.  PCP:  Dr Phillis Breaker  Chest x-ray :  07-30-2022 EKG :  07-30-2022 Echo :  no Stress test: no Cardiac Cath : no  Activity level:  denies sob w/ any activity Sleep Study/ CPAP :  no Fasting Blood Sugar :  100--200    / Checks Blood Sugar -- times a day:  2 times weekly , fasting  Blood Thinner/  Instructions /Last Dose:  no ASA / Instructions/ Last Dose :  no

## 2024-04-28 ENCOUNTER — Encounter (HOSPITAL_COMMUNITY)
Admission: RE | Admit: 2024-04-28 | Discharge: 2024-04-28 | Disposition: A | Discharge status: Home / Self Care | Source: Ambulatory Visit | Attending: Obstetrics and Gynecology | Admitting: Obstetrics and Gynecology

## 2024-04-28 ENCOUNTER — Encounter: Payer: Self-pay | Admitting: Obstetrics and Gynecology

## 2024-04-28 ENCOUNTER — Encounter (HOSPITAL_COMMUNITY): Payer: Self-pay | Admitting: Anesthesiology

## 2024-04-28 ENCOUNTER — Telehealth: Payer: Self-pay | Admitting: *Deleted

## 2024-04-28 DIAGNOSIS — Z01818 Encounter for other preprocedural examination: Secondary | ICD-10-CM | POA: Diagnosis present

## 2024-04-28 DIAGNOSIS — R9431 Abnormal electrocardiogram [ECG] [EKG]: Secondary | ICD-10-CM

## 2024-04-28 LAB — HEMOGLOBIN A1C
Hgb A1c MFr Bld: 12 % — ABNORMAL HIGH (ref 4.8–5.6)
Mean Plasma Glucose: 297.7 mg/dL

## 2024-04-28 LAB — COMPREHENSIVE METABOLIC PANEL WITH GFR
ALT: 31 U/L (ref 0–44)
AST: 21 U/L (ref 15–41)
Albumin: 3.8 g/dL (ref 3.5–5.0)
Alkaline Phosphatase: 29 U/L — ABNORMAL LOW (ref 38–126)
Anion gap: 10 (ref 5–15)
BUN: 17 mg/dL (ref 6–20)
CO2: 23 mmol/L (ref 22–32)
Calcium: 9.7 mg/dL (ref 8.9–10.3)
Chloride: 104 mmol/L (ref 98–111)
Creatinine, Ser: 0.53 mg/dL (ref 0.44–1.00)
GFR, Estimated: >60 mL/min (ref >60)
Glucose, Bld: 263 mg/dL — ABNORMAL HIGH (ref 70–99)
Potassium: 4.3 mmol/L (ref 3.5–5.1)
Sodium: 137 mmol/L (ref 135–145)
Total Bilirubin: 0.8 mg/dL (ref 0.0–1.2)
Total Protein: 7.3 g/dL (ref 6.5–8.1)

## 2024-04-28 LAB — CBC
HCT: 38.8 % (ref 36.0–46.0)
Hemoglobin: 13 g/dL (ref 12.0–15.0)
MCH: 29.5 pg (ref 26.0–34.0)
MCHC: 33.5 g/dL (ref 30.0–36.0)
MCV: 88.2 fL (ref 80.0–100.0)
Platelets: 308 10*3/uL (ref 150–400)
RBC: 4.4 MIL/uL (ref 3.87–5.11)
RDW: 11.9 % (ref 11.5–15.5)
WBC: 5.5 10*3/uL (ref 4.0–10.5)
nRBC: 0 % (ref 0.0–0.2)

## 2024-04-28 LAB — TYPE AND SCREEN
ABO/RH(D): A POS
Antibody Screen: NEGATIVE

## 2024-04-28 NOTE — Telephone Encounter (Signed)
 Surgery for 04/29/24 cancelled.   Encounter closed.

## 2024-04-28 NOTE — Telephone Encounter (Signed)
-----   Message from Reinaldo Caras sent at 04/28/2024  1:26 PM EDT ----- Anesthesia said to do cardiac work-up and clearance. Patient notified. Referral placed.  Chassity may be working on canceling the case for me. I let her know since she is or was assisting. Johnetta Nab you Taner Rzepka

## 2024-04-28 NOTE — Telephone Encounter (Signed)
 Anesthesiology notified at [facility] of abnormal EKG.  Patient with poorly controlled DM and infarct of unknown age. They preferred waiting until cardiac clearance. Patient called and notified and counseled on the importance of work-up with them and she agreed. Referral placed.  To send to Mallie Seal to reschedule Patient denies any h/o or current chest pain or SOB.  Dr. Tia Flowers

## 2024-04-29 ENCOUNTER — Ambulatory Visit (HOSPITAL_COMMUNITY): Admission: RE | Admit: 2024-04-29 | Source: Home / Self Care | Admitting: Obstetrics and Gynecology

## 2024-04-29 DIAGNOSIS — Z01818 Encounter for other preprocedural examination: Secondary | ICD-10-CM

## 2024-04-29 HISTORY — DX: Personal history of other diseases of the female genital tract: Z87.42

## 2024-04-29 HISTORY — DX: Presence of spectacles and contact lenses: Z97.3

## 2024-04-29 HISTORY — DX: Pelvic and perineal pain: R10.2

## 2024-04-29 HISTORY — DX: Excessive and frequent menstruation with irregular cycle: N92.1

## 2024-04-29 HISTORY — DX: Pelvic and perineal pain unspecified side: R10.20

## 2024-04-29 HISTORY — DX: Personal history of cervical dysplasia: Z87.410

## 2024-04-29 HISTORY — DX: Type 2 diabetes mellitus without complications: E11.9

## 2024-04-29 HISTORY — DX: Other specified abnormal uterine and vaginal bleeding: N93.8

## 2024-04-29 HISTORY — DX: Dysmenorrhea, unspecified: N94.6

## 2024-04-29 HISTORY — DX: Adenomyosis of the uterus: N80.03

## 2024-04-29 SURGERY — HYSTERECTOMY, TOTAL, LAPAROSCOPIC, ROBOT-ASSISTED WITH SALPINGECTOMY
Anesthesia: General

## 2024-05-02 ENCOUNTER — Ambulatory Visit: Attending: Cardiovascular Disease | Admitting: Cardiovascular Disease

## 2024-05-02 VITALS — BP 106/64 | HR 87 | Ht 64.0 in | Wt 160.0 lb

## 2024-05-02 DIAGNOSIS — Z01818 Encounter for other preprocedural examination: Secondary | ICD-10-CM

## 2024-05-02 DIAGNOSIS — E119 Type 2 diabetes mellitus without complications: Secondary | ICD-10-CM | POA: Diagnosis present

## 2024-05-02 DIAGNOSIS — R9431 Abnormal electrocardiogram [ECG] [EKG]: Secondary | ICD-10-CM

## 2024-05-02 NOTE — Progress Notes (Signed)
 Cardiology Office Note:    Date:  05/02/2024   ID:  Linda Carlson, DOB 1981-11-29, MRN [id number]  PCP:  Claire Crick, MD   [facility] Providers Cardiologist:  None     Referring MD: Reinaldo Caras, MD   Chief Complaint  Patient presents with   Consult  Sneha Hashimoto is a 43 y.o. female who is being seen today for the evaluation of abnormal electrocardiogram at the request of Reinaldo Caras, MD.   History of Present Illness:    Darlyne Giovino is a 43 y.o. female with a hx of dysfunctional uterine bleeding due to fibroids, whose hysterectomy was canceled when the preoperative ECG was found to be abnormal (the automatic interpretation was of "septal infarct age undetermined).  She is here to discuss the ECG findings.  She does not have a history of coronary disease or previous myocardial infarction.  She does not have some risk factors for heart disease, primarily poorly controlled type 2 diabetes mellitus with most recent hemoglobin A1c 12.0%.  On the other hand her lipid profile is not too bad with an LDL cholesterol of 116, normal triglycerides and HDL cholesterol.  She does not smoke.  She does not have high blood pressure.  Her father did have a myocardial infarction, but not at a young age.  The patient specifically denies any chest pain at rest or with exertion, dyspnea at rest or with exertion, orthopnea, paroxysmal nocturnal dyspnea, syncope, palpitations, focal neurological deficits, intermittent claudication, lower extremity edema, unexplained weight gain, cough, hemoptysis or wheezing.   Past Medical History:  Diagnosis Date   Adenomyosis of uterus    Anemia    DUB (dysfunctional uterine bleeding)    Dysmenorrhea    Fatty liver 2014   History of abnormal cervical Pap smear    01-21-2023  ASCUS/ +HPV:   03-03-2024  ASCUS   History of cervical dysplasia    03-04-2023  s/p colposcopy  LSIL/  CIN 1   Menorrhagia with irregular cycle     Pain, pelvic, female    Seasonal allergic rhinitis    Type 2 diabetes mellitus (HCC)    followed by pcp;    (04-22-2024 pt stated checks blood sugar 2 times weekly fasting,  average 100-200)   Wears contact lenses     Past Surgical History:  Procedure Laterality Date   CESAREAN SECTION     2009  and  2011   LAPAROSCOPIC TUBAL LIGATION Bilateral 2013    Current Medications: Current Meds  Medication Sig   fluticasone  (FLONASE ) 50 MCG/ACT nasal spray Place 1 spray into both nostrils daily. (Patient taking differently: Place 1 spray into both nostrils daily as needed for allergies.)   Ibuprofen  (ADVIL  PO) Take 2 tablets by mouth as needed.   metFORMIN  (GLUCOPHAGE ) 500 MG tablet Take 1 tablet by mouth 2 (two) times daily.     Allergies:   Patient has no known allergies.   Social History   Socioeconomic History   Marital status: Divorced    Spouse name: Not on file   Number of children: Not on file   Years of education: Not on file   Highest education level: Not on file  Occupational History   Not on file  Tobacco Use   Smoking status: Never   Smokeless tobacco: Never  Vaping Use   Vaping status: Never Used  Substance and Sexual Activity   Alcohol use: Not Currently    Comment: rare   Drug use: Never  Sexual activity: Yes    Partners: Male    Birth control/protection: Surgical  Other Topics Concern   Not on file  Social History Narrative   Not on file   Social Drivers of Health   Financial Resource Strain: Not on file  Food Insecurity: Not on file  Transportation Needs: Not on file  Physical Activity: Not on file  Stress: Not on file  Social Connections: Not on file     Family History: The patient's family history includes Diabetes in her father and mother; Heart attack in her father; Hypertension in her mother; Ovarian cancer in her mother. There is no history of Breast cancer.  ROS:   Please see the history of present illness.     All other systems  reviewed and are negative.  EKGs/Labs/Other Studies Reviewed:    The following studies were reviewed today: Multiple ECG tracings from today, 04/28/2024, 07/30/2022, 01/21/2013. EKG Interpretation Date/Time:  Monday May 02 2024 14:39:37 EDT Ventricular Rate:  87 PR Interval:  154 QRS Duration:  74 QT Interval:  364 QTC Calculation: 438 R Axis:   21  Text Interpretation: Normal sinus rhythm Low voltage QRS When compared with ECG of 28-Apr-2024 10:08, No significant change was found Confirmed by Anushree Dorsi [phone]) on 05/02/2024 3:01:29 PM   Personally reviewed EKG tracing from 04/28/2024 which shows sinus rhythm, generalized low voltage and QS pattern in lead V1-V2, not changed from 2023  EKG Interpretation Date/Time:  Monday May 02 2024 14:39:37 EDT Ventricular Rate:  87 PR Interval:  154 QRS Duration:  74 QT Interval:  364 QTC Calculation: 438 R Axis:   21  Text Interpretation: Normal sinus rhythm Low voltage QRS When compared with ECG of 28-Apr-2024 10:08, No significant change was found Confirmed by Azeem Poorman ([id number]) on 05/02/2024 3:01:29 PM        Recent Labs: 04/28/2024: ALT 31; BUN 17; Creatinine, Ser 0.53; Hemoglobin 13.0; Platelets 308; Potassium 4.3; Sodium 137  Recent Lipid Panel    Component Value Date/Time   CHOL 175 03/14/2011 1008   TRIG 74.0 03/14/2011 1008   HDL 44.00 03/14/2011 1008   CHOLHDL 4 03/14/2011 1008   VLDL 14.8 03/14/2011 1008   LDLCALC 116 (H) 03/14/2011 1008     Risk Assessment/Calculations:                Physical Exam:    VS:  BP 106/64 (BP Location: Left Arm, Patient Position: Sitting, Cuff Size: Normal)   Pulse 87   Ht 5\' 4"  (1.626 m)   Wt 72.6 kg   LMP 04/14/2024 (Exact Date)   SpO2 96%   BMI 27.46 kg/m     Wt Readings from Last 3 Encounters:  05/02/24 72.6 kg  04/06/24 73 kg  03/03/24 74.8 kg     GEN: Mildly overweight, well nourished, well developed in no acute distress HEENT: Normal NECK: No JVD; No  carotid bruits LYMPHATICS: No lymphadenopathy CARDIAC: RRR, no murmurs, rubs, gallops RESPIRATORY:  Clear to auscultation without rales, wheezing or rhonchi  ABDOMEN: Soft, non-tender, non-distended MUSCULOSKELETAL:  No edema; No deformity  SKIN: Warm and dry NEUROLOGIC:  Alert and oriented x 3 PSYCHIATRIC:  Normal affect   ASSESSMENT:    1. Abnormal EKG   2. Pre-operative clearance   3. Type 2 diabetes mellitus without complication, without long-term current use of insulin  (HCC)    PLAN:    In order of problems listed above:  Abnormal ECG: There is really no abnormality on her ECG.  There are clear-cut small R waves in lead V1 and V2, with normal R wave progression across the anterior precordium.  It would be easy to reproduce the "septal infarct age undetermined" appearance by simply placing leads V1 and V2 a little bit too high on the anterior chest.  ECG performed in office today is frankly normal. DM2: This is poorly controlled and it would be very important to provide more aggressive glucose lowering interventions to achieve hemoglobin A1c less than 7%.  I think if glucose control were adequate, she would not need to start any additional lipid-lowering medications.  LDL is only slightly higher than target of 100 or less. Preoperative CV risk evaluation: She has normal functional status.  As mentioned the ECG abnormality is probably artifactual and is definitely not new, being present at least since 2023.  No additional cardiac evaluation is recommended prior to her hysterectomy, which is otherwise a low risk procedure for cardiovascular complications.           Medication Adjustments/Labs and Tests Ordered: Current medicines are reviewed at length with the patient today.  Concerns regarding medicines are outlined above.  Orders Placed This Encounter  Procedures   EKG 12-Lead   No orders of the defined types were placed in this encounter.   Patient Instructions  Medication  Instructions:  No changes *If you need a refill on your cardiac medications before your next appointment, please call your pharmacy*  Follow-Up: At [facility], you and your health needs are our priority.  As part of our continuing mission to provide you with exceptional heart care, our providers are all part of one team.  This team includes your primary Cardiologist (physician) and Advanced Practice Providers or APPs (Physician Assistants and Nurse Practitioners) who all work together to provide you with the care you need, when you need it.  Your next appointment:    Follow up as needed  Provider:   Dr Alvis Ba  We recommend signing up for the patient portal called "MyChart".  Sign up information is provided on this After Visit Summary.  MyChart is used to connect with patients for Virtual Visits (Telemedicine).  Patients are able to view lab/test results, encounter notes, upcoming appointments, etc.  Non-urgent messages can be sent to your provider as well.   To learn more about what you can do with MyChart, go to [email or web address].          Signed, Luana Rumple, MD  05/02/2024 3:04 PM    [facility]

## 2024-05-02 NOTE — H&P (View-Only) (Signed)
 Cardiology Office Note:    Date:  05/02/2024   ID:  Linda Carlson, DOB 1981-11-29, MRN [id number]  PCP:  Claire Crick, MD   [facility] Providers Cardiologist:  None     Referring MD: Reinaldo Caras, MD   Chief Complaint  Patient presents with   Consult  Linda Carlson is a 43 y.o. female who is being seen today for the evaluation of abnormal electrocardiogram at the request of Reinaldo Caras, MD.   History of Present Illness:    Linda Carlson is a 43 y.o. female with a hx of dysfunctional uterine bleeding due to fibroids, whose hysterectomy was canceled when the preoperative ECG was found to be abnormal (the automatic interpretation was of "septal infarct age undetermined).  She is here to discuss the ECG findings.  She does not have a history of coronary disease or previous myocardial infarction.  She does not have some risk factors for heart disease, primarily poorly controlled type 2 diabetes mellitus with most recent hemoglobin A1c 12.0%.  On the other hand her lipid profile is not too bad with an LDL cholesterol of 116, normal triglycerides and HDL cholesterol.  She does not smoke.  She does not have high blood pressure.  Her father did have a myocardial infarction, but not at a young age.  The patient specifically denies any chest pain at rest or with exertion, dyspnea at rest or with exertion, orthopnea, paroxysmal nocturnal dyspnea, syncope, palpitations, focal neurological deficits, intermittent claudication, lower extremity edema, unexplained weight gain, cough, hemoptysis or wheezing.   Past Medical History:  Diagnosis Date   Adenomyosis of uterus    Anemia    DUB (dysfunctional uterine bleeding)    Dysmenorrhea    Fatty liver 2014   History of abnormal cervical Pap smear    01-21-2023  ASCUS/ +HPV:   03-03-2024  ASCUS   History of cervical dysplasia    03-04-2023  s/p colposcopy  LSIL/  CIN 1   Menorrhagia with irregular cycle     Pain, pelvic, female    Seasonal allergic rhinitis    Type 2 diabetes mellitus (HCC)    followed by pcp;    (04-22-2024 pt stated checks blood sugar 2 times weekly fasting,  average 100-200)   Wears contact lenses     Past Surgical History:  Procedure Laterality Date   CESAREAN SECTION     2009  and  2011   LAPAROSCOPIC TUBAL LIGATION Bilateral 2013    Current Medications: Current Meds  Medication Sig   fluticasone  (FLONASE ) 50 MCG/ACT nasal spray Place 1 spray into both nostrils daily. (Patient taking differently: Place 1 spray into both nostrils daily as needed for allergies.)   Ibuprofen  (ADVIL  PO) Take 2 tablets by mouth as needed.   metFORMIN  (GLUCOPHAGE ) 500 MG tablet Take 1 tablet by mouth 2 (two) times daily.     Allergies:   Patient has no known allergies.   Social History   Socioeconomic History   Marital status: Divorced    Spouse name: Not on file   Number of children: Not on file   Years of education: Not on file   Highest education level: Not on file  Occupational History   Not on file  Tobacco Use   Smoking status: Never   Smokeless tobacco: Never  Vaping Use   Vaping status: Never Used  Substance and Sexual Activity   Alcohol use: Not Currently    Comment: rare   Drug use: Never  Sexual activity: Yes    Partners: Male    Birth control/protection: Surgical  Other Topics Concern   Not on file  Social History Narrative   Not on file   Social Drivers of Health   Financial Resource Strain: Not on file  Food Insecurity: Not on file  Transportation Needs: Not on file  Physical Activity: Not on file  Stress: Not on file  Social Connections: Not on file     Family History: The patient's family history includes Diabetes in her father and mother; Heart attack in her father; Hypertension in her mother; Ovarian cancer in her mother. There is no history of Breast cancer.  ROS:   Please see the history of present illness.     All other systems  reviewed and are negative.  EKGs/Labs/Other Studies Reviewed:    The following studies were reviewed today: Multiple ECG tracings from today, 04/28/2024, 07/30/2022, 01/21/2013. EKG Interpretation Date/Time:  Monday May 02 2024 14:39:37 EDT Ventricular Rate:  87 PR Interval:  154 QRS Duration:  74 QT Interval:  364 QTC Calculation: 438 R Axis:   21  Text Interpretation: Normal sinus rhythm Low voltage QRS When compared with ECG of 28-Apr-2024 10:08, No significant change was found Confirmed by Anushree Dorsi [phone]) on 05/02/2024 3:01:29 PM   Personally reviewed EKG tracing from 04/28/2024 which shows sinus rhythm, generalized low voltage and QS pattern in lead V1-V2, not changed from 2023  EKG Interpretation Date/Time:  Monday May 02 2024 14:39:37 EDT Ventricular Rate:  87 PR Interval:  154 QRS Duration:  74 QT Interval:  364 QTC Calculation: 438 R Axis:   21  Text Interpretation: Normal sinus rhythm Low voltage QRS When compared with ECG of 28-Apr-2024 10:08, No significant change was found Confirmed by Azeem Poorman ([id number]) on 05/02/2024 3:01:29 PM        Recent Labs: 04/28/2024: ALT 31; BUN 17; Creatinine, Ser 0.53; Hemoglobin 13.0; Platelets 308; Potassium 4.3; Sodium 137  Recent Lipid Panel    Component Value Date/Time   CHOL 175 03/14/2011 1008   TRIG 74.0 03/14/2011 1008   HDL 44.00 03/14/2011 1008   CHOLHDL 4 03/14/2011 1008   VLDL 14.8 03/14/2011 1008   LDLCALC 116 (H) 03/14/2011 1008     Risk Assessment/Calculations:                Physical Exam:    VS:  BP 106/64 (BP Location: Left Arm, Patient Position: Sitting, Cuff Size: Normal)   Pulse 87   Ht 5\' 4"  (1.626 m)   Wt 72.6 kg   LMP 04/14/2024 (Exact Date)   SpO2 96%   BMI 27.46 kg/m     Wt Readings from Last 3 Encounters:  05/02/24 72.6 kg  04/06/24 73 kg  03/03/24 74.8 kg     GEN: Mildly overweight, well nourished, well developed in no acute distress HEENT: Normal NECK: No JVD; No  carotid bruits LYMPHATICS: No lymphadenopathy CARDIAC: RRR, no murmurs, rubs, gallops RESPIRATORY:  Clear to auscultation without rales, wheezing or rhonchi  ABDOMEN: Soft, non-tender, non-distended MUSCULOSKELETAL:  No edema; No deformity  SKIN: Warm and dry NEUROLOGIC:  Alert and oriented x 3 PSYCHIATRIC:  Normal affect   ASSESSMENT:    1. Abnormal EKG   2. Pre-operative clearance   3. Type 2 diabetes mellitus without complication, without long-term current use of insulin  (HCC)    PLAN:    In order of problems listed above:  Abnormal ECG: There is really no abnormality on her ECG.  There are clear-cut small R waves in lead V1 and V2, with normal R wave progression across the anterior precordium.  It would be easy to reproduce the "septal infarct age undetermined" appearance by simply placing leads V1 and V2 a little bit too high on the anterior chest.  ECG performed in office today is frankly normal. DM2: This is poorly controlled and it would be very important to provide more aggressive glucose lowering interventions to achieve hemoglobin A1c less than 7%.  I think if glucose control were adequate, she would not need to start any additional lipid-lowering medications.  LDL is only slightly higher than target of 100 or less. Preoperative CV risk evaluation: She has normal functional status.  As mentioned the ECG abnormality is probably artifactual and is definitely not new, being present at least since 2023.  No additional cardiac evaluation is recommended prior to her hysterectomy, which is otherwise a low risk procedure for cardiovascular complications.           Medication Adjustments/Labs and Tests Ordered: Current medicines are reviewed at length with the patient today.  Concerns regarding medicines are outlined above.  Orders Placed This Encounter  Procedures   EKG 12-Lead   No orders of the defined types were placed in this encounter.   Patient Instructions  Medication  Instructions:  No changes *If you need a refill on your cardiac medications before your next appointment, please call your pharmacy*  Follow-Up: At [facility], you and your health needs are our priority.  As part of our continuing mission to provide you with exceptional heart care, our providers are all part of one team.  This team includes your primary Cardiologist (physician) and Advanced Practice Providers or APPs (Physician Assistants and Nurse Practitioners) who all work together to provide you with the care you need, when you need it.  Your next appointment:    Follow up as needed  Provider:   Dr Alvis Ba  We recommend signing up for the patient portal called "MyChart".  Sign up information is provided on this After Visit Summary.  MyChart is used to connect with patients for Virtual Visits (Telemedicine).  Patients are able to view lab/test results, encounter notes, upcoming appointments, etc.  Non-urgent messages can be sent to your provider as well.   To learn more about what you can do with MyChart, go to [email or web address].          Signed, Luana Rumple, MD  05/02/2024 3:04 PM    [facility]

## 2024-05-02 NOTE — Telephone Encounter (Signed)
 See cardiology visit dated 05/02/24.

## 2024-05-02 NOTE — Patient Instructions (Signed)
 Medication Instructions:  No changes *If you need a refill on your cardiac medications before your next appointment, please call your pharmacy*  Follow-Up: At [facility], you and your health needs are our priority.  As part of our continuing mission to provide you with exceptional heart care, our providers are all part of one team.  This team includes your primary Cardiologist (physician) and Advanced Practice Providers or APPs (Physician Assistants and Nurse Practitioners) who all work together to provide you with the care you need, when you need it.  Your next appointment:    Follow up as needed  Provider:   Dr Alvis Ba  We recommend signing up for the patient portal called "MyChart".  Sign up information is provided on this After Visit Summary.  MyChart is used to connect with patients for Virtual Visits (Telemedicine).  Patients are able to view lab/test results, encounter notes, upcoming appointments, etc.  Non-urgent messages can be sent to your provider as well.   To learn more about what you can do with MyChart, go to [email or web address].

## 2024-05-12 ENCOUNTER — Encounter: Admitting: Obstetrics and Gynecology

## 2024-05-25 ENCOUNTER — Encounter (HOSPITAL_COMMUNITY): Payer: Self-pay | Admitting: Obstetrics and Gynecology

## 2024-05-25 NOTE — Pre-Procedure Instructions (Signed)
 Surgical Instructions   Your procedure is scheduled on :  Wednesday,  06-01-2024. Report to [facility] Main Entrance "A" at 1:00 P.M., then check in with the Admitting office. Any questions or running late day of surgery: call [phone]  Questions prior to your surgery date: call [phone], Monday-Friday, 8am-4pm. If you experience any cold or flu symptoms such as cough, fever, chills, shortness of breath, etc. between now and your scheduled surgery, please notify your surgeon office.    Remember:  Do not eat any food after midnight the night before your surgery.  You may have clear liquids from midnight night before surgery until 12:00 PM.   You may drink clear liquids until 12:00 PM the morning of your surgery.   Clear liquids allowed are:  Water Carbonated Beverages (only diet or NO sugar options Clear Tea (may use non-sweetener, no milk, honey, etc.) Black Coffee Only (may use non-sweetener, NO MILK, CREAM OR POWDERED CREAMER of any kind) Sport drink, like Gatorade  (only diet or NO sugar options)  NO clear liquids after 12:00 PM day of surgery.  This includes no water,  candy,  gum,  and  mints.    Take these medicines the morning of surgery with A SIP OF WATER :   May take these medicines IF NEEDED: sips of water Fluticasone  (Flonase ) nasal spray   WHAT DO I DO ABOUT MY DIABETES MEDICATION?  Do not take oral diabetes medicines (pills) the morning of surgery.  HOW TO MANAGE YOUR DIABETES BEFORE AND AFTER SURGERY  Why is it important to control my blood sugar before and after surgery? Improving blood sugar levels before and after surgery helps healing and can limit problems. A way of improving blood sugar control is eating a healthy diet by:  Eating less sugar and carbohydrates  Increasing activity/exercise  Talking with your doctor about reaching your blood sugar goals High blood sugars (greater than 180 mg/dL) can raise your risk of infections and slow your  recovery, so you will need to focus on controlling your diabetes during the weeks before surgery. Make sure that the doctor who takes care of your diabetes knows about your planned surgery including the date and location.  How do I manage my blood sugar before surgery? Check your blood sugar at least 4 times a day, starting 2 days before surgery, to make sure that the level is not too high or low.  Check your blood sugar the morning of your surgery when you wake up and every 2 hours until you get to the Short Stay unit.  If your blood sugar is less than 70 mg/dL, you will need to treat for low blood sugar: Treat a low blood sugar (less than 70 mg/dL) with  cup of clear juice (cranberry or apple) Recheck blood sugar in 15 minutes after treatment (to make sure it is greater than 70 mg/dL). If your blood sugar is not greater than 70 mg/dL on recheck, call [phone] for further instructions. Report your blood sugar to the short stay nurse when you get to Short Stay.  If you are admitted to the hospital after surgery: Your blood sugar will be checked by the staff and you will probably be given insulin  after surgery (instead of oral diabetes medicines) to make sure you have good blood sugar levels. The goal for blood sugar control after surgery is 80-180 mg/dL.      One week prior to surgery, STOP taking any Aspirin (unless otherwise instructed by your surgeon)  Aleve , Naproxen , Ibuprofen , Motrin , Advil , Goody's, BC's, all herbal medications, fish oil, and non-prescription vitamins.                     Do NOT Smoke (Tobacco/Vaping) and Do Not drink any alcohol for 24 hours prior to your procedure.  If you use a CPAP at night, you may bring your mask/headgear for your overnight stay.   You will be asked to remove any contacts, glasses, piercing's, hearing aid's, dentures/partials prior to surgery. Please bring cases for these items if needed.    Patients discharged the day of surgery will  not be allowed to drive home, and someone needs to stay with them for 24 hours.  SURGICAL WAITING ROOM VISITATION Patients may have no more than 2 support people in the waiting area - these visitors may rotate.   Pre-op nurse will coordinate an appropriate time for 1 ADULT support person, who may not rotate, to accompany patient in pre-op.  Children under the age of 78 must have an adult with them who is not the patient and must remain in the main waiting area with an adult.  If the patient needs to stay at the hospital during part of their recovery, the visitor guidelines for inpatient rooms apply.  Please refer to the [facility] website for the visitor guidelines for any additional information.   If you received a COVID test during your pre-op visit  it is requested that you wear a mask when out in public, stay away from anyone that may not be feeling well and notify your surgeon if you develop symptoms. If you have been in contact with anyone that has tested positive in the last 10 days please notify you surgeon.      Pre-operative CHG Bathing Instructions   You can play a key role in reducing the risk of infection after surgery. Your skin needs to be as free of germs as possible. You can reduce the number of germs on your skin by washing with CHG (chlorhexidine gluconate) soap before surgery. CHG is an antiseptic soap that kills germs and continues to kill germs even after washing.   DO NOT use if you have an allergy to chlorhexidine/CHG or antibacterial soaps. If your skin becomes reddened or irritated, stop using the CHG and notify Pre-Op nurse day of surgery.  Please get dial soap or other antibacterial soap and shower following the instructions below.             TAKE A SHOWER THE NIGHT BEFORE SURGERY AND THE DAY OF SURGERY    Please keep in mind the following:  DO NOT shave, including legs and underarms, 48 hours prior to surgery.   You may shave your face before/day of  surgery.  Place clean sheets on your bed the night before surgery Use a clean washcloth (not used since being washed) for each shower. DO NOT sleep with pet's night before surgery.  CHG Shower Instructions:  Wash your face and private area with normal soap. If you choose to wash your hair, wash first with your normal shampoo.  After you use shampoo/soap, rinse your hair and body thoroughly to remove shampoo/soap residue.  Turn the water OFF and apply half the bottle of CHG soap to a CLEAN washcloth.  Apply CHG soap ONLY FROM YOUR NECK DOWN TO YOUR TOES (washing for 3-5 minutes)  DO NOT use CHG soap on face, private areas, open wounds, or sores.  Pay special attention to  the area where your surgery is being performed.  If you are having back surgery, having someone wash your back for you may be helpful. Wait 2 minutes after CHG soap is applied, then you may rinse off the CHG soap.  Pat dry with a clean towel  Put on clean pajamas    Additional instructions for the day of surgery: DO NOT APPLY any lotions, powder,  oils, deodorants (may use underarm deodorant), cologne / perfumes  or  makeup Do not wear jewelry /  piercing's/  metal/  permanent jewelry must be removed prior to arrival day of surgery.  (No plastic piercing) Do not wear nail polish, gel polish, artificial nails, or any other type of covering on natural finger nails (toe nails are okay) Do not bring valuables to the hospital. [facility] is not responsible for valuables/personal belongings. Put on clean/comfortable clothes.  Please brush your teeth.  Ask your nurse before applying any prescription medications to the skin.

## 2024-05-25 NOTE — Progress Notes (Signed)
 Spoke w/ via phone for pre-op interview--- pt Lab needs dos----  urine preg       Lab results------ lab appt 05-30-2024 @ 1030 getting CBC/ CMP/ T&S/ Current EKG dated 05-02-2024 in epic/ chart;   current A1c (dated 04-28-2024 in epic, 12.0 COVID test -----patient states asymptomatic no test needed Arrive at ------- 1300 on 06-01-2024 NPO after MN NO Solid Food.  Clear liquids from MN until--- 1200 Pre-Surgery Ensure or G2: n/a ERAS protocol;  yes  Med rec completed Medications to take morning of surgery -----  none Diabetic medication -----  do not take metformin  morning of surgery  GLP1 agonist last dose:  n/a GLP1 instructions:  Patient instructed no nail polish to be worn day of surgery Patient instructed to bring photo id and insurance card day of surgery Patient aware to have Driver (ride ) / caregiver    for 24 hours after surgery - friend,  [place] [employer] Patient Special Instructions -----  will pick up bag w/ soap and written instructions at lab appt Pre-Op special Instructions -----   Patient verbalized understanding of instructions that were given at this phone interview. Patient denies chest pain, sob, fever, cough at the interview.    Anesthesia Review:  DM2  ;   last A1c done 04-28-2024, 12.0;  pt previously scheduled on 04-29-2024 but rescheduled due to Dr Tia Flowers requested pt have cardiology evaluation for abnormal EKG.  Pt had cardiology office visit w/ Dr Alvis Ba on 05-02-2024 (in epic/ chart) stated pt okay for surgery.  PCP:  Dr Phillis Breaker  Chest x-ray :  07-30-2022 EKG :  05-02-2024 Echo :  no Stress test: no Cardiac Cath : no  Activity level:  denies sob w/ any activity Sleep Study/ CPAP :  no Fasting Blood Sugar :  100--200    / Checks Blood Sugar -- times a day:  2 times weekly , fasting  Blood Thinner/ Instructions /Last Dose:  no ASA / Instructions/ Last Dose :  no

## 2024-05-30 ENCOUNTER — Encounter (HOSPITAL_COMMUNITY)
Admission: RE | Admit: 2024-05-30 | Discharge: 2024-05-30 | Disposition: A | Discharge status: Home / Self Care | Source: Ambulatory Visit | Attending: Obstetrics and Gynecology | Admitting: Obstetrics and Gynecology

## 2024-05-30 ENCOUNTER — Ambulatory Visit: Payer: Self-pay | Admitting: Obstetrics and Gynecology

## 2024-05-30 DIAGNOSIS — Z01812 Encounter for preprocedural laboratory examination: Secondary | ICD-10-CM | POA: Insufficient documentation

## 2024-05-30 DIAGNOSIS — Z01818 Encounter for other preprocedural examination: Secondary | ICD-10-CM | POA: Diagnosis present

## 2024-05-30 DIAGNOSIS — E0801 Diabetes mellitus due to underlying condition with hyperosmolarity with coma: Secondary | ICD-10-CM

## 2024-05-30 LAB — COMPREHENSIVE METABOLIC PANEL WITH GFR
ALT: 24 U/L (ref 0–44)
AST: 16 U/L (ref 15–41)
Albumin: 4 g/dL (ref 3.5–5.0)
Alkaline Phosphatase: 27 U/L — ABNORMAL LOW (ref 38–126)
Anion gap: 8 (ref 5–15)
BUN: 17 mg/dL (ref 6–20)
CO2: 22 mmol/L (ref 22–32)
Calcium: 9.2 mg/dL (ref 8.9–10.3)
Chloride: 102 mmol/L (ref 98–111)
Creatinine, Ser: 0.63 mg/dL (ref 0.44–1.00)
GFR, Estimated: >60 mL/min (ref >60)
Glucose, Bld: 302 mg/dL — ABNORMAL HIGH (ref 70–99)
Potassium: 4.1 mmol/L (ref 3.5–5.1)
Sodium: 132 mmol/L — ABNORMAL LOW (ref 135–145)
Total Bilirubin: 1.1 mg/dL (ref 0.0–1.2)
Total Protein: 7.6 g/dL (ref 6.5–8.1)

## 2024-05-30 LAB — CBC
HCT: 39.6 % (ref 36.0–46.0)
Hemoglobin: 13.5 g/dL (ref 12.0–15.0)
MCH: 29.5 pg (ref 26.0–34.0)
MCHC: 34.1 g/dL (ref 30.0–36.0)
MCV: 86.5 fL (ref 80.0–100.0)
Platelets: 309 10*3/uL (ref 150–400)
RBC: 4.58 MIL/uL (ref 3.87–5.11)
RDW: 11.9 % (ref 11.5–15.5)
WBC: 6.8 10*3/uL (ref 4.0–10.5)
nRBC: 0 % (ref 0.0–0.2)

## 2024-05-30 LAB — TYPE AND SCREEN
ABO/RH(D): A POS
Antibody Screen: NEGATIVE

## 2024-05-31 MED ORDER — SODIUM CHLORIDE 0.9 % IV SOLN
Freq: Once | INTRAVENOUS | Status: DC
  Filled 2024-05-31 (×2): qty 10

## 2024-06-01 ENCOUNTER — Ambulatory Visit (HOSPITAL_COMMUNITY)
Admission: RE | Admit: 2024-06-01 | Discharge: 2024-06-01 | Disposition: A | Discharge status: Home / Self Care | Attending: Obstetrics and Gynecology | Admitting: Obstetrics and Gynecology

## 2024-06-01 ENCOUNTER — Ambulatory Visit (HOSPITAL_COMMUNITY): Admitting: Certified Registered Nurse Anesthetist

## 2024-06-01 ENCOUNTER — Other Ambulatory Visit: Payer: Self-pay

## 2024-06-01 ENCOUNTER — Encounter (HOSPITAL_COMMUNITY): Payer: Self-pay | Admitting: Obstetrics and Gynecology

## 2024-06-01 ENCOUNTER — Encounter (HOSPITAL_COMMUNITY)
Admission: RE | Disposition: A | Discharge status: Home / Self Care | Payer: Self-pay | Source: Home / Self Care | Attending: Obstetrics and Gynecology

## 2024-06-01 DIAGNOSIS — Z79899 Other long term (current) drug therapy: Secondary | ICD-10-CM | POA: Diagnosis not present

## 2024-06-01 DIAGNOSIS — D500 Iron deficiency anemia secondary to blood loss (chronic): Secondary | ICD-10-CM | POA: Diagnosis not present

## 2024-06-01 DIAGNOSIS — N8003 Adenomyosis of the uterus: Secondary | ICD-10-CM

## 2024-06-01 DIAGNOSIS — Z833 Family history of diabetes mellitus: Secondary | ICD-10-CM | POA: Insufficient documentation

## 2024-06-01 DIAGNOSIS — Z8742 Personal history of other diseases of the female genital tract: Secondary | ICD-10-CM | POA: Diagnosis present

## 2024-06-01 DIAGNOSIS — N870 Mild cervical dysplasia: Secondary | ICD-10-CM | POA: Diagnosis not present

## 2024-06-01 DIAGNOSIS — N946 Dysmenorrhea, unspecified: Secondary | ICD-10-CM | POA: Diagnosis present

## 2024-06-01 DIAGNOSIS — N72 Inflammatory disease of cervix uteri: Secondary | ICD-10-CM | POA: Diagnosis not present

## 2024-06-01 DIAGNOSIS — N920 Excessive and frequent menstruation with regular cycle: Secondary | ICD-10-CM | POA: Insufficient documentation

## 2024-06-01 DIAGNOSIS — E119 Type 2 diabetes mellitus without complications: Secondary | ICD-10-CM | POA: Insufficient documentation

## 2024-06-01 DIAGNOSIS — N921 Excessive and frequent menstruation with irregular cycle: Secondary | ICD-10-CM | POA: Diagnosis not present

## 2024-06-01 DIAGNOSIS — Z7984 Long term (current) use of oral hypoglycemic drugs: Secondary | ICD-10-CM | POA: Diagnosis not present

## 2024-06-01 DIAGNOSIS — N938 Other specified abnormal uterine and vaginal bleeding: Secondary | ICD-10-CM | POA: Diagnosis present

## 2024-06-01 DIAGNOSIS — Z01818 Encounter for other preprocedural examination: Secondary | ICD-10-CM

## 2024-06-01 HISTORY — PX: HYSTERECTOMY, TOTAL, LAPAROSCOPIC, ROBOT-ASSISTED WITH SALPINGECTOMY: SHX7587

## 2024-06-01 HISTORY — PX: CYSTOSCOPY: SHX5120

## 2024-06-01 LAB — POCT I-STAT EG7
Acid-base deficit: 2 mmol/L (ref 0.0–2.0)
Bicarbonate: 23.6 mmol/L (ref 20.0–28.0)
Calcium, Ion: 1.22 mmol/L (ref 1.15–1.40)
HCT: 34 % — ABNORMAL LOW (ref 36.0–46.0)
Hemoglobin: 11.6 g/dL — ABNORMAL LOW (ref 12.0–15.0)
O2 Saturation: 34 %
Potassium: 3.9 mmol/L (ref 3.5–5.1)
Sodium: 140 mmol/L (ref 135–145)
TCO2: 25 mmol/L (ref 22–32)
pCO2, Ven: 44 mmHg (ref 44–60)
pH, Ven: 7.337 (ref 7.25–7.43)
pO2, Ven: 22 mmHg — CL (ref 32–45)

## 2024-06-01 LAB — GLUCOSE, CAPILLARY
Glucose-Capillary: 322 mg/dL — ABNORMAL HIGH (ref 70–99)
Glucose-Capillary: 269 mg/dL — ABNORMAL HIGH (ref 70–99)
Glucose-Capillary: 253 mg/dL — ABNORMAL HIGH (ref 70–99)
Glucose-Capillary: 200 mg/dL — ABNORMAL HIGH (ref 70–99)

## 2024-06-01 LAB — POCT PREGNANCY, URINE: Preg Test, Ur: NEGATIVE

## 2024-06-01 SURGERY — HYSTERECTOMY, TOTAL, LAPAROSCOPIC, ROBOT-ASSISTED WITH SALPINGECTOMY
Anesthesia: General | Site: Bladder

## 2024-06-01 MED ORDER — ROCURONIUM BROMIDE 10 MG/ML (PF) SYRINGE
PREFILLED_SYRINGE | INTRAVENOUS | Status: AC
  Filled 2024-06-01: qty 10

## 2024-06-01 MED ORDER — METRONIDAZOLE 500 MG/100ML IV SOLN
500.0000 mg | INTRAVENOUS | Status: AC
  Administered 2024-06-01: 500 mg via INTRAVENOUS
  Filled 2024-06-01: qty 100

## 2024-06-01 MED ORDER — ONDANSETRON HCL 4 MG/2ML IJ SOLN
INTRAMUSCULAR | Status: AC
  Filled 2024-06-01: qty 2

## 2024-06-01 MED ORDER — FENTANYL CITRATE (PF) 250 MCG/5ML IJ SOLN
INTRAMUSCULAR | Status: DC | PRN
Start: 2024-06-01 — End: 2024-06-01
  Administered 2024-06-01 (×3): 50 ug via INTRAVENOUS
  Administered 2024-06-01: 100 ug via INTRAVENOUS

## 2024-06-01 MED ORDER — ONDANSETRON HCL 4 MG/2ML IJ SOLN
INTRAMUSCULAR | Status: DC | PRN
  Administered 2024-06-01: 4 mg via INTRAVENOUS

## 2024-06-01 MED ORDER — OXYCODONE HCL 5 MG/5ML PO SOLN: 5.0000 mg | Freq: Once | ORAL | Status: DC | PRN

## 2024-06-01 MED ORDER — SCOPOLAMINE 1 MG/3DAYS TD PT72
1.0000 | MEDICATED_PATCH | TRANSDERMAL | Status: DC
  Administered 2024-06-01: 1.5 mg via TRANSDERMAL

## 2024-06-01 MED ORDER — GLIPIZIDE-METFORMIN HCL 2.5-500 MG PO TABS: 1.0000 | ORAL_TABLET | Freq: Two times a day (BID) | ORAL | 3 refills | Status: DC

## 2024-06-01 MED ORDER — MIDAZOLAM HCL 2 MG/2ML IJ SOLN
INTRAMUSCULAR | Status: AC
  Filled 2024-06-01: qty 2

## 2024-06-01 MED ORDER — ONDANSETRON HCL 4 MG/2ML IJ SOLN: 4.0000 mg | Freq: Four times a day (QID) | INTRAMUSCULAR | Status: DC | PRN

## 2024-06-01 MED ORDER — PHENYLEPHRINE 80 MCG/ML (10ML) SYRINGE FOR IV PUSH (FOR BLOOD PRESSURE SUPPORT)
PREFILLED_SYRINGE | INTRAVENOUS | Status: DC | PRN
  Administered 2024-06-01 (×2): 80 ug via INTRAVENOUS

## 2024-06-01 MED ORDER — BUPIVACAINE HCL (PF) 0.5 % IJ SOLN
INTRAMUSCULAR | Status: AC
  Filled 2024-06-01: qty 30

## 2024-06-01 MED ORDER — ONDANSETRON HCL 4 MG PO TABS: 4.0000 mg | ORAL_TABLET | Freq: Four times a day (QID) | ORAL | Status: DC | PRN

## 2024-06-01 MED ORDER — PROPOFOL 10 MG/ML IV BOLUS
INTRAVENOUS | Status: DC | PRN
  Administered 2024-06-01: 120 mg via INTRAVENOUS

## 2024-06-01 MED ORDER — ROCURONIUM BROMIDE 10 MG/ML (PF) SYRINGE
PREFILLED_SYRINGE | INTRAVENOUS | Status: DC | PRN
  Administered 2024-06-01: 60 mg via INTRAVENOUS
  Administered 2024-06-01 (×2): 10 mg via INTRAVENOUS

## 2024-06-01 MED ORDER — LACTATED RINGERS IV SOLN: INTRAVENOUS | Status: DC

## 2024-06-01 MED ORDER — SUGAMMADEX SODIUM 200 MG/2ML IV SOLN
INTRAVENOUS | Status: DC | PRN
  Administered 2024-06-01: 150 mg via INTRAVENOUS
  Administered 2024-06-01: 50 mg via INTRAVENOUS

## 2024-06-01 MED ORDER — INSULIN ASPART 100 UNIT/ML IJ SOLN
5.0000 [IU] | Freq: Once | INTRAMUSCULAR | Status: AC
  Administered 2024-06-01: 5 [IU] via SUBCUTANEOUS

## 2024-06-01 MED ORDER — IBUPROFEN 600 MG PO TABS
600.0000 mg | ORAL_TABLET | Freq: Four times a day (QID) | ORAL | Status: DC
Start: 2024-06-01 — End: 2024-06-04
  Filled 2024-06-01: qty 1

## 2024-06-01 MED ORDER — CHLORHEXIDINE GLUCONATE 0.12 % MT SOLN
OROMUCOSAL | Status: AC
  Filled 2024-06-01: qty 15

## 2024-06-01 MED ORDER — PANTOPRAZOLE SODIUM 40 MG IV SOLR: 40.0000 mg | Freq: Every day | INTRAVENOUS | Status: DC

## 2024-06-01 MED ORDER — FENTANYL CITRATE (PF) 250 MCG/5ML IJ SOLN
INTRAMUSCULAR | Status: AC
  Filled 2024-06-01: qty 5

## 2024-06-01 MED ORDER — OXYCODONE HCL 5 MG PO TABS: 5.0000 mg | ORAL_TABLET | ORAL | Status: DC | PRN

## 2024-06-01 MED ORDER — ORAL CARE MOUTH RINSE: 15.0000 mL | Freq: Once | OROMUCOSAL | Status: AC

## 2024-06-01 MED ORDER — POVIDONE-IODINE 10 % EX SWAB
2.0000 | Freq: Once | CUTANEOUS | Status: AC
  Administered 2024-06-01: 2 via TOPICAL

## 2024-06-01 MED ORDER — FENTANYL CITRATE (PF) 100 MCG/2ML IJ SOLN
INTRAMUSCULAR | Status: AC
  Filled 2024-06-01: qty 2

## 2024-06-01 MED ORDER — ACETAMINOPHEN 325 MG PO TABS
650.0000 mg | ORAL_TABLET | ORAL | Status: DC | PRN
Start: 2024-06-01 — End: 2024-06-02

## 2024-06-01 MED ORDER — OXYCODONE HCL 5 MG PO TABS: 5.0000 mg | ORAL_TABLET | Freq: Once | ORAL | Status: DC | PRN

## 2024-06-01 MED ORDER — MIDAZOLAM HCL 2 MG/2ML IJ SOLN
INTRAMUSCULAR | Status: DC | PRN
  Administered 2024-06-01: 2 mg via INTRAVENOUS

## 2024-06-01 MED ORDER — SODIUM CHLORIDE 0.9 % IV SOLN
2.0000 g | INTRAVENOUS | Status: AC
  Administered 2024-06-01: 2 g via INTRAVENOUS
  Filled 2024-06-01: qty 2

## 2024-06-01 MED ORDER — ACETAMINOPHEN 500 MG PO TABS
1000.0000 mg | ORAL_TABLET | ORAL | Status: AC
  Administered 2024-06-01: 1000 mg via ORAL

## 2024-06-01 MED ORDER — KETOROLAC TROMETHAMINE 30 MG/ML IJ SOLN
INTRAMUSCULAR | Status: DC | PRN
  Administered 2024-06-01: 30 mg via INTRAVENOUS

## 2024-06-01 MED ORDER — DROPERIDOL 2.5 MG/ML IJ SOLN
0.6250 mg | Freq: Once | INTRAMUSCULAR | Status: AC | PRN
  Administered 2024-06-01: 0.625 mg via INTRAVENOUS

## 2024-06-01 MED ORDER — DEXAMETHASONE SODIUM PHOSPHATE 10 MG/ML IJ SOLN
INTRAMUSCULAR | Status: AC
Start: 2024-06-01 — End: ?
  Filled 2024-06-01: qty 1

## 2024-06-01 MED ORDER — PROPOFOL 10 MG/ML IV BOLUS
INTRAVENOUS | Status: AC
Start: 2024-06-01 — End: ?
  Filled 2024-06-01: qty 20

## 2024-06-01 MED ORDER — SODIUM CHLORIDE 0.9 % IV SOLN
INTRAVENOUS | Status: DC | PRN
  Administered 2024-06-01: 1000 mL

## 2024-06-01 MED ORDER — KETOROLAC TROMETHAMINE 30 MG/ML IJ SOLN
INTRAMUSCULAR | Status: AC
  Filled 2024-06-01: qty 1

## 2024-06-01 MED ORDER — BUPIVACAINE HCL (PF) 0.5 % IJ SOLN
INTRAMUSCULAR | Status: DC | PRN
  Administered 2024-06-01: 18 mL

## 2024-06-01 MED ORDER — SCOPOLAMINE 1 MG/3DAYS TD PT72
MEDICATED_PATCH | TRANSDERMAL | Status: AC
  Filled 2024-06-01: qty 1

## 2024-06-01 MED ORDER — HYDROMORPHONE HCL 1 MG/ML IJ SOLN: 0.2000 mg | INTRAMUSCULAR | Status: DC | PRN

## 2024-06-01 MED ORDER — 0.9 % SODIUM CHLORIDE (POUR BTL) OPTIME
TOPICAL | Status: DC | PRN
  Administered 2024-06-01: 1000 mL

## 2024-06-01 MED ORDER — ACETAMINOPHEN 500 MG PO TABS
ORAL_TABLET | ORAL | Status: AC
  Filled 2024-06-01: qty 2

## 2024-06-01 MED ORDER — LIDOCAINE 2% (20 MG/ML) 5 ML SYRINGE
INTRAMUSCULAR | Status: AC
Start: 2024-06-01 — End: ?
  Filled 2024-06-01: qty 5

## 2024-06-01 MED ORDER — ACETAMINOPHEN 10 MG/ML IV SOLN: 1000.0000 mg | Freq: Once | INTRAVENOUS | Status: DC | PRN

## 2024-06-01 MED ORDER — CHLORHEXIDINE GLUCONATE 0.12 % MT SOLN
15.0000 mL | Freq: Once | OROMUCOSAL | Status: AC
  Administered 2024-06-01: 15 mL via OROMUCOSAL

## 2024-06-01 MED ORDER — INSULIN ASPART 100 UNIT/ML IJ SOLN
INTRAMUSCULAR | Status: DC | PRN
  Administered 2024-06-01: 6 [IU] via SUBCUTANEOUS

## 2024-06-01 MED ORDER — FENTANYL CITRATE (PF) 100 MCG/2ML IJ SOLN
25.0000 ug | INTRAMUSCULAR | Status: DC | PRN
  Administered 2024-06-01: 50 ug via INTRAVENOUS

## 2024-06-01 MED ORDER — DROPERIDOL 2.5 MG/ML IJ SOLN
INTRAMUSCULAR | Status: AC
  Filled 2024-06-01: qty 2

## 2024-06-01 MED ORDER — LIDOCAINE 2% (20 MG/ML) 5 ML SYRINGE
INTRAMUSCULAR | Status: DC | PRN
  Administered 2024-06-01: 70 mg via INTRAVENOUS

## 2024-06-01 MED ORDER — MOUNJARO 2.5 MG/0.5ML ~~LOC~~ SOAJ: 2.5000 mg | SUBCUTANEOUS | 0 refills | Status: DC

## 2024-06-01 MED ORDER — DEXMEDETOMIDINE HCL IN NACL 80 MCG/20ML IV SOLN
INTRAVENOUS | Status: DC | PRN
  Administered 2024-06-01: 8 ug via INTRAVENOUS

## 2024-06-01 SURGICAL SUPPLY — 55 items
APPLICATOR ARISTA FLEXITIP XL (MISCELLANEOUS) IMPLANT
BARRIER ADHS 3X4 INTERCEED (GAUZE/BANDAGES/DRESSINGS) IMPLANT
COVER BACK TABLE 60X90IN (DRAPES) ×2 IMPLANT
COVER MAYO STAND STRL (DRAPES) ×2 IMPLANT
COVER TIP SHEARS 8 DVNC (MISCELLANEOUS) ×2 IMPLANT
DEFOGGER SCOPE WARM SEASHARP (MISCELLANEOUS) ×2 IMPLANT
DERMABOND ADVANCED .7 DNX12 (GAUZE/BANDAGES/DRESSINGS) ×2 IMPLANT
DRAPE ARM DVNC X/XI (DISPOSABLE) ×8 IMPLANT
DRAPE COLUMN DVNC XI (DISPOSABLE) ×2 IMPLANT
DRAPE SURG IRRIG POUCH 19X23 (DRAPES) ×2 IMPLANT
DRAPE UTILITY XL STRL (DRAPES) ×2 IMPLANT
DRIVER NDL MEGA SUTCUT DVNCXI (INSTRUMENTS) ×2 IMPLANT
DRIVER NDLE MEGA SUTCUT DVNCXI (INSTRUMENTS) ×2 IMPLANT
DURAPREP 26ML APPLICATOR (WOUND CARE) ×2 IMPLANT
ELECTRODE REM PT RTRN 9FT ADLT (ELECTROSURGICAL) ×2 IMPLANT
FORCEPS PROGRASP DVNC XI (FORCEP) ×2 IMPLANT
GAUZE 4X4 16PLY ~~LOC~~+RFID DBL (SPONGE) IMPLANT
GLOVE BIOGEL PI IND STRL 7.0 (GLOVE) ×4 IMPLANT
GLOVE INDICATOR 7.5 STRL GRN (GLOVE) IMPLANT
GLOVE NEODERM STER SZ 7 (GLOVE) ×6 IMPLANT
GOWN STRL REUS W/TWL LRG LVL3 (GOWN DISPOSABLE) ×2 IMPLANT
HEMOSTAT ARISTA ABSORB 3G PWDR (HEMOSTASIS) IMPLANT
HOLDER FOLEY CATH W/STRAP (MISCELLANEOUS) IMPLANT
IRRIGATION SUCT STRKRFLW 2 WTP (MISCELLANEOUS) ×2 IMPLANT
KIT PINK PAD W/HEAD ARE REST (MISCELLANEOUS) ×2 IMPLANT
KIT PINK PAD W/HEAD ARM REST (MISCELLANEOUS) ×2 IMPLANT
KIT TURNOVER KIT B (KITS) ×2 IMPLANT
LEGGING LITHOTOMY PAIR STRL (DRAPES) ×2 IMPLANT
MANIFOLD NEPTUNE II (INSTRUMENTS) ×2 IMPLANT
NS IRRIG 1000ML POUR BTL (IV SOLUTION) ×2 IMPLANT
OBTURATOR OPTICALSTD 8 DVNC (TROCAR) ×2 IMPLANT
OCCLUDER COLPOPNEUMO (BALLOONS) IMPLANT
PACK CYSTO (CUSTOM PROCEDURE TRAY) ×2 IMPLANT
PACK ROBOT WH (CUSTOM PROCEDURE TRAY) ×2 IMPLANT
PACK ROBOTIC GOWN (GOWN DISPOSABLE) ×2 IMPLANT
PAD OB MATERNITY 11 LF (PERSONAL CARE ITEMS) ×2 IMPLANT
RUMI II 3.0CM BLUE KOH-EFFICIE (DISPOSABLE) IMPLANT
RUMI II GYRUS 2.5CM BLUE (DISPOSABLE) IMPLANT
RUMI II GYRUS 3.5CM BLUE (DISPOSABLE) IMPLANT
RUMI II GYRUS 4.0CM BLUE (DISPOSABLE) IMPLANT
SCISSORS MNPLR CVD DVNC XI (INSTRUMENTS) ×2 IMPLANT
SEAL UNIV 5-12 XI (MISCELLANEOUS) ×6 IMPLANT
SEALER VESSEL EXT DVNC XI (MISCELLANEOUS) IMPLANT
SET CYSTO W/LG BORE CLAMP LF (SET/KITS/TRAYS/PACK) ×2 IMPLANT
SET TUBE SMOKE EVAC HIGH FLOW (TUBING) ×2 IMPLANT
SLEEVE SCD COMPRESS KNEE MED (STOCKING) ×2 IMPLANT
SPIKE FLUID TRANSFER (MISCELLANEOUS) ×2 IMPLANT
SUT MNCRL AB 4-0 PS2 18 (SUTURE) ×2 IMPLANT
SUT VLOC 180 0 9IN GS21 (SUTURE) ×4 IMPLANT
TIP RUMI ORANGE 6.7MMX12CM (TIP) IMPLANT
TIP UTERINE 6.7X10CM GRN DISP (MISCELLANEOUS) IMPLANT
TIP UTERINE 6.7X8CM BLUE DISP (MISCELLANEOUS) IMPLANT
TOWEL GREEN STERILE (TOWEL DISPOSABLE) ×2 IMPLANT
TRAY FOLEY W/BAG SLVR 14FR (SET/KITS/TRAYS/PACK) ×2 IMPLANT
UNDERPAD 30X36 HEAVY ABSORB (UNDERPADS AND DIAPERS) ×2 IMPLANT

## 2024-06-01 NOTE — Interval H&P Note (Signed)
 History and Physical Interval Note:  06/01/2024 2:21 PM  Linda Carlson  has presented today for surgery, with the diagnosis of Hx of abnormal pap smear, DUB, dysmenorrhea, Hx of cesarean section, iron deficiency anemia due to chronic blood loss, menorrhagia with irregular cycle, adenomyosis, Hx of tubal ligation.  The various methods of treatment have been discussed with the patient and family. After consideration of risks, benefits and other options for treatment, the patient has consented to  Procedure(s): HYSTERECTOMY, TOTAL, LAPAROSCOPIC, ROBOT-ASSISTED WITH SALPINGECTOMY (Bilateral) CYSTOSCOPY (N/A) as a surgical intervention.  The patient's history has been reviewed, patient examined, no change in status, stable for surgery.  I have reviewed the patient's chart and labs.  Questions were answered to the patient's satisfaction.     Reinaldo Caras

## 2024-06-01 NOTE — OR Nursing (Signed)
 17:07 cbg 214.

## 2024-06-01 NOTE — Anesthesia Procedure Notes (Signed)
 Procedure Name: Intubation Date/Time: 06/01/2024 3:17 PM  Performed by: Gabe Jock, CRNAPre-anesthesia Checklist: Patient identified, Emergency Drugs available, Suction available and Patient being monitored Patient Re-evaluated:Patient Re-evaluated prior to induction Oxygen Delivery Method: Circle System Utilized Preoxygenation: Pre-oxygenation with 100% oxygen Induction Type: IV induction Ventilation: Mask ventilation without difficulty Laryngoscope Size: Mac and 3 Grade View: Grade I Tube type: Oral Tube size: 7.0 mm Number of attempts: 1 Airway Equipment and Method: Stylet Placement Confirmation: ETT inserted through vocal cords under direct vision, positive ETCO2 and breath sounds checked- equal and bilateral Secured at: 22 cm Tube secured with: Tape Dental Injury: Teeth and Oropharynx as per pre-operative assessment

## 2024-06-01 NOTE — OR Nursing (Signed)
 Care of patient turned over to [facility]

## 2024-06-01 NOTE — Transfer of Care (Signed)
 Immediate Anesthesia Transfer of Care Note  Patient: Linda Carlson  Procedure(s) Performed: HYSTERECTOMY, TOTAL, LAPAROSCOPIC, ROBOT-ASSISTED WITH SALPINGECTOMY (Bilateral: Abdomen) CYSTOSCOPY (Bladder)  Patient Location: PACU  Anesthesia Type:General  Level of Consciousness: drowsy  Airway & Oxygen Therapy: Patient Spontanous Breathing and Patient connected to nasal cannula oxygen  Post-op Assessment: Report given to RN and Post -op Vital signs reviewed and stable  Post vital signs: Reviewed and stable  Last Vitals:  Vitals Value Taken Time  BP 147/103 06/01/24 1700  Temp 36.7 C 06/01/24 1658  Pulse 91 06/01/24 1703  Resp 11 06/01/24 1703  SpO2 96 % 06/01/24 1703  Vitals shown include unfiled device data.  Last Pain:  Vitals:   06/01/24 1322  TempSrc: Oral  PainSc: 0-No pain      Patients Stated Pain Goal: 7 (06/01/24 1322)  Complications: No notable events documented.

## 2024-06-01 NOTE — Anesthesia Preprocedure Evaluation (Addendum)
 Anesthesia Evaluation  Patient identified by MRN, date of birth, ID band Patient awake    Reviewed: Allergy & Precautions, NPO status , Patient's Chart, lab work & pertinent test results  Airway Mallampati: I  TM Distance: >3 FB Neck ROM: Full    Dental  (+) Teeth Intact, Dental Advisory Given   Pulmonary neg pulmonary ROS   breath sounds clear to auscultation       Cardiovascular negative cardio ROS  Rhythm:Regular Rate:Normal     Neuro/Psych  PSYCHIATRIC DISORDERS      negative neurological ROS     GI/Hepatic negative GI ROS, Neg liver ROS,,,  Endo/Other  diabetes, Type 2, Oral Hypoglycemic Agents    Renal/GU negative Renal ROS     Musculoskeletal negative musculoskeletal ROS (+)    Abdominal   Peds  Hematology  (+) Blood dyscrasia, anemia   Anesthesia Other Findings   Reproductive/Obstetrics                             Anesthesia Physical Anesthesia Plan  ASA: 2  Anesthesia Plan: General   Post-op Pain Management: Tylenol  PO (pre-op)* and Toradol  IV (intra-op)*   Induction: Intravenous  PONV Risk Score and Plan: 4 or greater and Ondansetron , Dexamethasone, Midazolam and Scopolamine  patch - Pre-op  Airway Management Planned: Oral ETT  Additional Equipment: None  Intra-op Plan:   Post-operative Plan: Extubation in OR  Informed Consent: I have reviewed the patients History and Physical, chart, labs and discussed the procedure including the risks, benefits and alternatives for the proposed anesthesia with the patient or authorized representative who has indicated his/her understanding and acceptance.     Dental advisory given  Plan Discussed with: CRNA  Anesthesia Plan Comments:        Anesthesia Quick Evaluation

## 2024-06-01 NOTE — Op Note (Signed)
 06/01/2024  [id number] Linda Carlson        OPERATIVE REPORT   Preop Diagnosis: menorrhagia, dysmenorrhea, anemia, likely adenomyosis, h/o ltcs x2 and tubal ligation Procedure: robotic hysterectomy, bilateral salpingectomy, cystoscopy, lysis of adhesion s   Surgeon: Dr. Arvell Birchwood [place] Assistant: Santana Cue, RN   Fluids: please see anesthesia report   Complications: None Anesthesia: General     Findings:  boggy contour 10cm uterus, normal ovaries evidence of tubal ligation, omentum attached to anterior abdominal wall Cystoscopy at the end of the case with normal bladder and patent ureters bilaterally.   Estimated blood loss: Minimal <15cc   Specimens: Uterus, cervix and bilateral remnants of fallopian tubes   Disposition of specimen: Pathology           Patient is taken to the operating room. She is placed in the supine position. She is a running IV in place. Informed consent was present on the chart. SCDs on her lower extremities and functioning properly. Patient was positioned while she was awake.  Her legs were placed in the low lithotomy position in [place] stirrups. Her arms were tucked by the side.  General endotracheal anesthesia was administered by the anesthesia staff without difficulty.       Dura prep was then used to prep the abdomen and Hibiclens was used to prep the inner thighs, perineum and vagina. Once 3 minutes had past the patient was draped in a normal standard fashion. A proper time out was performed and everyone agreed.  The legs were lifted to the high lithotomy position. A bivalve speculum was inserted into the vagina and the anterior lip of the cervix was grasped with single-tooth tenaculum.  The uterus sounded to 10 cm. Pratt dilators were used to dilate the cervix.  The RUMI uterine manipulator was obtained inserted into the endometrial cavity and the bulb of the disposable tip was inflated with 8 cc of normal saline. There was a good fit  of the KOH ring around the cervix. The tenaculum and bivavle speculum was removed. There is also good manipulation of the uterus.  A Foley catheter was placed to straight drain.  Clear urine was noted. Legs were lowered to the low lithotomy position and attention was turned the abdomen.   Superior to the umbilicus, marcaine 0.25% used to anesthetize the skin.  Using #11 blade, 8mm skin incision was made.  The 8mm robotic trocar and sleeve was inserted under direct visualization.  CO2 gas was  started and patient was placed in trendelenburg position.  Two additional 8mm ports were placed under direct visualization in the left and right lower quadrant.     Ureters were identifies. The omental adhesion was brought down from the anterior abdominal wall and there was no bowel involvement.  Good hemostasis was noted.    Attention was turned to the left side. The remaining left tube was elevated and the mesosalpinx was desiccated with the vessel sealer.  The left uterine ovarian pedicle was serially clamped cauterized and incised. Left round ligament was serially clamped cauterized and incised. The anterior and posterior peritoneum of the inferior leaf of the broad ligament were opened. The beginning of the bladder flap was created.  The bladder was taken down below the level of the KOH ring. The left uterine artery skeletonized and then just superior to the KOH ring this vessel was serially clamped, cauterized, and incised.   Attention was turned the right side.  The uterus was placed on stretch to the opposite  side.    The mesosalpinx was incised freeing the tube. Then the right uterine ovarian pedicle was serially clamped cauterized and incised. Next the right round ligament was serially clamped cauterized and incised. The anterior posterior peritoneum of the inferiorly for the broad ligament were opened. The anterior peritoneum was carried across to the dissection on the left side. The remainder of the bladder  flap was created using sharp dissection. The bladder was well below the level of the KOH ring. The right uterine artery skeletonized. Then the right uterine artery, above the level of the KOH ring, was serially clamped cauterized and incised. The uterus was devascularized at this point.   The colpotomy was performed.  This was carried around a circumferential fashion until the vaginal mucosa was completely incised in the specimen was freed.  The specimen was then delivered to the vagina intact.  A vaginal occlusive device was used to maintain the pneumoperitoneum   Instruments were changed with a needle driver and prograsp.  Using a 9 inch  zero V-lock suture, the cuff was closed by incorporating the anterior and posterior vaginal mucosa in each stitch. This was carried across all the way to the left corner and a running fashion. Two stitches were brought back towards the midline and the suture was cut flush with the vagina. The needle was brought out the pelvis. The pelvis was irrigated. All pedicles were inspected. No bleeding was noted.   Co2 pressures were lowered to 8mm Hg.  Again, no bleeding was noted.  Ureters were noted deep in the pelvis to be peristalsing.  At this point the procedure was completed.  The remaining instruments were removed.  The ports were removed under direct visualization of the laparoscope and the pneumoperitoneum was relieved.   The skin was then closed with subcuticular stitches of 3-0 Vicryl. The skin was cleansed Dermabond was applied. Attention was then turned the vagina and the cuff was inspected. No bleeding was noted.  The Foley catheter was removed.  Cystoscopy was performed.  No sutures or bladder injuries were noted.  Ureters were noted with normal urine jets from each one was seen.  Foley was left out after the cystoscopic fluid was drained and cystoscope removed.  Sponge, lap, needle, instrument counts were correct x2. Patient tolerated the procedure very well. She  was awakened from anesthesia, extubated and taken to recovery in stable condition.      Dr. Tia Flowers

## 2024-06-02 ENCOUNTER — Telehealth: Payer: Self-pay

## 2024-06-02 ENCOUNTER — Encounter (HOSPITAL_COMMUNITY): Payer: Self-pay | Admitting: Obstetrics and Gynecology

## 2024-06-02 LAB — GLUCOSE, CAPILLARY: Glucose-Capillary: 214 mg/dL — ABNORMAL HIGH (ref 70–99)

## 2024-06-02 NOTE — Anesthesia Postprocedure Evaluation (Signed)
 Anesthesia Post Note  Patient: [name]  Procedure(s) Performed: HYSTERECTOMY, TOTAL, LAPAROSCOPIC, ROBOT-ASSISTED WITH SALPINGECTOMY (Bilateral: Abdomen) CYSTOSCOPY (Bladder)     Patient location during evaluation: PACU Anesthesia Type: General Level of consciousness: sedated and patient cooperative Pain management: pain level controlled Vital Signs Assessment: post-procedure vital signs reviewed and stable Respiratory status: spontaneous breathing Cardiovascular status: stable Anesthetic complications: no   No notable events documented.  Last Vitals:  Vitals:   06/01/24 2029 06/01/24 2030  BP: (!) 144/90 (!) 144/90  Pulse: (!) 116 (!) 115  Resp: 16 15  Temp:  36.7 C  SpO2: 98% 99%    Last Pain:  Vitals:   06/01/24 1915  TempSrc:   PainSc: Asleep                 Gorman Laughter

## 2024-06-02 NOTE — Telephone Encounter (Signed)
 Per Dr Crissie Dome, I had pt added to schedule on 06/06/24 at 2:00.   Left message on vm, per dpr, relaying info above and asked pt to check in by 1:45-1:50 at front desk.

## 2024-06-06 ENCOUNTER — Ambulatory Visit (INDEPENDENT_AMBULATORY_CARE_PROVIDER_SITE_OTHER): Admitting: Family Medicine

## 2024-06-06 ENCOUNTER — Encounter: Payer: Self-pay | Admitting: Family Medicine

## 2024-06-06 ENCOUNTER — Telehealth: Payer: Self-pay

## 2024-06-06 ENCOUNTER — Ambulatory Visit: Payer: Self-pay | Admitting: Obstetrics and Gynecology

## 2024-06-06 VITALS — BP 134/80 | HR 83 | Temp 98.2°F | Ht 64.25 in | Wt 164.0 lb

## 2024-06-06 DIAGNOSIS — E8940 Asymptomatic postprocedural ovarian failure: Secondary | ICD-10-CM

## 2024-06-06 DIAGNOSIS — E1169 Type 2 diabetes mellitus with other specified complication: Secondary | ICD-10-CM

## 2024-06-06 LAB — SURGICAL PATHOLOGY

## 2024-06-06 MED ORDER — INSULIN PEN NEEDLE 30G X 5 MM MISC: 3 refills | Status: DC

## 2024-06-06 MED ORDER — LANTUS SOLOSTAR 100 UNIT/ML ~~LOC~~ SOPN: 10.0000 [IU] | PEN_INJECTOR | Freq: Every day | SUBCUTANEOUS | 1 refills | Status: DC

## 2024-06-06 NOTE — Telephone Encounter (Signed)
 Initiated PA for mounjaro  received today for Mounjaro . However, rx has been discontinued by pt's PCP.  Key: [id number]

## 2024-06-06 NOTE — Patient Instructions (Addendum)
 Stop metaglip , restart plain metformin  500mg  twice daily.  Go ahead and start lantus 10 units daily. Titrate lantus insulin  by 2 units every 2-3 days if average fasting sugar staying >150.  Ask for diabetic eye exam next time you see eye doctor Return in 4-6 weeks for diabetes follow up visit.   Current goal fasting sugar <150  Ideal goal fasting sugar 80-120  Goal sugar 2 hours after meal <180.  Too low if <70 - let us  know if this happens.

## 2024-06-06 NOTE — Progress Notes (Unsigned)
 Ph: [phone] Fax: [phone]   Patient ID: Linda Carlson, female    DOB: [birth year], 43 y.o.   MRN: [id number]  This visit was conducted in person.  BP 134/80   Pulse 83   Temp 98.2 F (36.8 C) (Oral)   Ht 5' 4.25" (1.632 m)   Wt 164 lb (74.4 kg)   LMP 05/11/2024 (Approximate)   SpO2 99%   BMI 27.93 kg/m    CC: diabetes follow up visit  Subjective:   HPI: Linda Carlson is a 42 y.o. female presenting on 06/06/2024 for Re-establish Care (Here for re-establish care and DM f/u.)   Last seen in office 06/2020. A1c at that time 10%.  Recent hysterectomy and BSO 06/01/2024 for dysmenorrhea with heavy bleeding. Endometrial biopsy prior showed benign proliferative endometrium, pap showed ASCUS/+HRHPV. Uterine/cervical pathology showed focal LSIL/CIN1, chronic endocervicitis, negative for malignancy.   DM - first diagnosed 2021, lost to f/u. Does regularly check sugars BID - 300s. Compliant with antihyperglycemic regimen which includes: metaglip  2.5/500mg  bid. Denies low sugars or hypoglycemic symptoms. Denies paresthesias, blurry vision. Last diabetic eye exam upcoming appt. Glucometer brand: [employer] off [employer]. Last foot exam: today. DSME: will refer. Both parents have type 2 diabetes. H/o gestational diabetes - went through diabetes classes at that time (2011). She has cut out sweetened beverages, drinking good water and gatorade zero. She's started using Allulose artificial sweetener.  Lab Results  Component Value Date   HGBA1C 12.0 (H) 04/28/2024   Diabetic Foot Exam - Simple   Simple Foot Form Diabetic Foot exam was performed with the following findings: Yes 06/06/2024  2:40 PM  Visual Inspection See comments: Yes Sensation Testing Intact to touch and monofilament testing bilaterally: Yes Pulse Check Posterior Tibialis and Dorsalis pulse intact bilaterally: Yes Comments No claudication Callus to left distal sole between 1st and 2nd toes    No results found for:  "MICROALBUR", "MALB24HUR"      Relevant past medical, surgical, family and social history reviewed and updated as indicated. Interim medical history since our last visit reviewed. Allergies and medications reviewed and updated. Outpatient Medications Prior to Visit  Medication Sig Dispense Refill   acetaminophen  (TYLENOL ) 500 MG tablet Take 500 mg by mouth every 6 (six) hours as needed for moderate pain (pain score 4-6).     fluticasone  (FLONASE ) 50 MCG/ACT nasal spray Place 1 spray into both nostrils daily. 15.8 mL 0   glipiZIDE -metformin  (METAGLIP ) 2.5-500 MG tablet Take 1 tablet by mouth 2 (two) times daily before a meal. 60 tablet 3   ibuprofen  (ADVIL ) 200 MG tablet Take 400 mg by mouth every 6 (six) hours as needed.     ibuprofen  (ADVIL ) 800 MG tablet Take 1 tablet (800 mg total) by mouth every 8 (eight) hours as needed. 30 tablet 1   metFORMIN  (GLUCOPHAGE ) 500 MG tablet Take 1 tablet by mouth 2 (two) times daily.     metoCLOPramide  (REGLAN ) 10 MG tablet Take 1 tablet (10 mg total) by mouth every 8 (eight) hours as needed for nausea. 10 tablet 0   Multiple Vitamin (MULTIVITAMIN) tablet Take 1 tablet by mouth daily.     oxyCODONE  (ROXICODONE ) 5 MG immediate release tablet Take 1 tablet (5 mg total) by mouth every 4 (four) hours as needed for severe pain (pain score 7-10). (Patient not taking: Reported on 05/02/2024) 5 tablet 0   tirzepatide  (MOUNJARO ) 2.5 MG/0.5ML Pen Inject 2.5 mg into the skin once a week. (Patient not taking: Reported on 06/06/2024) 4 mL  0   No facility-administered medications prior to visit.     Per HPI unless specifically indicated in ROS section below Review of Systems  Objective:  BP 134/80   Pulse 83   Temp 98.2 F (36.8 C) (Oral)   Ht 5' 4.25" (1.632 m)   Wt 164 lb (74.4 kg)   LMP 05/11/2024 (Approximate)   SpO2 99%   BMI 27.93 kg/m   Wt Readings from Last 3 Encounters:  06/06/24 164 lb (74.4 kg)  06/01/24 160 lb (72.6 kg)  05/02/24 160 lb (72.6 kg)       Physical Exam Vitals and nursing note reviewed.  Constitutional:      Appearance: Normal appearance. She is not ill-appearing.  Eyes:     Extraocular Movements: Extraocular movements intact.     Conjunctiva/sclera: Conjunctivae normal.     Pupils: Pupils are equal, round, and reactive to light.  Cardiovascular:     Rate and Rhythm: Normal rate and regular rhythm.     Pulses: Normal pulses.     Heart sounds: Normal heart sounds. No murmur heard. Pulmonary:     Effort: Pulmonary effort is normal. No respiratory distress.     Breath sounds: Normal breath sounds. No wheezing, rhonchi or rales.  Musculoskeletal:     Right lower leg: No edema.     Left lower leg: No edema.  Skin:    General: Skin is warm and dry.     Findings: No rash.  Neurological:     Mental Status: She is alert.  Psychiatric:        Mood and Affect: Mood normal.        Behavior: Behavior normal.        Assessment & Plan:   Problem List Items Addressed This Visit     Type 2 diabetes mellitus with other specified complication (HCC) - Primary   Reviewed pathophysiology of diabetes as well as management strategies. Diabetes self care checklist handout provided as well as diabetes and nutrition handout.  Reviewed fasting and postprandial glucose goals.  Discussed CGM - she declines at this time.  Recommend low sugar low carb diabetic diet. Refer to diabetes education classes in [place].  Reviewed treatment options - start lantus basal insulin  10u with titration up as tolerated as per instructions. Continue metformin  500mg  BID.  RTC 4-6 wks for DM f/u visit.       Relevant Medications   insulin  glargine (LANTUS SOLOSTAR) 100 UNIT/ML Solostar Pen   Other Relevant Orders   Ambulatory referral to diabetic education   Surgical menopause   S/p hysterectomy and BSO for dysmenorrhea with heavy bleeding 05/2024 (Dr Tia Flowers)        Meds ordered this encounter  Medications   insulin  glargine (LANTUS  SOLOSTAR) 100 UNIT/ML Solostar Pen    Sig: Inject 10 Units into the skin daily.    Dispense:  15 mL    Refill:  1   Insulin  Pen Needle 30G X 5 MM MISC    Sig: Fill needles for Lantus Solostar Pens    Dispense:  100 each    Refill:  3    Orders Placed This Encounter  Procedures   Ambulatory referral to diabetic education    Referral Priority:   Routine    Referral Type:   Consultation    Referral Reason:   Specialty Services Required    Number of Visits Requested:   1    Patient Instructions  Stop metaglip , restart plain metformin  500mg  twice daily.  Go ahead and start lantus 10 units daily. Titrate lantus insulin  by 2 units every 2-3 days if average fasting sugar staying >150.  Ask for diabetic eye exam next time you see eye doctor Return in 4-6 weeks for diabetes follow up visit.   Current goal fasting sugar <150  Ideal goal fasting sugar 80-120  Goal sugar 2 hours after meal <180.  Too low if <70 - let us  know if this happens.   Follow up plan: Return in about 6 weeks (around 07/18/2024) for follow up visit.  Claire Crick, MD

## 2024-06-07 ENCOUNTER — Encounter: Payer: Self-pay | Admitting: Family Medicine

## 2024-06-07 DIAGNOSIS — E8940 Asymptomatic postprocedural ovarian failure: Secondary | ICD-10-CM | POA: Insufficient documentation

## 2024-06-07 NOTE — Assessment & Plan Note (Addendum)
 Reviewed pathophysiology of diabetes as well as management strategies. Diabetes self care checklist handout provided as well as diabetes and nutrition handout.  Reviewed fasting and postprandial glucose goals.  Discussed CGM - she declines at this time.  Recommend low sugar low carb diabetic diet. Refer to diabetes education classes in [place].  Reviewed treatment options - start lantus basal insulin  10u with titration up as tolerated as per instructions. Continue metformin  500mg  BID.  RTC 4-6 wks for DM f/u visit.

## 2024-06-07 NOTE — Assessment & Plan Note (Signed)
 S/p hysterectomy and BSO for dysmenorrhea with heavy bleeding 05/2024 (Dr Tia Flowers)

## 2024-06-15 ENCOUNTER — Ambulatory Visit (INDEPENDENT_AMBULATORY_CARE_PROVIDER_SITE_OTHER): Admitting: Obstetrics and Gynecology

## 2024-06-15 VITALS — BP 116/74 | HR 94 | Wt 163.2 lb

## 2024-06-15 DIAGNOSIS — Z9889 Other specified postprocedural states: Secondary | ICD-10-CM

## 2024-06-15 NOTE — Progress Notes (Signed)
 Patient presents for 2 week postop from [facility], bilateral salpingectomy, cystoscopy. She is doing well. No fevers, VB, dysuria or severe abdominal pain. On insulin  now. States fsbs in 160-260's Following up with her PMD soon. Feels great. She is glad she had the procedure.  BP 116/74 (BP Location: Left Arm, Patient Position: Sitting, Cuff Size: Large)   Pulse 94   Wt 163 lb 3.2 oz (74 kg)   LMP 05/11/2024 (Approximate)   SpO2 99%   BMI 27.80 kg/m   Abdomen: incisions I/c/d, NT, ND  A/p PO from [facility] 2 weeks doing well Encouraged no heavy lifting, pushing, pulling greater than 10 lbs for full 8 weeks 2. Pelvic rest for the entire 10 wks 3. RTC with any concerns or with heavy bleeding, fevers or severe abdominal pain. 4.  Encouraged eye exam due to the risk with diabetes Dr. Tia Flowers

## 2024-07-07 ENCOUNTER — Encounter: Admitting: Obstetrics and Gynecology

## 2024-07-18 ENCOUNTER — Ambulatory Visit: Admitting: Family Medicine

## 2024-07-27 ENCOUNTER — Ambulatory Visit (INDEPENDENT_AMBULATORY_CARE_PROVIDER_SITE_OTHER): Admitting: Obstetrics and Gynecology

## 2024-07-27 ENCOUNTER — Other Ambulatory Visit (HOSPITAL_COMMUNITY): Payer: Self-pay

## 2024-07-27 ENCOUNTER — Other Ambulatory Visit: Payer: Self-pay

## 2024-07-27 ENCOUNTER — Encounter: Payer: Self-pay | Admitting: Obstetrics and Gynecology

## 2024-07-27 ENCOUNTER — Encounter (HOSPITAL_COMMUNITY): Payer: Self-pay

## 2024-07-27 ENCOUNTER — Telehealth (HOSPITAL_COMMUNITY): Payer: Self-pay | Admitting: Pharmacy Technician

## 2024-07-27 ENCOUNTER — Inpatient Hospital Stay (HOSPITAL_COMMUNITY)
Admission: AD | Admit: 2024-07-27 | Discharge: 2024-08-01 | DRG: 908 | Disposition: A | Discharge status: Home / Self Care | Attending: Obstetrics and Gynecology | Admitting: Obstetrics and Gynecology

## 2024-07-27 ENCOUNTER — Telehealth: Admitting: Family Medicine

## 2024-07-27 ENCOUNTER — Encounter (HOSPITAL_COMMUNITY): Payer: Self-pay | Admitting: Obstetrics and Gynecology

## 2024-07-27 ENCOUNTER — Inpatient Hospital Stay (HOSPITAL_COMMUNITY)

## 2024-07-27 VITALS — BP 136/82 | HR 113 | Temp 98.5°F | Wt 160.0 lb

## 2024-07-27 DIAGNOSIS — Z794 Long term (current) use of insulin: Secondary | ICD-10-CM | POA: Diagnosis not present

## 2024-07-27 DIAGNOSIS — Z833 Family history of diabetes mellitus: Secondary | ICD-10-CM

## 2024-07-27 DIAGNOSIS — Z87410 Personal history of cervical dysplasia: Secondary | ICD-10-CM

## 2024-07-27 DIAGNOSIS — B999 Unspecified infectious disease: Secondary | ICD-10-CM | POA: Diagnosis not present

## 2024-07-27 DIAGNOSIS — G8918 Other acute postprocedural pain: Secondary | ICD-10-CM

## 2024-07-27 DIAGNOSIS — E1165 Type 2 diabetes mellitus with hyperglycemia: Secondary | ICD-10-CM | POA: Diagnosis present

## 2024-07-27 DIAGNOSIS — Z9889 Other specified postprocedural states: Secondary | ICD-10-CM

## 2024-07-27 DIAGNOSIS — N760 Acute vaginitis: Secondary | ICD-10-CM | POA: Diagnosis present

## 2024-07-27 DIAGNOSIS — T8131XA Disruption of external operation (surgical) wound, not elsewhere classified, initial encounter: Principal | ICD-10-CM | POA: Diagnosis present

## 2024-07-27 DIAGNOSIS — N390 Urinary tract infection, site not specified: Secondary | ICD-10-CM | POA: Diagnosis present

## 2024-07-27 DIAGNOSIS — N939 Abnormal uterine and vaginal bleeding, unspecified: Secondary | ICD-10-CM | POA: Diagnosis not present

## 2024-07-27 DIAGNOSIS — N898 Other specified noninflammatory disorders of vagina: Secondary | ICD-10-CM | POA: Diagnosis not present

## 2024-07-27 DIAGNOSIS — T8149XA Infection following a procedure, other surgical site, initial encounter: Secondary | ICD-10-CM

## 2024-07-27 DIAGNOSIS — T81328A Disruption or dehiscence of closure of other specified internal operation (surgical) wound, initial encounter: Secondary | ICD-10-CM | POA: Diagnosis present

## 2024-07-27 DIAGNOSIS — N941 Unspecified dyspareunia: Secondary | ICD-10-CM

## 2024-07-27 DIAGNOSIS — R109 Unspecified abdominal pain: Principal | ICD-10-CM | POA: Diagnosis present

## 2024-07-27 DIAGNOSIS — Z7984 Long term (current) use of oral hypoglycemic drugs: Secondary | ICD-10-CM

## 2024-07-27 DIAGNOSIS — B3731 Acute candidiasis of vulva and vagina: Secondary | ICD-10-CM | POA: Diagnosis present

## 2024-07-27 DIAGNOSIS — R1030 Lower abdominal pain, unspecified: Secondary | ICD-10-CM

## 2024-07-27 DIAGNOSIS — E1169 Type 2 diabetes mellitus with other specified complication: Secondary | ICD-10-CM | POA: Diagnosis not present

## 2024-07-27 DIAGNOSIS — R110 Nausea: Secondary | ICD-10-CM

## 2024-07-27 DIAGNOSIS — B9620 Unspecified Escherichia coli [E. coli] as the cause of diseases classified elsewhere: Secondary | ICD-10-CM | POA: Diagnosis present

## 2024-07-27 DIAGNOSIS — Z8249 Family history of ischemic heart disease and other diseases of the circulatory system: Secondary | ICD-10-CM | POA: Diagnosis not present

## 2024-07-27 LAB — URINALYSIS, W/ REFLEX TO CULTURE (INFECTION SUSPECTED)
Bilirubin Urine: NEGATIVE
Glucose, UA: >=500 mg/dL — AB
Ketones, ur: NEGATIVE mg/dL
Nitrite: NEGATIVE
Protein, ur: NEGATIVE mg/dL
Specific Gravity, Urine: 1.031 — ABNORMAL HIGH (ref 1.005–1.030)
WBC, UA: >50 WBC/hpf (ref 0–5)
pH: 5 (ref 5.0–8.0)

## 2024-07-27 LAB — GLUCOSE, CAPILLARY
Glucose-Capillary: 339 mg/dL — ABNORMAL HIGH (ref 70–99)
Glucose-Capillary: 303 mg/dL — ABNORMAL HIGH (ref 70–99)
Glucose-Capillary: 252 mg/dL — ABNORMAL HIGH (ref 70–99)
Glucose-Capillary: 229 mg/dL — ABNORMAL HIGH (ref 70–99)

## 2024-07-27 LAB — CBC
HCT: 35.9 % — ABNORMAL LOW (ref 36.0–46.0)
Hemoglobin: 12.3 g/dL (ref 12.0–15.0)
MCH: 29.6 pg (ref 26.0–34.0)
MCHC: 34.3 g/dL (ref 30.0–36.0)
MCV: 86.5 fL (ref 80.0–100.0)
Platelets: 285 K/uL (ref 150–400)
RBC: 4.15 MIL/uL (ref 3.87–5.11)
RDW: 12.3 % (ref 11.5–15.5)
WBC: 12.8 K/uL — ABNORMAL HIGH (ref 4.0–10.5)
nRBC: 0 % (ref 0.0–0.2)

## 2024-07-27 LAB — COMPREHENSIVE METABOLIC PANEL WITH GFR
ALT: 30 U/L (ref 0–44)
AST: 25 U/L (ref 15–41)
Albumin: 3.7 g/dL (ref 3.5–5.0)
Alkaline Phosphatase: 38 U/L (ref 38–126)
Anion gap: 13 (ref 5–15)
BUN: 10 mg/dL (ref 6–20)
CO2: 19 mmol/L — ABNORMAL LOW (ref 22–32)
Calcium: 8.9 mg/dL (ref 8.9–10.3)
Chloride: 104 mmol/L (ref 98–111)
Creatinine, Ser: 0.67 mg/dL (ref 0.44–1.00)
GFR, Estimated: >60 mL/min (ref >60)
Glucose, Bld: 340 mg/dL — ABNORMAL HIGH (ref 70–99)
Potassium: 3.7 mmol/L (ref 3.5–5.1)
Sodium: 136 mmol/L (ref 135–145)
Total Bilirubin: 1 mg/dL (ref 0.0–1.2)
Total Protein: 6.9 g/dL (ref 6.5–8.1)

## 2024-07-27 LAB — HEMOGLOBIN A1C
Hgb A1c MFr Bld: 11.5 % — ABNORMAL HIGH (ref 4.8–5.6)
Mean Plasma Glucose: 283.35 mg/dL

## 2024-07-27 MED ORDER — FLUCONAZOLE 150 MG PO TABS: 150.0000 mg | ORAL_TABLET | Freq: Every day | ORAL | 0 refills | Status: DC

## 2024-07-27 MED ORDER — IBUPROFEN 600 MG PO TABS
600.0000 mg | ORAL_TABLET | Freq: Four times a day (QID) | ORAL | Status: DC
  Administered 2024-07-27 – 2024-07-29 (×7): 600 mg via ORAL
  Filled 2024-07-27 (×7): qty 1

## 2024-07-27 MED ORDER — FAMOTIDINE IN NACL 20-0.9 MG/50ML-% IV SOLN
20.0000 mg | Freq: Two times a day (BID) | INTRAVENOUS | Status: DC
  Administered 2024-07-27 – 2024-07-28 (×4): 20 mg via INTRAVENOUS
  Filled 2024-07-27 (×3): qty 50

## 2024-07-27 MED ORDER — METOCLOPRAMIDE HCL 5 MG/ML IJ SOLN
10.0000 mg | Freq: Four times a day (QID) | INTRAMUSCULAR | Status: DC
  Administered 2024-07-27 – 2024-07-29 (×5): 10 mg via INTRAVENOUS
  Filled 2024-07-27 (×5): qty 2

## 2024-07-27 MED ORDER — BIOGAIA PROBIOTIC PO LIQD: 5.0000 [drp] | Freq: Every day | ORAL | Status: DC

## 2024-07-27 MED ORDER — PIPERACILLIN-TAZOBACTAM 3.375 G IVPB 30 MIN: 3.3750 g | Freq: Four times a day (QID) | INTRAVENOUS | Status: DC

## 2024-07-27 MED ORDER — KETOROLAC TROMETHAMINE 15 MG/ML IJ SOLN: 15.0000 mg | Freq: Once | INTRAMUSCULAR | Status: DC

## 2024-07-27 MED ORDER — IOHEXOL 350 MG/ML SOLN
75.0000 mL | Freq: Once | INTRAVENOUS | Status: AC | PRN
  Administered 2024-07-27: 75 mL via INTRAVENOUS

## 2024-07-27 MED ORDER — INSULIN GLARGINE-YFGN 100 UNIT/ML ~~LOC~~ SOLN
10.0000 [IU] | Freq: Every day | SUBCUTANEOUS | Status: DC
  Administered 2024-07-27: 10 [IU] via SUBCUTANEOUS
  Filled 2024-07-27 (×2): qty 0.1

## 2024-07-27 MED ORDER — INSULIN ASPART 100 UNIT/ML IJ SOLN
0.0000 [IU] | Freq: Three times a day (TID) | INTRAMUSCULAR | Status: DC
  Administered 2024-07-28 (×2): 5 [IU] via SUBCUTANEOUS
  Administered 2024-07-29: 2 [IU] via SUBCUTANEOUS
  Administered 2024-07-29: 3 [IU] via SUBCUTANEOUS
  Administered 2024-07-30: 2 [IU] via SUBCUTANEOUS
  Administered 2024-07-31: 5 [IU] via SUBCUTANEOUS
  Administered 2024-07-31 – 2024-08-01 (×3): 3 [IU] via SUBCUTANEOUS

## 2024-07-27 MED ORDER — FLORANEX PO PACK
1.0000 g | PACK | Freq: Three times a day (TID) | ORAL | Status: DC
  Administered 2024-07-31 – 2024-08-01 (×2): 1 g via ORAL
  Filled 2024-07-27 (×15): qty 1

## 2024-07-27 MED ORDER — KETOROLAC TROMETHAMINE 15 MG/ML IJ SOLN
15.0000 mg | Freq: Once | INTRAMUSCULAR | Status: AC
  Administered 2024-07-27: 15 mg via INTRAVENOUS

## 2024-07-27 MED ORDER — INSULIN ASPART 100 UNIT/ML IJ SOLN
0.0000 [IU] | Freq: Every day | INTRAMUSCULAR | Status: DC
  Administered 2024-07-28: 2 [IU] via SUBCUTANEOUS
  Administered 2024-07-28: 3 [IU] via SUBCUTANEOUS
  Administered 2024-07-31: 2 [IU] via SUBCUTANEOUS

## 2024-07-27 MED ORDER — SIMETHICONE 80 MG PO CHEW
80.0000 mg | CHEWABLE_TABLET | Freq: Four times a day (QID) | ORAL | Status: DC | PRN
  Administered 2024-07-27 – 2024-07-28 (×2): 80 mg via ORAL
  Filled 2024-07-27 (×2): qty 1

## 2024-07-27 MED ORDER — OXYCODONE HCL 5 MG PO TABS
5.0000 mg | ORAL_TABLET | ORAL | Status: DC | PRN
  Administered 2024-07-27 – 2024-07-28 (×3): 10 mg via ORAL
  Administered 2024-07-29: 5 mg via ORAL
  Administered 2024-07-29: 10 mg via ORAL
  Administered 2024-07-30 – 2024-08-01 (×3): 5 mg via ORAL
  Filled 2024-07-27 (×2): qty 1
  Filled 2024-07-27: qty 2
  Filled 2024-07-27 (×2): qty 1
  Filled 2024-07-27 (×3): qty 2

## 2024-07-27 MED ORDER — ESTRADIOL 0.1 MG/GM VA CREA
1.0000 | TOPICAL_CREAM | Freq: Every day | VAGINAL | Status: DC
  Administered 2024-07-27: 1 via VAGINAL
  Filled 2024-07-27: qty 42.5

## 2024-07-27 MED ORDER — POLYETHYLENE GLYCOL 3350 17 G PO PACK: 17.0000 g | PACK | Freq: Every day | ORAL | Status: DC | PRN

## 2024-07-27 MED ORDER — ONDANSETRON HCL 4 MG/2ML IJ SOLN
4.0000 mg | Freq: Four times a day (QID) | INTRAMUSCULAR | Status: DC | PRN
  Administered 2024-07-27 – 2024-07-28 (×3): 4 mg via INTRAVENOUS
  Filled 2024-07-27 (×3): qty 2

## 2024-07-27 MED ORDER — DOCUSATE SODIUM 100 MG PO CAPS
100.0000 mg | ORAL_CAPSULE | Freq: Two times a day (BID) | ORAL | Status: DC
  Administered 2024-07-27 – 2024-08-01 (×7): 100 mg via ORAL
  Filled 2024-07-27 (×8): qty 1

## 2024-07-27 MED ORDER — HYDROMORPHONE HCL 1 MG/ML IJ SOLN
0.2000 mg | INTRAMUSCULAR | Status: DC | PRN
  Administered 2024-07-29: 0.4 mg via INTRAVENOUS
  Administered 2024-07-30 (×2): 0.2 mg via INTRAVENOUS
  Filled 2024-07-27 (×4): qty 1

## 2024-07-27 MED ORDER — BISACODYL 10 MG RE SUPP: 10.0000 mg | Freq: Every day | RECTAL | Status: DC | PRN

## 2024-07-27 MED ORDER — GABAPENTIN 100 MG PO CAPS
100.0000 mg | ORAL_CAPSULE | Freq: Once | ORAL | Status: AC
  Administered 2024-07-27: 100 mg via ORAL
  Filled 2024-07-27: qty 1

## 2024-07-27 MED ORDER — PANTOPRAZOLE SODIUM 40 MG IV SOLR
40.0000 mg | Freq: Every day | INTRAVENOUS | Status: DC
  Administered 2024-07-27 – 2024-07-28 (×2): 40 mg via INTRAVENOUS
  Filled 2024-07-27 (×2): qty 10

## 2024-07-27 MED ORDER — LACTATED RINGERS IV SOLN: INTRAVENOUS | Status: DC

## 2024-07-27 MED ORDER — ONDANSETRON HCL 4 MG PO TABS
4.0000 mg | ORAL_TABLET | Freq: Four times a day (QID) | ORAL | Status: DC | PRN
Start: 2024-07-27 — End: 2024-07-29

## 2024-07-27 MED ORDER — PIPERACILLIN-TAZOBACTAM 3.375 G IVPB
3.3750 g | Freq: Three times a day (TID) | INTRAVENOUS | Status: DC
  Administered 2024-07-27 – 2024-07-29 (×6): 3.375 g via INTRAVENOUS
  Filled 2024-07-27 (×6): qty 50

## 2024-07-27 MED ORDER — CALCIUM CARBONATE ANTACID 500 MG PO CHEW
2.0000 | CHEWABLE_TABLET | Freq: Four times a day (QID) | ORAL | Status: DC
  Administered 2024-07-27 – 2024-08-01 (×11): 400 mg via ORAL
  Filled 2024-07-27 (×12): qty 2

## 2024-07-27 MED ORDER — INSULIN ASPART 100 UNIT/ML IJ SOLN: 5.0000 [IU] | Freq: Three times a day (TID) | INTRAMUSCULAR | Status: DC

## 2024-07-27 MED ORDER — ACETAMINOPHEN 325 MG PO TABS
650.0000 mg | ORAL_TABLET | ORAL | Status: DC | PRN
Start: 2024-07-27 — End: 2024-08-01
  Administered 2024-07-30: 650 mg via ORAL
  Filled 2024-07-27: qty 2

## 2024-07-27 NOTE — Telephone Encounter (Signed)
 Call placed to patient, left message advising OV recommended ASAP, return call to Dinna Severs at [phone] to schedule.   Patient returned call. Patient reports spotting this morning with pain in lower abdomen 8 out of 10. Nausea and vomiting. Reports pain and bleeding started after intercourse last night. S/p RLH on 06/01/24. Advised patient to come on to office now to be worked into schedule, patient states she is coming from [facility], states will take her 2 hrs. Added to schedule for 1030, patient aware to come earlier if she can. Recommended patient to bring a driver or support person in the event she may need to return to OR. Advised I will provide update to Dr. Glennon and return call if any additional recommendations. Patient agreeable.   Routing to Dr. Glennon.

## 2024-07-27 NOTE — Telephone Encounter (Signed)
 Pharmacy Patient Advocate Encounter   Received notification from Inpatient Request that prior authorization for FreeStyle Libre 3 Plus Sensor is required/requested.   Insurance verification completed.   The patient is insured through [facility] [place] .   Per test claim: PA required; PA submitted to above mentioned insurance via CoverMyMeds Key/confirmation #/EOC [id number] Status is pending

## 2024-07-27 NOTE — Consult Note (Signed)
 Initial Consultation Note   Patient: Linda Carlson FMW:[id number] DOB: Jul 26, 1981 PCP: Rilla Baller, MD DOA: 07/27/2024 DOS: the patient was seen and examined on 07/27/2024 Primary service: Glennon Almarie POUR, MD  Referring physician: Glennon Almarie POUR, MD Reason for consult: Diabetes / Medical Management  HPI/Course: Patient with history of diabetes and adjustment disorder presented to OB/GYN today with abdominal pain.  Patient is status post lower robotic assisted laparoscopic hysterectomy with bilateral salpingectomy and cystoscopy on 6/4.  Patient has been having early intercourse for the past week because she read online to wait 6 weeks.  After intercourse yesterday she noticed some spotting but no gush of fluid.  Today she has had chills, nausea, vomiting.  She followed up with OB GYN and with her symptoms and labs was sent for admission in [facility] for concern of vaginitis versus other complication.  Patient did miss long-acting insulin  dose this morning which has already been reordered.  Denies fevers, chest pain, shortness of breath, constipation, diarrhea.  Review of Systems: As per HPI otherwise all other systems reviewed and are negative.  Past Medical History:  Diagnosis Date   Adenomyosis of uterus    Anemia    DUB (dysfunctional uterine bleeding)    Dysmenorrhea    Fatty liver 2014   History of abnormal cervical Pap smear    01-21-2023  ASCUS/ +HPV:   03-03-2024  ASCUS   History of cervical dysplasia    03-04-2023  s/p colposcopy  LSIL/  CIN 1   Menorrhagia with irregular cycle    Pain, pelvic, female    Seasonal allergic rhinitis    Type 2 diabetes mellitus (HCC)    followed by pcp;    (04-22-2024 pt stated checks blood sugar 2 times weekly fasting,  average 100-200)   Wears contact lenses     Past Surgical History:  Procedure Laterality Date   CESAREAN SECTION     2009  and  2011   CYSTOSCOPY N/A 06/01/2024   Procedure: CYSTOSCOPY;  Surgeon:  Glennon Almarie POUR, MD;  Location: [facility] OR;  Service: Gynecology;  Laterality: N/A;   HYSTERECTOMY, TOTAL, LAPAROSCOPIC, ROBOT-ASSISTED WITH SALPINGECTOMY Bilateral 06/01/2024   Procedure: HYSTERECTOMY, TOTAL, LAPAROSCOPIC, ROBOT-ASSISTED WITH SALPINGECTOMY;  Surgeon: Glennon Almarie POUR, MD;  Location: [facility] OR;  Service: Gynecology;  Laterality: Bilateral;   LAPAROSCOPIC TUBAL LIGATION Bilateral 2013    Social History  reports that she has never smoked. She has never used smokeless tobacco. She reports that she does not currently use alcohol. She reports that she does not use drugs.  No Known Allergies  Family History  Problem Relation Age of Onset   Ovarian cancer Mother    Diabetes Mother    Hypertension Mother    Diabetes Father    Heart attack Father    Breast cancer Neg Hx   Reviewed on admission  Prior to Admission medications   Medication Sig Start Date End Date Taking? Authorizing Provider  acetaminophen  (TYLENOL ) 500 MG tablet Take 500 mg by mouth every 6 (six) hours as needed for moderate pain (pain score 4-6).    [provider]  fluticasone  (FLONASE ) 50 MCG/ACT nasal spray Place 1 spray into both nostrils daily. 12/15/22   Mayer, Jodi R, NP  glipiZIDE -metformin  (METAGLIP ) 2.5-500 MG tablet Take 1 tablet by mouth 2 (two) times daily before a meal. Patient not taking: Reported on 07/27/2024 06/01/24   Glennon Almarie POUR, MD  ibuprofen  (ADVIL ) 200 MG tablet Take 400 mg by mouth every 6 (six) hours  as needed.    [provider]  ibuprofen  (ADVIL ) 800 MG tablet Take 1 tablet (800 mg total) by mouth every 8 (eight) hours as needed. 04/06/24   Glennon Almarie POUR, MD  insulin  glargine (LANTUS  SOLOSTAR) 100 UNIT/ML Solostar Pen Inject 10 Units into the skin daily. 06/06/24   Rilla Baller, MD  Insulin  Pen Needle 30G X 5 MM MISC Fill needles for Lantus  Solostar Pens 06/06/24   Rilla Baller, MD  metFORMIN  (GLUCOPHAGE ) 500 MG tablet Take 1 tablet by mouth 2 (two)  times daily.    [provider]  metoCLOPramide  (REGLAN ) 10 MG tablet Take 1 tablet (10 mg total) by mouth every 8 (eight) hours as needed for nausea. Patient not taking: Reported on 07/27/2024 04/06/24   Glennon Almarie POUR, MD  Multiple Vitamin (MULTIVITAMIN) tablet Take 1 tablet by mouth daily.    [provider]    Physical Exam: Vitals:   07/27/24 1135 07/27/24 1237 07/27/24 1300  BP: (!) 131/93 128/67   Pulse: (!) 101    Resp: 18    Temp: 97.6 F (36.4 C) 98.2 F (36.8 C)   TempSrc: Oral Oral   SpO2: 100% 100%   Weight:   72.6 kg  Height:   5' 4 (1.626 m)    Physical Exam Constitutional:      General: She is not in acute distress.    Appearance: She is ill-appearing.     Comments: Tired appearing  HENT:     Head: Normocephalic and atraumatic.     Mouth/Throat:     Mouth: Mucous membranes are moist.     Pharynx: Oropharynx is clear.  Eyes:     Extraocular Movements: Extraocular movements intact.     Pupils: Pupils are equal, round, and reactive to light.  Cardiovascular:     Rate and Rhythm: Normal rate and regular rhythm.     Pulses: Normal pulses.     Heart sounds: Normal heart sounds.  Pulmonary:     Effort: Pulmonary effort is normal. No respiratory distress.     Breath sounds: Normal breath sounds.  Abdominal:     General: Bowel sounds are normal. There is no distension.     Palpations: Abdomen is soft.     Tenderness: There is no abdominal tenderness.  Musculoskeletal:        General: No swelling or deformity.  Skin:    General: Skin is warm and dry.  Neurological:     General: No focal deficit present.     Mental Status: Mental status is at baseline.    Labs on Admission: I have personally reviewed following labs and imaging studies  CBC: Recent Labs  Lab 07/27/24 1226  WBC 12.8*  HGB 12.3  HCT 35.9*  MCV 86.5  PLT 285    Basic Metabolic Panel: Recent Labs  Lab 07/27/24 1226  NA 136  K 3.7  CL 104  CO2 19*   GLUCOSE 340*  BUN 10  CREATININE 0.67  CALCIUM  8.9    GFR: Estimated Creatinine Clearance: 87.7 mL/min (by C-G formula based on SCr of 0.67 mg/dL).  Liver Function Tests: Recent Labs  Lab 07/27/24 1226  AST 25  ALT 30  ALKPHOS 38  BILITOT 1.0  PROT 6.9  ALBUMIN 3.7    Urine analysis:    Component Value Date/Time   COLORURINE YELLOW 07/27/2024 1135   APPEARANCEUR CLOUDY (A) 07/27/2024 1135   LABSPEC 1.031 (H) 07/27/2024 1135   PHURINE 5.0 07/27/2024 1135   GLUCOSEU >=  500 (A) 07/27/2024 1135   HGBUR SMALL (A) 07/27/2024 1135   BILIRUBINUR NEGATIVE 07/27/2024 1135   BILIRUBINUR negative 09/23/2023 1012   KETONESUR NEGATIVE 07/27/2024 1135   PROTEINUR NEGATIVE 07/27/2024 1135   UROBILINOGEN 0.2 09/23/2023 1012   NITRITE NEGATIVE 07/27/2024 1135   LEUKOCYTESUR MODERATE (A) 07/27/2024 1135    Radiological Exams on Admission: CT ABDOMEN PELVIS W CONTRAST Result Date: 07/27/2024 CLINICAL DATA:  Generalized abdominal pain. Hysterectomy 06/01/2024. Cystoscopy 06/01/2024. EXAM: CT ABDOMEN AND PELVIS WITH CONTRAST TECHNIQUE: Multidetector CT imaging of the abdomen and pelvis was performed using the standard protocol following bolus administration of intravenous contrast. RADIATION DOSE REDUCTION: This exam was performed according to the departmental dose-optimization program which includes automated exposure control, adjustment of the mA and/or kV according to patient size and/or use of iterative reconstruction technique. CONTRAST:  75mL OMNIPAQUE  IOHEXOL  350 MG/ML SOLN COMPARISON:  07/31/2022 FINDINGS: Lower chest: Heart is normal size.  Visualized lung bases are clear. Hepatobiliary: Liver, gallbladder and biliary tree are normal. Pancreas: Normal. Spleen: Normal. Adrenals/Urinary Tract: Adrenal glands are normal. Kidneys are normal in size without hydronephrosis or nephrolithiasis. Stable bilateral renal cysts. Ureters are normal. Small amount air over the nondependent portion of  the bladder which may be due to recent instrumentation versus fistula. Stomach/Bowel: Stomach and small bowel are normal. Appendix is normal. There is minimal wall thickening of the rectosigmoid colon immediately adjacent the hysterectomy surgical site as the colon is otherwise unremarkable. Mild to moderate free peritoneal air is present. No definite evidence of bowel perforation. Vascular/Lymphatic: Minimal calcified plaque over the abdominal aorta which is normal in caliber. No evidence of adenopathy. Reproductive: Evidence of recent hysterectomy. Small flecks of air over the surgical site. Small amount of fluid over the surgical site. Adnexal regions are unremarkable. Other: None. Musculoskeletal: No acute findings. IMPRESSION: 1. Evidence of recent hysterectomy. Small amount of fluid and air over the surgical site. No definite abscess. Mild to moderate free peritoneal air without definite evidence of bowel perforation. Minimal nonspecific wall thickening of the rectosigmoid colon immediately adjacent the surgical site. Origin of free peritoneal air is inconclusive, although may be related to the hysterectomy surgical site. 2. Small amount of air over the nondependent portion of the bladder which may be due to recent instrumentation versus fistula. 3. Stable bilateral renal cysts. 4. Aortic atherosclerosis. Aortic Atherosclerosis (ICD10-I70.0). Ordering physician has been paged. Electronically Signed   By: Toribio Agreste M.D.   On: 07/27/2024 15:43    EKG: Not performed this admission  Assessment/Plan Principal Problem:   Abdominal pain Active Problems:   Type 2 diabetes mellitus with other specified complication (HCC)   Abdominal pain Suspected pelvic abscess/vaginitis > Seen by OB/GYN today for abdominal pain and spotting after intercourse status post hysterectomy/salpingectomy/cystoscopy. > Patient that would be okay because she read on the Internet to wait 6 weeks, OB/GYN has reiterated the  patient the need to wait until cleared by them. > Afterward yesterday patient had some spotting and woke this morning with chills nausea and vomiting.  Concern for infection and possible abscess. - Has been started on antibiotics and pain medication by OB/GYN - Continue with management by primary team  Diabetes > Uncontrolled with A1c 11.5 and CBG around 340 on admission.  Did miss a.m. long-acting insulin  dose; which has already been reordered to start today. - Continue with 10 units long-acting insulin  daily - Add SSI - Will titrate regimen as needed  TRH will continue to follow the patient.  Family  Communication: None on admission Primary team communication: Discussed patient at the time we were consulted Thank you very much for involving us  in the care of your patient.  Author: Marsa KATHEE Scurry, MD 07/27/2024 3:51 PM  For on call review [email or web address].

## 2024-07-27 NOTE — H&P (View-Only) (Signed)
 ADMIT NOTE  43 y.o. y.o. female here for abdominal pain. Patient with uncomplicated RLH, bilateral salpingectomy, cystoscopy. Reports she has been having early intercourse for a week. Reports she checked on line and it said 6 weeks to have intercourse. Had intercourse last night and had pain and spotting after. N/v this AM and chills. No large gushes of fluid. Last dose of metformin  yesterday. Takes AM insulin  and did not take it today.  Has not checked her fsbs this AM. Toradol  was given 15mg  now in the clinic IM for pain. No nausea medication here. Pain is worse with lying down.   Body mass index is 27.25 kg/m.  Blood pressure 136/82, pulse (!) 113, weight 160 lb (72.6 kg), SpO2 100%. Temp 98.5  Blood pressure 136/82, pulse (!) 113, weight 160 lb (72.6 kg), SpO2 100%.     Component Value Date/Time   DIAGPAP (A) 03/03/2024 0900    - Atypical squamous cells of undetermined significance (ASC-US )   DIAGPAP (A) 01/21/2023 1541    - Atypical squamous cells of undetermined significance (ASC-US )   HPVHIGH Negative 03/03/2024 0900   HPVHIGH Positive (A) 01/21/2023 1541   ADEQPAP  03/03/2024 0900    Satisfactory for evaluation; transformation zone component PRESENT.   ADEQPAP  01/21/2023 1541    Satisfactory but limited for evaluation with partially obscuring   ADEQPAP organisms; transformation zone component present. 01/21/2023 1541    GYN HISTORY:    Component Value Date/Time   DIAGPAP (A) 03/03/2024 0900    - Atypical squamous cells of undetermined significance (ASC-US )   DIAGPAP (A) 01/21/2023 1541    - Atypical squamous cells of undetermined significance (ASC-US )   HPVHIGH Negative 03/03/2024 0900   HPVHIGH Positive (A) 01/21/2023 1541   ADEQPAP  03/03/2024 0900    Satisfactory for evaluation; transformation zone component PRESENT.   ADEQPAP  01/21/2023 1541    Satisfactory but limited for evaluation with partially obscuring   ADEQPAP organisms; transformation zone  component present. 01/21/2023 1541    OB History  Gravida Para Term Preterm AB Living  2 2 2   2   SAB IAB Ectopic Multiple Live Births      2    # Outcome Date GA Lbr Len/2nd Weight Sex Type Anes PTL Lv  2 Term     F CS-LTranv   LIV  1 Term     F CS-LTranv   LIV    Past Medical History:  Diagnosis Date   Adenomyosis of uterus    Anemia    DUB (dysfunctional uterine bleeding)    Dysmenorrhea    Fatty liver 2014   History of abnormal cervical Pap smear    01-21-2023  ASCUS/ +HPV:   03-03-2024  ASCUS   History of cervical dysplasia    03-04-2023  s/p colposcopy  LSIL/  CIN 1   Menorrhagia with irregular cycle    Pain, pelvic, female    Seasonal allergic rhinitis    Type 2 diabetes mellitus (HCC)    followed by pcp;    (04-22-2024 pt stated checks blood sugar 2 times weekly fasting,  average 100-200)   Wears contact lenses     Past Surgical History:  Procedure Laterality Date   CESAREAN SECTION     2009  and  2011   CYSTOSCOPY N/A 06/01/2024   Procedure: CYSTOSCOPY;  Surgeon: Glennon Linda POUR, MD;  Location: [facility] OR;  Service: Gynecology;  Laterality: N/A;   HYSTERECTOMY, TOTAL, LAPAROSCOPIC, ROBOT-ASSISTED WITH SALPINGECTOMY Bilateral 06/01/2024   Procedure:  HYSTERECTOMY, TOTAL, LAPAROSCOPIC, ROBOT-ASSISTED WITH SALPINGECTOMY;  Surgeon: Glennon Linda POUR, MD;  [facility] OR;  Service: Gynecology;  Laterality: Bilateral;   LAPAROSCOPIC TUBAL LIGATION Bilateral 2013    Current Outpatient Medications on File Prior to Visit  Medication Sig Dispense Refill   acetaminophen  (TYLENOL ) 500 MG tablet Take 500 mg by mouth every 6 (six) hours as needed for moderate pain (pain score 4-6).     fluticasone  (FLONASE ) 50 MCG/ACT nasal spray Place 1 spray into both nostrils daily. 15.8 mL 0   ibuprofen  (ADVIL ) 200 MG tablet Take 400 mg by mouth every 6 (six) hours as needed.     ibuprofen  (ADVIL ) 800 MG tablet Take 1 tablet (800 mg total) by mouth every 8 (eight) hours as needed. 30  tablet 1   insulin  glargine (LANTUS  SOLOSTAR) 100 UNIT/ML Solostar Pen Inject 10 Units into the skin daily. 15 mL 1   Insulin  Pen Needle 30G X 5 MM MISC Fill needles for Lantus  Solostar Pens 100 each 3   metFORMIN  (GLUCOPHAGE ) 500 MG tablet Take 1 tablet by mouth 2 (two) times daily.     Multiple Vitamin (MULTIVITAMIN) tablet Take 1 tablet by mouth daily.     glipiZIDE -metformin  (METAGLIP ) 2.5-500 MG tablet Take 1 tablet by mouth 2 (two) times daily before a meal. (Patient not taking: Reported on 07/27/2024) 60 tablet 3   metoCLOPramide  (REGLAN ) 10 MG tablet Take 1 tablet (10 mg total) by mouth every 8 (eight) hours as needed for nausea. (Patient not taking: Reported on 07/27/2024) 10 tablet 0   No current facility-administered medications on file prior to visit.    Social History   Socioeconomic History   Marital status: Divorced    Spouse name: Not on file   Number of children: Not on file   Years of education: Not on file   Highest education level: Not on file  Occupational History   Not on file  Tobacco Use   Smoking status: Never   Smokeless tobacco: Never  Vaping Use   Vaping status: Never Used  Substance and Sexual Activity   Alcohol use: Not Currently    Comment: rare   Drug use: Never   Sexual activity: Yes    Partners: Male    Birth control/protection: Surgical  Other Topics Concern   Not on file  Social History Narrative   Not on file   Social Drivers of Health   Financial Resource Strain: Not on file  Food Insecurity: Not on file  Transportation Needs: Not on file  Physical Activity: Not on file  Stress: Not on file  Social Connections: Not on file  Intimate Partner Violence: Not on file    Family History  Problem Relation Age of Onset   Ovarian cancer Mother    Diabetes Mother    Hypertension Mother    Diabetes Father    Heart attack Father    Breast cancer Neg Hx      No Known Allergies    Patient's last menstrual period was No LMP recorded.  (Menstrual status: Irregular Periods)..            Review of Systems Alls systems reviewed and are negative.     Physical Exam Constitutional:      Appearance: Normal appearance.  Genitourinary:     Genitourinary Comments: Sve: cuff intact. Q tip palpation with no separation but tenderness noted greater on the right lateral portion of cuff 10cc yellow like discharge seen Labia with evidence of yeast infection  Abdominal:      Comments: Tender to lower abdomen no rebound slight guarding  Musculoskeletal:     Cervical back: Normal range of motion.  Neurological:     Mental Status: She is alert and oriented to person, place, and time.  Psychiatric:        Mood and Affect: Mood normal.        Behavior: Behavior normal.  Vitals and nursing note reviewed.       A:         early intercourse with pelvic pain, A2DM evaluate for suspected pelvic abscess, cuff intact.  RLH 06/01/24 and has been doing well up to last night, yeast infection on exam  Nuswab collected To admit for IV antibiotics.  Discussed POC and to get labs, CT scan possible IVR drain placement, if indicated.  Discussed she has to be on pelvic rest until cleared.  Again reviewed the risks of early intercourse with infection and or cuff separation and how dangerous this is.  Stressed again the importance of glucose control and medication compliance. Hospitalist consult for diabetes management No follow-ups on file.  Linda Carlson

## 2024-07-27 NOTE — Inpatient Diabetes Management (Addendum)
 Inpatient Diabetes Program Recommendations  AACE/ADA: New Consensus Statement on Inpatient Glycemic Control (2015)  Target Ranges:  Prepandial:   less than 140 mg/dL      Peak postprandial:   less than 180 mg/dL (1-2 hours)      Critically ill patients:  140 - 180 mg/dL   Lab Results  Component Value Date   GLUCAP 339 (H) 07/27/2024   HGBA1C 11.5 (H) 07/27/2024    Review of Glycemic Control  Latest Reference Range & Units 07/27/24 12:06  Glucose-Capillary 70 - 99 mg/dL 660 (H)  (H): Data is abnormally high  Diabetes history: DM2 Outpatient Diabetes medications: Lantus  10 units every day, Metformin  500 mg BID Current orders for Inpatient glycemic control: Semglee  10 units every day, Novolog  5 units x 1  Inpatient Diabetes Program Recommendations:    Please consider:  Novolog  0-15 units TID and 0-5 units at bedtime.  Met with patient at bedside.  Reviewed patient's current A1c of 11.5%. Explained what a A1c is and what it measures. Also reviewed goal A1c with patient, importance of good glucose control @ home, and blood sugar goals.  This A1C is down a bit from 3 months ago which was 12%.  She has been on Metformin  500 mg BID for several years.  She was started on Lantus  10 units just after her surgery in June.  Her PCP ordered Mounjaro  2.5 mg weekly but it was not approved with her insurance.  I believe a PA needs to be run and then Medicaid will cover.  She has a glucometer and checks her CBGs fasting every morning.  It has come down from 300's to low 200's.   Discussed basic pathophysiology of DM Type 2, basic home care, basic diabetes diet nutrition principles, importance of checking CBGs and maintaining good CBG control to prevent long-term and short-term complications. Reviewed signs and symptoms of hyperglycemia and hypoglycemia and how to treat hypoglycemia at home. Also reviewed blood sugar goals at home.  Ordered the [facility] booklet.  Her PCP is referring her to out patient  diabetes and nutrition management.  She does not drink caloric beverages anymore.  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  Will run a PA just incase she changes her mind.  Will follow while inpatient.    Thank you, Wyvonna Pinal, MSN, CDCES Diabetes Coordinator Inpatient Diabetes Program [phone] (team pager from 8a-5p)

## 2024-07-27 NOTE — H&P (Signed)
 Expand All Collapse All  ADMIT NOTE H&P   43 y.o. y.o. female here for abdominal pain. Patient with uncomplicated RLH, bilateral salpingectomy, cystoscopy. Reports she has been having early intercourse for a week. Reports she checked on line and it said 6 weeks to have intercourse. Had intercourse last night and had pain and spotting after. N/v this AM and chills. No large gushes of fluid. Last dose of metformin  yesterday. Takes AM insulin  and did not take it today.  Has not checked her fsbs this AM. Toradol  was given 15mg  now in the clinic IM for pain. No nausea medication here. Pain is worse with lying down. Body mass index is 27.25 kg/m.   Blood pressure 136/82, pulse (!) 113, weight 160 lb (72.6 kg), SpO2 100%. Temp 98.5   Blood pressure 136/82, pulse (!) 113, weight 160 lb (72.6 kg), SpO2 100%.   Labs (Brief)           Component Value Date/Time    DIAGPAP (A) 03/03/2024 0900      - Atypical squamous cells of undetermined significance (ASC-US )    DIAGPAP (A) 01/21/2023 1541      - Atypical squamous cells of undetermined significance (ASC-US )    HPVHIGH Negative 03/03/2024 0900    HPVHIGH Positive (A) 01/21/2023 1541    ADEQPAP   03/03/2024 0900      Satisfactory for evaluation; transformation zone component PRESENT.    ADEQPAP   01/21/2023 1541      Satisfactory but limited for evaluation with partially obscuring    ADEQPAP organisms; transformation zone component present. 01/21/2023 1541        GYN HISTORY: Labs (Brief)           Component Value Date/Time    DIAGPAP (A) 03/03/2024 0900      - Atypical squamous cells of undetermined significance (ASC-US )    DIAGPAP (A) 01/21/2023 1541      - Atypical squamous cells of undetermined significance (ASC-US )    HPVHIGH Negative 03/03/2024 0900    HPVHIGH Positive (A) 01/21/2023 1541    ADEQPAP   03/03/2024 0900      Satisfactory for evaluation; transformation zone component PRESENT.    ADEQPAP   01/21/2023 1541       Satisfactory but limited for evaluation with partially obscuring    ADEQPAP organisms; transformation zone component present. 01/21/2023 1541                         OB History  Gravida Para Term Preterm AB Living   2 2 2     2    SAB IAB Ectopic Multiple Live Births              2         # Outcome Date GA Lbr Len/2nd Weight Sex Type Anes PTL Lv  2 Term         F CS-LTranv     LIV  1 Term         F CS-LTranv     LIV          Past Medical History:  Diagnosis Date   Adenomyosis of uterus     Anemia     DUB (dysfunctional uterine bleeding)     Dysmenorrhea     Fatty liver 2014   History of abnormal cervical Pap smear      01-21-2023  ASCUS/ +HPV:   03-03-2024  ASCUS   History of cervical dysplasia  03-04-2023  s/p colposcopy  LSIL/  CIN 1   Menorrhagia with irregular cycle     Pain, pelvic, female     Seasonal allergic rhinitis     Type 2 diabetes mellitus (HCC)      followed by pcp;    (04-22-2024 pt stated checks blood sugar 2 times weekly fasting,  average 100-200)   Wears contact lenses                 Past Surgical History:  Procedure Laterality Date   CESAREAN SECTION        2009  and  2011   CYSTOSCOPY N/A 06/01/2024    Procedure: CYSTOSCOPY;  Surgeon: Glennon Almarie POUR, MD;  Location: [facility] OR;  Service: Gynecology;  Laterality: N/A;   HYSTERECTOMY, TOTAL, LAPAROSCOPIC, ROBOT-ASSISTED WITH SALPINGECTOMY Bilateral 06/01/2024    Procedure: HYSTERECTOMY, TOTAL, LAPAROSCOPIC, ROBOT-ASSISTED WITH SALPINGECTOMY;  Surgeon: Glennon Almarie POUR, MD;  Location: [facility] OR;  Service: Gynecology;  Laterality: Bilateral;   LAPAROSCOPIC TUBAL LIGATION Bilateral 2013          Medications Ordered Prior to Encounter        Current Outpatient Medications on File Prior to Visit  Medication Sig Dispense Refill   acetaminophen  (TYLENOL ) 500 MG tablet Take 500 mg by mouth every 6 (six) hours as needed for moderate pain (pain score 4-6).       fluticasone  (FLONASE ) 50 MCG/ACT  nasal spray Place 1 spray into both nostrils daily. 15.8 mL 0   ibuprofen  (ADVIL ) 200 MG tablet Take 400 mg by mouth every 6 (six) hours as needed.       ibuprofen  (ADVIL ) 800 MG tablet Take 1 tablet (800 mg total) by mouth every 8 (eight) hours as needed. 30 tablet 1   insulin  glargine (LANTUS  SOLOSTAR) 100 UNIT/ML Solostar Pen Inject 10 Units into the skin daily. 15 mL 1   Insulin  Pen Needle 30G X 5 MM MISC Fill needles for Lantus  Solostar Pens 100 each 3   metFORMIN  (GLUCOPHAGE ) 500 MG tablet Take 1 tablet by mouth 2 (two) times daily.       Multiple Vitamin (MULTIVITAMIN) tablet Take 1 tablet by mouth daily.       glipiZIDE -metformin  (METAGLIP ) 2.5-500 MG tablet Take 1 tablet by mouth 2 (two) times daily before a meal. (Patient not taking: Reported on 07/27/2024) 60 tablet 3   metoCLOPramide  (REGLAN ) 10 MG tablet Take 1 tablet (10 mg total) by mouth every 8 (eight) hours as needed for nausea. (Patient not taking: Reported on 07/27/2024) 10 tablet 0    No current facility-administered medications on file prior to visit.        Social History         Socioeconomic History   Marital status: Divorced      Spouse name: Not on file   Number of children: Not on file   Years of education: Not on file   Highest education level: Not on file  Occupational History   Not on file  Tobacco Use   Smoking status: Never   Smokeless tobacco: Never  Vaping Use   Vaping status: Never Used  Substance and Sexual Activity   Alcohol use: Not Currently      Comment: rare   Drug use: Never   Sexual activity: Yes      Partners: Male      Birth control/protection: Surgical  Other Topics Concern   Not on file  Social History Narrative   Not on file  Social Drivers of Manufacturing engineer Strain: Not on file  Food Insecurity: Not on file  Transportation Needs: Not on file  Physical Activity: Not on file  Stress: Not on file  Social Connections: Not on file  Intimate Partner Violence:  Not on file           Family History  Problem Relation Age of Onset   Ovarian cancer Mother     Diabetes Mother     Hypertension Mother     Diabetes Father     Heart attack Father     Breast cancer Neg Hx              Allergies  No Known Allergies         Patient's last menstrual period was No LMP recorded. (Menstrual status: Irregular Periods)..              Review of Systems Alls systems reviewed and are negative.     Physical Exam Constitutional:      Appearance: Normal appearance.  Genitourinary:     Genitourinary Comments: Sve: cuff intact. Q tip palpation with no separation but tenderness noted greater on the right lateral portion of cuff 10cc yellow like discharge seen Labia with evidence of yeast infection  Abdominal:      Comments: Tender to lower abdomen no rebound slight guarding  Musculoskeletal:     Cervical back: Normal range of motion.  Neurological:     Mental Status: She is alert and oriented to person, place, and time.  Psychiatric:        Mood and Affect: Mood normal.        Behavior: Behavior normal.  Vitals and nursing note reviewed.          A:         early intercourse with pelvic pain, A2DM evaluate for suspected pelvic abscess, cuff intact.  RLH 06/01/24 and has been doing well up to last night, yeast infection on exam   Nuswab collected To admit for IV antibiotics.  Discussed POC and to get labs, CT scan possible IVR drain placement, if indicated.  Discussed she has to be on pelvic rest until cleared.  Again reviewed the risks of early intercourse with infection and or cuff separation and how dangerous this is.  Stressed again the importance of glucose control and medication compliance. Hospitalist consult for diabetes management No follow-ups on file.   [name]

## 2024-07-27 NOTE — Progress Notes (Signed)
 Because Ms. Woolett,  I feel your condition warrants further evaluation and I recommend that you be seen in a face to face visit with your gynecologist or at one of our [facility] clinics.   NOTE: There will be NO CHARGE for this eVisit   If you are having a true medical emergency please call 911.    *Center for [facility] at [employer]             [address] [place], [place] [address] [phone] (*Take patients with no insurance)  *Center for [employer] at [employer] [address] [place], [place],  [place]  [address] [phone] (*Take patients with no insurance)  Center for [employer] at [employer]                                                             [address], [address], [place], [place], [address] [phone]  Center for [place] at [facility] [address], [address], [place], [place], [address] [phone]  Center for [facility] at [facility] [address], [place], [place], [address] [phone]  Center for [facility] at [facility]                                 [address] [place], [place], [place], [address] [phone]  Center for [facility] at [facility]                                    [address] [place], [place], [address] [phone]  Center for [facility] Healthcare at [facility] [address], [address], [place], [place], [address]                              [facility] [address], [place], [place], [address] [phone]  Your MyChart E-visit questionnaire answers were reviewed by a board certified advanced clinical practitioner to complete your personal care plan based on your specific symptoms.  Thank you for using e-Visits.   have provided 5 minutes of non face to face time during this encounter for chart review and documentation.

## 2024-07-27 NOTE — Progress Notes (Addendum)
 ADMIT NOTE  43 y.o. y.o. female here for abdominal pain. Patient with uncomplicated RLH, bilateral salpingectomy, cystoscopy. Reports she has been having early intercourse for a week. Reports she checked on line and it said 6 weeks to have intercourse. Had intercourse last night and had pain and spotting after. N/v this AM and chills. No large gushes of fluid. Last dose of metformin  yesterday. Takes AM insulin  and did not take it today.  Has not checked her fsbs this AM. Toradol  was given 15mg  now in the clinic IM for pain. No nausea medication here. Pain is worse with lying down.   Body mass index is 27.25 kg/m.  Blood pressure 136/82, pulse (!) 113, weight 160 lb (72.6 kg), SpO2 100%. Temp 98.5  Blood pressure 136/82, pulse (!) 113, weight 160 lb (72.6 kg), SpO2 100%.     Component Value Date/Time   DIAGPAP (A) 03/03/2024 0900    - Atypical squamous cells of undetermined significance (ASC-US )   DIAGPAP (A) 01/21/2023 1541    - Atypical squamous cells of undetermined significance (ASC-US )   HPVHIGH Negative 03/03/2024 0900   HPVHIGH Positive (A) 01/21/2023 1541   ADEQPAP  03/03/2024 0900    Satisfactory for evaluation; transformation zone component PRESENT.   ADEQPAP  01/21/2023 1541    Satisfactory but limited for evaluation with partially obscuring   ADEQPAP organisms; transformation zone component present. 01/21/2023 1541    GYN HISTORY:    Component Value Date/Time   DIAGPAP (A) 03/03/2024 0900    - Atypical squamous cells of undetermined significance (ASC-US )   DIAGPAP (A) 01/21/2023 1541    - Atypical squamous cells of undetermined significance (ASC-US )   HPVHIGH Negative 03/03/2024 0900   HPVHIGH Positive (A) 01/21/2023 1541   ADEQPAP  03/03/2024 0900    Satisfactory for evaluation; transformation zone component PRESENT.   ADEQPAP  01/21/2023 1541    Satisfactory but limited for evaluation with partially obscuring   ADEQPAP organisms; transformation zone  component present. 01/21/2023 1541    OB History  Gravida Para Term Preterm AB Living  2 2 2   2   SAB IAB Ectopic Multiple Live Births      2    # Outcome Date GA Lbr Len/2nd Weight Sex Type Anes PTL Lv  2 Term     F CS-LTranv   LIV  1 Term     F CS-LTranv   LIV    Past Medical History:  Diagnosis Date   Adenomyosis of uterus    Anemia    DUB (dysfunctional uterine bleeding)    Dysmenorrhea    Fatty liver 2014   History of abnormal cervical Pap smear    01-21-2023  ASCUS/ +HPV:   03-03-2024  ASCUS   History of cervical dysplasia    03-04-2023  s/p colposcopy  LSIL/  CIN 1   Menorrhagia with irregular cycle    Pain, pelvic, female    Seasonal allergic rhinitis    Type 2 diabetes mellitus (HCC)    followed by pcp;    (04-22-2024 pt stated checks blood sugar 2 times weekly fasting,  average 100-200)   Wears contact lenses     Past Surgical History:  Procedure Laterality Date   CESAREAN SECTION     2009  and  2011   CYSTOSCOPY N/A 06/01/2024   Procedure: CYSTOSCOPY;  Surgeon: Glennon Linda POUR, MD;  Location: [facility] OR;  Service: Gynecology;  Laterality: N/A;   HYSTERECTOMY, TOTAL, LAPAROSCOPIC, ROBOT-ASSISTED WITH SALPINGECTOMY Bilateral 06/01/2024   Procedure:  HYSTERECTOMY, TOTAL, LAPAROSCOPIC, ROBOT-ASSISTED WITH SALPINGECTOMY;  Surgeon: Glennon Linda POUR, MD;  Location: [facility] OR;  Service: Gynecology;  Laterality: Bilateral;   LAPAROSCOPIC TUBAL LIGATION Bilateral 2013    Current Outpatient Medications on File Prior to Visit  Medication Sig Dispense Refill   acetaminophen  (TYLENOL ) 500 MG tablet Take 500 mg by mouth every 6 (six) hours as needed for moderate pain (pain score 4-6).     fluticasone  (FLONASE ) 50 MCG/ACT nasal spray Place 1 spray into both nostrils daily. 15.8 mL 0   ibuprofen  (ADVIL ) 200 MG tablet Take 400 mg by mouth every 6 (six) hours as needed.     ibuprofen  (ADVIL ) 800 MG tablet Take 1 tablet (800 mg total) by mouth every 8 (eight) hours as needed. 30  tablet 1   insulin  glargine (LANTUS  SOLOSTAR) 100 UNIT/ML Solostar Pen Inject 10 Units into the skin daily. 15 mL 1   Insulin  Pen Needle 30G X 5 MM MISC Fill needles for Lantus  Solostar Pens 100 each 3   metFORMIN  (GLUCOPHAGE ) 500 MG tablet Take 1 tablet by mouth 2 (two) times daily.     Multiple Vitamin (MULTIVITAMIN) tablet Take 1 tablet by mouth daily.     glipiZIDE -metformin  (METAGLIP ) 2.5-500 MG tablet Take 1 tablet by mouth 2 (two) times daily before a meal. (Patient not taking: Reported on 07/27/2024) 60 tablet 3   metoCLOPramide  (REGLAN ) 10 MG tablet Take 1 tablet (10 mg total) by mouth every 8 (eight) hours as needed for nausea. (Patient not taking: Reported on 07/27/2024) 10 tablet 0   No current facility-administered medications on file prior to visit.    Social History   Socioeconomic History   Marital status: Divorced    Spouse name: Not on file   Number of children: Not on file   Years of education: Not on file   Highest education level: Not on file  Occupational History   Not on file  Tobacco Use   Smoking status: Never   Smokeless tobacco: Never  Vaping Use   Vaping status: Never Used  Substance and Sexual Activity   Alcohol use: Not Currently    Comment: rare   Drug use: Never   Sexual activity: Yes    Partners: Male    Birth control/protection: Surgical  Other Topics Concern   Not on file  Social History Narrative   Not on file   Social Drivers of Health   Financial Resource Strain: Not on file  Food Insecurity: Not on file  Transportation Needs: Not on file  Physical Activity: Not on file  Stress: Not on file  Social Connections: Not on file  Intimate Partner Violence: Not on file    Family History  Problem Relation Age of Onset   Ovarian cancer Mother    Diabetes Mother    Hypertension Mother    Diabetes Father    Heart attack Father    Breast cancer Neg Hx      No Known Allergies    Patient's last menstrual period was No LMP recorded.  (Menstrual status: Irregular Periods)..            Review of Systems Alls systems reviewed and are negative.     Physical Exam Constitutional:      Appearance: Normal appearance.  Genitourinary:     Genitourinary Comments: Sve: cuff intact. Q tip palpation with no separation but tenderness noted greater on the right lateral portion of cuff 10cc yellow like discharge seen Labia with evidence of yeast infection  Abdominal:      Comments: Tender to lower abdomen no rebound slight guarding  Musculoskeletal:     Cervical back: Normal range of motion.  Neurological:     Mental Status: She is alert and oriented to person, place, and time.  Psychiatric:        Mood and Affect: Mood normal.        Behavior: Behavior normal.  Vitals and nursing note reviewed.       A:         early intercourse with pelvic pain, A2DM evaluate for suspected pelvic abscess, cuff intact.  RLH 06/01/24 and has been doing well up to last night, yeast infection on exam  Nuswab collected To admit for IV antibiotics.  Discussed POC and to get labs, CT scan possible IVR drain placement, if indicated.  Discussed she has to be on pelvic rest until cleared.  Again reviewed the risks of early intercourse with infection and or cuff separation and how dangerous this is.  Stressed again the importance of glucose control and medication compliance. Hospitalist consult for diabetes management No follow-ups on file.  Linda Carlson

## 2024-07-27 NOTE — Telephone Encounter (Signed)
 Patient Product/process development scientist completed.    The patient is insured through [facility] [place].     Ran test claim for Dexcom G7 Sensor and Requires Prior Authorization  Ran test claim for [employer] 3 Plus Sensor and Requires Prior Authorization  This test claim was processed through [employer]- copay amounts may vary at other pharmacies due to [employer], or as the patient moves through the different stages of their insurance plan.     Reyes Sharps, CPHT Pharmacy Technician III Certified Patient Advocate [facility] Patient Advocate Team Direct Number: [phone]  Fax: [phone]

## 2024-07-28 ENCOUNTER — Encounter (HOSPITAL_COMMUNITY): Payer: Self-pay | Admitting: Obstetrics and Gynecology

## 2024-07-28 ENCOUNTER — Inpatient Hospital Stay (HOSPITAL_COMMUNITY)

## 2024-07-28 ENCOUNTER — Inpatient Hospital Stay (HOSPITAL_COMMUNITY): Admitting: Registered Nurse

## 2024-07-28 ENCOUNTER — Other Ambulatory Visit: Payer: Self-pay

## 2024-07-28 ENCOUNTER — Encounter (HOSPITAL_COMMUNITY)
Admission: AD | Disposition: A | Discharge status: Home / Self Care | Payer: Self-pay | Source: Home / Self Care | Attending: Obstetrics and Gynecology

## 2024-07-28 DIAGNOSIS — T8149XA Infection following a procedure, other surgical site, initial encounter: Secondary | ICD-10-CM

## 2024-07-28 DIAGNOSIS — T81328A Disruption or dehiscence of closure of other specified internal operation (surgical) wound, initial encounter: Secondary | ICD-10-CM | POA: Diagnosis present

## 2024-07-28 DIAGNOSIS — R109 Unspecified abdominal pain: Secondary | ICD-10-CM | POA: Diagnosis not present

## 2024-07-28 DIAGNOSIS — R1030 Lower abdominal pain, unspecified: Secondary | ICD-10-CM

## 2024-07-28 DIAGNOSIS — N939 Abnormal uterine and vaginal bleeding, unspecified: Secondary | ICD-10-CM

## 2024-07-28 HISTORY — PX: LAPAROSCOPY: SHX197

## 2024-07-28 HISTORY — PX: REPAIR VAGINAL CUFF: SHX6067

## 2024-07-28 HISTORY — PX: CYSTOSCOPY: SHX5120

## 2024-07-28 LAB — COMPREHENSIVE METABOLIC PANEL WITH GFR
ALT: 24 U/L (ref 0–44)
AST: 12 U/L — ABNORMAL LOW (ref 15–41)
Albumin: 3.1 g/dL — ABNORMAL LOW (ref 3.5–5.0)
Alkaline Phosphatase: 33 U/L — ABNORMAL LOW (ref 38–126)
Anion gap: 8 (ref 5–15)
BUN: 9 mg/dL (ref 6–20)
CO2: 24 mmol/L (ref 22–32)
Calcium: 8.6 mg/dL — ABNORMAL LOW (ref 8.9–10.3)
Chloride: 105 mmol/L (ref 98–111)
Creatinine, Ser: 0.54 mg/dL (ref 0.44–1.00)
GFR, Estimated: >60 mL/min (ref >60)
Glucose, Bld: 229 mg/dL — ABNORMAL HIGH (ref 70–99)
Potassium: 3.6 mmol/L (ref 3.5–5.1)
Sodium: 137 mmol/L (ref 135–145)
Total Bilirubin: 0.9 mg/dL (ref 0.0–1.2)
Total Protein: 6.5 g/dL (ref 6.5–8.1)

## 2024-07-28 LAB — CBC
HCT: 33.1 % — ABNORMAL LOW (ref 36.0–46.0)
Hemoglobin: 11.3 g/dL — ABNORMAL LOW (ref 12.0–15.0)
MCH: 29.4 pg (ref 26.0–34.0)
MCHC: 34.1 g/dL (ref 30.0–36.0)
MCV: 86.2 fL (ref 80.0–100.0)
Platelets: 269 K/uL (ref 150–400)
RBC: 3.84 MIL/uL — ABNORMAL LOW (ref 3.87–5.11)
RDW: 12.3 % (ref 11.5–15.5)
WBC: 8 K/uL (ref 4.0–10.5)
nRBC: 0 % (ref 0.0–0.2)

## 2024-07-28 LAB — GLUCOSE, CAPILLARY
Glucose-Capillary: 191 mg/dL — ABNORMAL HIGH (ref 70–99)
Glucose-Capillary: 199 mg/dL — ABNORMAL HIGH (ref 70–99)
Glucose-Capillary: 238 mg/dL — ABNORMAL HIGH (ref 70–99)
Glucose-Capillary: 241 mg/dL — ABNORMAL HIGH (ref 70–99)
Glucose-Capillary: 243 mg/dL — ABNORMAL HIGH (ref 70–99)
Glucose-Capillary: 250 mg/dL — ABNORMAL HIGH (ref 70–99)
Glucose-Capillary: 279 mg/dL — ABNORMAL HIGH (ref 70–99)

## 2024-07-28 LAB — SURESWAB® ADVANCED VAGINITIS PLUS,TMA
C. trachomatis RNA, TMA: NOT DETECTED
CANDIDA SPECIES: NOT DETECTED
Candida glabrata: DETECTED — AB
N. gonorrhoeae RNA, TMA: NOT DETECTED
SURESWAB(R) ADV BACTERIAL VAGINOSIS(BV),TMA: POSITIVE — AB
TRICHOMONAS VAGINALIS (TV),TMA: NOT DETECTED

## 2024-07-28 LAB — TYPE AND SCREEN
ABO/RH(D): A POS
Antibody Screen: NEGATIVE

## 2024-07-28 SURGERY — LAPAROSCOPY, DIAGNOSTIC
Anesthesia: General | Site: Vagina

## 2024-07-28 MED ORDER — KETOROLAC TROMETHAMINE 30 MG/ML IJ SOLN
INTRAMUSCULAR | Status: AC
Start: 2024-07-28 — End: 2024-07-28
  Filled 2024-07-28: qty 1

## 2024-07-28 MED ORDER — ORAL CARE MOUTH RINSE: 15.0000 mL | Freq: Once | OROMUCOSAL | Status: AC

## 2024-07-28 MED ORDER — LACTATED RINGERS IV SOLN: INTRAVENOUS | Status: DC

## 2024-07-28 MED ORDER — DEXAMETHASONE SODIUM PHOSPHATE 10 MG/ML IJ SOLN
INTRAMUSCULAR | Status: DC | PRN
  Administered 2024-07-28: 10 mg via INTRAVENOUS

## 2024-07-28 MED ORDER — METHYLENE BLUE (ANTIDOTE) 1 % IV SOLN
INTRAVENOUS | Status: AC
Start: 2024-07-28 — End: 2024-07-28
  Filled 2024-07-28: qty 10

## 2024-07-28 MED ORDER — HEPARIN SODIUM (PORCINE) 5000 UNIT/ML IJ SOLN
5000.0000 [IU] | Freq: Three times a day (TID) | INTRAMUSCULAR | Status: DC
  Administered 2024-07-28 – 2024-08-01 (×10): 5000 [IU] via SUBCUTANEOUS
  Filled 2024-07-28 (×10): qty 1

## 2024-07-28 MED ORDER — MIDAZOLAM HCL 2 MG/2ML IJ SOLN
INTRAMUSCULAR | Status: AC
  Filled 2024-07-28: qty 2

## 2024-07-28 MED ORDER — HYDROMORPHONE HCL 1 MG/ML IJ SOLN
INTRAMUSCULAR | Status: AC
  Filled 2024-07-28: qty 0.5

## 2024-07-28 MED ORDER — LIDOCAINE HCL (CARDIAC) PF 100 MG/5ML IV SOSY
PREFILLED_SYRINGE | INTRAVENOUS | Status: DC | PRN
  Administered 2024-07-28: 40 mg via INTRAVENOUS

## 2024-07-28 MED ORDER — SODIUM CHLORIDE 0.9 % IV SOLN
Freq: Once | INTRAVENOUS | Status: AC
  Administered 2024-07-28: 1000 mL
  Filled 2024-07-28: qty 10

## 2024-07-28 MED ORDER — DROPERIDOL 2.5 MG/ML IJ SOLN
0.6250 mg | Freq: Once | INTRAMUSCULAR | Status: AC | PRN
  Administered 2024-07-28: 0.625 mg via INTRAVENOUS

## 2024-07-28 MED ORDER — BUPIVACAINE HCL (PF) 0.5 % IJ SOLN
INTRAMUSCULAR | Status: DC | PRN
  Administered 2024-07-28: 10 mL

## 2024-07-28 MED ORDER — HYDROMORPHONE HCL 1 MG/ML IJ SOLN: 0.2500 mg | INTRAMUSCULAR | Status: DC | PRN

## 2024-07-28 MED ORDER — SUGAMMADEX SODIUM 200 MG/2ML IV SOLN
INTRAVENOUS | Status: DC | PRN
  Administered 2024-07-28: 200 mg via INTRAVENOUS

## 2024-07-28 MED ORDER — PHENYLEPHRINE 80 MCG/ML (10ML) SYRINGE FOR IV PUSH (FOR BLOOD PRESSURE SUPPORT)
PREFILLED_SYRINGE | INTRAVENOUS | Status: AC
  Filled 2024-07-28: qty 10

## 2024-07-28 MED ORDER — OXYCODONE HCL 5 MG/5ML PO SOLN: 5.0000 mg | Freq: Once | ORAL | Status: DC | PRN

## 2024-07-28 MED ORDER — SUCCINYLCHOLINE CHLORIDE 200 MG/10ML IV SOSY
PREFILLED_SYRINGE | INTRAVENOUS | Status: AC
  Filled 2024-07-28: qty 10

## 2024-07-28 MED ORDER — HYDROMORPHONE HCL 1 MG/ML IJ SOLN
INTRAMUSCULAR | Status: DC | PRN
  Administered 2024-07-28: .5 mg via INTRAVENOUS

## 2024-07-28 MED ORDER — BUPIVACAINE HCL (PF) 0.5 % IJ SOLN
INTRAMUSCULAR | Status: AC
Start: 2024-07-28 — End: 2024-07-28
  Filled 2024-07-28: qty 30

## 2024-07-28 MED ORDER — INSULIN ASPART 100 UNIT/ML IJ SOLN: 0.0000 [IU] | INTRAMUSCULAR | Status: DC | PRN

## 2024-07-28 MED ORDER — ONDANSETRON HCL 4 MG/2ML IJ SOLN
INTRAMUSCULAR | Status: AC
  Filled 2024-07-28: qty 4

## 2024-07-28 MED ORDER — IBUPROFEN 600 MG PO TABS: 600.0000 mg | ORAL_TABLET | Freq: Four times a day (QID) | ORAL | Status: DC

## 2024-07-28 MED ORDER — DEXAMETHASONE SODIUM PHOSPHATE 10 MG/ML IJ SOLN
INTRAMUSCULAR | Status: AC
Start: 2024-07-28 — End: 2024-07-28
  Filled 2024-07-28: qty 1

## 2024-07-28 MED ORDER — ACETAMINOPHEN 10 MG/ML IV SOLN: 1000.0000 mg | Freq: Once | INTRAVENOUS | Status: DC | PRN

## 2024-07-28 MED ORDER — PHENYLEPHRINE 80 MCG/ML (10ML) SYRINGE FOR IV PUSH (FOR BLOOD PRESSURE SUPPORT)
PREFILLED_SYRINGE | INTRAVENOUS | Status: DC | PRN
  Administered 2024-07-28: 40 ug via INTRAVENOUS

## 2024-07-28 MED ORDER — CHLORHEXIDINE GLUCONATE 0.12 % MT SOLN
OROMUCOSAL | Status: AC
  Administered 2024-07-28: 15 mL via OROMUCOSAL
  Filled 2024-07-28: qty 15

## 2024-07-28 MED ORDER — FENTANYL CITRATE (PF) 250 MCG/5ML IJ SOLN
INTRAMUSCULAR | Status: DC | PRN
  Administered 2024-07-28 (×5): 50 ug via INTRAVENOUS

## 2024-07-28 MED ORDER — FENTANYL CITRATE (PF) 250 MCG/5ML IJ SOLN
INTRAMUSCULAR | Status: AC
  Filled 2024-07-28: qty 5

## 2024-07-28 MED ORDER — DROPERIDOL 2.5 MG/ML IJ SOLN
INTRAMUSCULAR | Status: AC
  Filled 2024-07-28: qty 2

## 2024-07-28 MED ORDER — ROCURONIUM BROMIDE 10 MG/ML (PF) SYRINGE
PREFILLED_SYRINGE | INTRAVENOUS | Status: DC | PRN
  Administered 2024-07-28 (×3): 10 mg via INTRAVENOUS
  Administered 2024-07-28: 40 mg via INTRAVENOUS

## 2024-07-28 MED ORDER — KETOROLAC TROMETHAMINE 30 MG/ML IJ SOLN
INTRAMUSCULAR | Status: AC
  Filled 2024-07-28: qty 1

## 2024-07-28 MED ORDER — KETOROLAC TROMETHAMINE 30 MG/ML IJ SOLN
INTRAMUSCULAR | Status: DC | PRN
Start: 2024-07-28 — End: 2024-07-28
  Administered 2024-07-28: 30 mg via INTRAVENOUS

## 2024-07-28 MED ORDER — SCOPOLAMINE 1 MG/3DAYS TD PT72
1.0000 | MEDICATED_PATCH | Freq: Once | TRANSDERMAL | Status: DC
  Administered 2024-07-28: 1.5 mg via TRANSDERMAL
  Filled 2024-07-28: qty 1

## 2024-07-28 MED ORDER — MIDAZOLAM HCL 2 MG/2ML IJ SOLN
INTRAMUSCULAR | Status: DC | PRN
  Administered 2024-07-28: 1 mg via INTRAVENOUS

## 2024-07-28 MED ORDER — SUCCINYLCHOLINE CHLORIDE 200 MG/10ML IV SOSY
PREFILLED_SYRINGE | INTRAVENOUS | Status: AC
  Filled 2024-07-28: qty 20

## 2024-07-28 MED ORDER — MEPERIDINE HCL 25 MG/ML IJ SOLN: 6.2500 mg | INTRAMUSCULAR | Status: DC | PRN

## 2024-07-28 MED ORDER — ONDANSETRON HCL 4 MG/2ML IJ SOLN
INTRAMUSCULAR | Status: AC
  Administered 2024-07-28: 4 mg via INTRAVENOUS
  Filled 2024-07-28: qty 2

## 2024-07-28 MED ORDER — CHLORHEXIDINE GLUCONATE 0.12 % MT SOLN
15.0000 mL | Freq: Once | OROMUCOSAL | Status: AC
  Filled 2024-07-28: qty 15

## 2024-07-28 MED ORDER — SUGAMMADEX SODIUM 200 MG/2ML IV SOLN
INTRAVENOUS | Status: AC
  Filled 2024-07-28: qty 2

## 2024-07-28 MED ORDER — SILVER NITRATE-POT NITRATE 75-25 % EX MISC
CUTANEOUS | Status: AC
  Filled 2024-07-28: qty 10

## 2024-07-28 MED ORDER — SUGAMMADEX SODIUM 200 MG/2ML IV SOLN
INTRAVENOUS | Status: AC
Start: 2024-07-28 — End: 2024-07-28
  Filled 2024-07-28: qty 2

## 2024-07-28 MED ORDER — IOHEXOL 350 MG/ML SOLN
50.0000 mL | Freq: Once | INTRAVENOUS | Status: AC | PRN
  Administered 2024-07-28: 50 mL

## 2024-07-28 MED ORDER — ONDANSETRON HCL 4 MG/2ML IJ SOLN
INTRAMUSCULAR | Status: DC | PRN
  Administered 2024-07-28: 4 mg via INTRAVENOUS

## 2024-07-28 MED ORDER — LIDOCAINE 2% (20 MG/ML) 5 ML SYRINGE
INTRAMUSCULAR | Status: AC
  Filled 2024-07-28: qty 5

## 2024-07-28 MED ORDER — EPHEDRINE 5 MG/ML INJ
INTRAVENOUS | Status: AC
Start: 2024-07-28 — End: 2024-07-28
  Filled 2024-07-28: qty 10

## 2024-07-28 MED ORDER — INSULIN ASPART 100 UNIT/ML IJ SOLN
INTRAMUSCULAR | Status: AC
  Administered 2024-07-28: 6 [IU] via SUBCUTANEOUS
  Filled 2024-07-28: qty 1

## 2024-07-28 MED ORDER — OXYCODONE HCL 5 MG PO TABS: 5.0000 mg | ORAL_TABLET | Freq: Once | ORAL | Status: DC | PRN

## 2024-07-28 MED ORDER — ROCURONIUM BROMIDE 10 MG/ML (PF) SYRINGE
PREFILLED_SYRINGE | INTRAVENOUS | Status: AC
Start: 2024-07-28 — End: 2024-07-28
  Filled 2024-07-28: qty 10

## 2024-07-28 MED ORDER — SUCCINYLCHOLINE CHLORIDE 200 MG/10ML IV SOSY
PREFILLED_SYRINGE | INTRAVENOUS | Status: DC | PRN
  Administered 2024-07-28: 120 mg via INTRAVENOUS

## 2024-07-28 MED ORDER — 0.9 % SODIUM CHLORIDE (POUR BTL) OPTIME
TOPICAL | Status: DC | PRN
  Administered 2024-07-28: 1000 mL

## 2024-07-28 MED ORDER — INSULIN GLARGINE-YFGN 100 UNIT/ML ~~LOC~~ SOLN
16.0000 [IU] | Freq: Every day | SUBCUTANEOUS | Status: DC
  Administered 2024-07-28 – 2024-07-29 (×2): 16 [IU] via SUBCUTANEOUS
  Filled 2024-07-28 (×3): qty 0.16

## 2024-07-28 MED ORDER — OXIDIZED CELLULOSE EX PADS
MEDICATED_PAD | CUTANEOUS | Status: DC | PRN
  Administered 2024-07-28: 1 via TOPICAL

## 2024-07-28 MED ORDER — PROPOFOL 10 MG/ML IV BOLUS
INTRAVENOUS | Status: DC | PRN
  Administered 2024-07-28: 140 mg via INTRAVENOUS

## 2024-07-28 SURGICAL SUPPLY — 38 items
APPLICATOR COTTON TIP 6 STRL (MISCELLANEOUS) IMPLANT
BARRIER ADHS 3X4 INTERCEED (GAUZE/BANDAGES/DRESSINGS) IMPLANT
CHLORAPREP W/TINT 26 (MISCELLANEOUS) ×3 IMPLANT
DERMABOND ADVANCED .7 DNX12 (GAUZE/BANDAGES/DRESSINGS) IMPLANT
DRAPE SURG IRRIG POUCH 19X23 (DRAPES) ×3 IMPLANT
DRSG TEGADERM 4X4.75 (GAUZE/BANDAGES/DRESSINGS) IMPLANT
ELECTRODE REM PT RTRN 9FT ADLT (ELECTROSURGICAL) IMPLANT
GAUZE 4X4 16PLY ~~LOC~~+RFID DBL (SPONGE) IMPLANT
GLOVE BIOGEL PI IND STRL 7.0 (GLOVE) ×6 IMPLANT
GLOVE NEODERM STER SZ 7 (GLOVE) ×3 IMPLANT
GOWN STRL REUS W/ TWL LRG LVL3 (GOWN DISPOSABLE) ×9 IMPLANT
HEMOSTAT ARISTA ABSORB 3G PWDR (HEMOSTASIS) IMPLANT
IRRIGATION SUCT STRKRFLW 2 WTP (MISCELLANEOUS) IMPLANT
KIT PINK PAD W/HEAD ARM REST (MISCELLANEOUS) ×3 IMPLANT
KIT TURNOVER KIT B (KITS) ×3 IMPLANT
LIGASURE VESSEL 5MM BLUNT TIP (ELECTROSURGICAL) IMPLANT
MANIFOLD NEPTUNE II (INSTRUMENTS) ×3 IMPLANT
NS IRRIG 1000ML POUR BTL (IV SOLUTION) ×3 IMPLANT
PACK LAVH (CUSTOM PROCEDURE TRAY) ×3 IMPLANT
SET CYSTO W/LG BORE CLAMP LF (SET/KITS/TRAYS/PACK) ×3 IMPLANT
SET TUBE SMOKE EVAC HIGH FLOW (TUBING) ×3 IMPLANT
SLEEVE ADV FIXATION 5X100MM (TROCAR) ×6 IMPLANT
SURGILUBE 2OZ TUBE FLIPTOP (MISCELLANEOUS) IMPLANT
SUT MNCRL AB 4-0 PS2 18 (SUTURE) ×3 IMPLANT
SUT VIC AB 0 CT1 18XCR BRD8 (SUTURE) IMPLANT
SUT VIC AB 0 CT1 27XBRD ANTBC (SUTURE) IMPLANT
SUT VIC AB 0 CT1 27XCR 8 STRN (SUTURE) IMPLANT
SUT VIC AB 0 CT1 36 (SUTURE) IMPLANT
SUT VICRYL 0 27 CT2 27 ABS (SUTURE) IMPLANT
SUT VICRYL 0 UR6 27IN ABS (SUTURE) IMPLANT
SYR BULB IRRIG 60ML STRL (SYRINGE) IMPLANT
SYSTEM BAG RETRIEVAL 10MM (BASKET) IMPLANT
TOWEL GREEN STERILE FF (TOWEL DISPOSABLE) ×3 IMPLANT
TRAY FOLEY W/BAG SLVR 14FR (SET/KITS/TRAYS/PACK) ×3 IMPLANT
TROCAR ADV FIXATION 11X100MM (TROCAR) IMPLANT
TROCAR ADV FIXATION 5X100MM (TROCAR) IMPLANT
TROCAR KII 8X100ML NONTHREADED (TROCAR) ×3 IMPLANT
WARMER LAPAROSCOPE (MISCELLANEOUS) ×3 IMPLANT

## 2024-07-28 NOTE — Progress Notes (Signed)
 GYN NOTE  Patient with persistent nausea no vomitting.  Has not ate or drank. Pain lower abdomen 7/10 Patient states she feels miserable No fevers. Voiding but with little amounts and it will take more time to void Has to lay on her side due to the pain and pain is worse on her back, walking or sitting. No VB or discharge No fevers    Narrative & Impression  CLINICAL DATA:  Generalized abdominal pain. Hysterectomy 06/01/2024. Cystoscopy 06/01/2024.   EXAM: CT ABDOMEN AND PELVIS WITH CONTRAST   TECHNIQUE: Multidetector CT imaging of the abdomen and pelvis was performed using the standard protocol following bolus administration of intravenous contrast.   RADIATION DOSE REDUCTION: This exam was performed according to the departmental dose-optimization program which includes automated exposure control, adjustment of the mA and/or kV according to patient size and/or use of iterative reconstruction technique.   CONTRAST:  75mL OMNIPAQUE  IOHEXOL  350 MG/ML SOLN   COMPARISON:  07/31/2022   FINDINGS: Lower chest: Heart is normal size.  Visualized lung bases are clear.   Hepatobiliary: Liver, gallbladder and biliary tree are normal.   Pancreas: Normal.   Spleen: Normal.   Adrenals/Urinary Tract: Adrenal glands are normal. Kidneys are normal in size without hydronephrosis or nephrolithiasis. Stable bilateral renal cysts. Ureters are normal. Small amount air over the nondependent portion of the bladder which may be due to recent instrumentation versus fistula.   Stomach/Bowel: Stomach and small bowel are normal. Appendix is normal. There is minimal wall thickening of the rectosigmoid colon immediately adjacent the hysterectomy surgical site as the colon is otherwise unremarkable.   Mild to moderate free peritoneal air is present. No definite evidence of bowel perforation.   Vascular/Lymphatic: Minimal calcified plaque over the abdominal aorta which is normal in caliber. No  evidence of adenopathy.   Reproductive: Evidence of recent hysterectomy. Small flecks of air over the surgical site. Small amount of fluid over the surgical site. Adnexal regions are unremarkable.   Other: None.   Musculoskeletal: No acute findings.   IMPRESSION: 1. Evidence of recent hysterectomy. Small amount of fluid and air over the surgical site. No definite abscess. Mild to moderate free peritoneal air without definite evidence of bowel perforation. Minimal nonspecific wall thickening of the rectosigmoid colon immediately adjacent the surgical site. Origin of free peritoneal air is inconclusive, although may be related to the hysterectomy surgical site. 2. Small amount of air over the nondependent portion of the bladder which may be due to recent instrumentation versus fistula. 3. Stable bilateral renal cysts. 4. Aortic atherosclerosis.   Aortic Atherosclerosis (ICD10-I70.0).   Ordering physician has been paged.   Electronically Signed: By: Toribio Agreste M.D. On: 07/27/2024 15:43     Blood pressure 103/65, pulse 85, temperature 98.2 F (36.8 C), temperature source Oral, resp. rate 17, height 5' 4 (1.626 m), weight 72.6 kg, SpO2 100%.  CBC    Component Value Date/Time   WBC 8.0 07/28/2024 0525   RBC 3.84 (L) 07/28/2024 0525   HGB 11.3 (L) 07/28/2024 0525   HCT 33.1 (L) 07/28/2024 0525   PLT 269 07/28/2024 0525   MCV 86.2 07/28/2024 0525   MCH 29.4 07/28/2024 0525   MCHC 34.1 07/28/2024 0525   RDW 12.3 07/28/2024 0525   LYMPHSABS 2.0 01/01/2023 1644   MONOABS 0.5 01/01/2023 1644   EOSABS 0.2 01/01/2023 1644   BASOSABS 0.1 01/01/2023 1644   Fsbs 303,252,229,279,243  Abdomen: no guarding or rebound. Moderate diffuse lower abdominal pain to lower pelvis No  resp distress  A/p 6/4 RLH early intercourse with pneumoperitoneum and persistent n/v, A2DM Discussed with Dr. Colene with urology and to get ct cystogram to evaluate air in bladder.  He can be available  if needed in the OR today and is appreciated.  Air may also be from after intercourse and losing of the sutures vs. Truly in the bladder. Vaginal exam with no obvious cuff separation but with air and persistent symptoms, to move for exam in OR with diagnostic laparoscopy, vaginal exam and cystoscopy.  The procedure was reviewed with patient and she agreed. To place NPO and LR running.  White count in normal range today and afebrile Medicine is managing her sugars and is appreciated. Dr. Glennon

## 2024-07-28 NOTE — Interval H&P Note (Signed)
 History and Physical Interval Note:  07/28/2024 2:47 PM  Linda Carlson  has presented today for surgery, with the diagnosis of post-op, nausea, lower abdominal pain.  The various methods of treatment have been discussed with the patient and family. After consideration of risks, benefits and other options for treatment, the patient has consented to  Procedure(s) with comments: LAPAROSCOPY, DIAGNOSTIC (N/A) - with vaginal exam CYSTOSCOPY (N/A) REPAIR, VAGINAL CUFF (N/A) as a surgical intervention.  The patient's history has been reviewed, patient examined, no change in status, stable for surgery.  I have reviewed the patient's chart and labs.  Questions were answered to the patient's satisfaction.    Consented for Diagnostic laparoscopy, vaginal exam, possible cystoscopy if indicated R/b/a/I all reviewed with patient and patient agreed. [name]

## 2024-07-28 NOTE — Inpatient Diabetes Management (Signed)
 Inpatient Diabetes Program Recommendations  AACE/ADA: New Consensus Statement on Inpatient Glycemic Control (2015)  Target Ranges:  Prepandial:   less than 140 mg/dL      Peak postprandial:   less than 180 mg/dL (1-2 hours)      Critically ill patients:  140 - 180 mg/dL   Lab Results  Component Value Date   GLUCAP 243 (H) 07/28/2024   HGBA1C 11.5 (H) 07/27/2024    Review of Glycemic Control  Latest Reference Range & Units 07/27/24 15:41 07/27/24 20:12 07/27/24 21:27 07/28/24 00:23 07/28/24 08:51  Glucose-Capillary 70 - 99 mg/dL 696 (H) 747 (H) 770 (H) 279 (H) 243 (H)  (H): Data is abnormally high Diabetes history: DM2 Outpatient Diabetes medications: Lantus  10 units every day, Metformin  500 mg BID Current orders for Inpatient glycemic control: Semglee  10 units every day, Novolog  0-15 units TID & HS   Inpatient Diabetes Program Recommendations:    Consider increasing Semglee  16 units every day.  Thanks, Tinnie Minus, MSN, RNC-OB Diabetes Coordinator [phone] (8a-5p)

## 2024-07-28 NOTE — Transfer of Care (Signed)
 Immediate Anesthesia Transfer of Care Note  Patient: Linda Carlson  Procedure(s) Performed: OPERATIVE LAPAROSCOPY (Abdomen) CYSTOSCOPY (Bladder) REPAIR, VAGINAL CUFF (Vagina )  Patient Location: PACU  Anesthesia Type:General  Level of Consciousness: awake and alert   Airway & Oxygen Therapy: Patient Spontanous Breathing and Patient connected to nasal cannula oxygen  Post-op Assessment: Report given to RN and Post -op Vital signs reviewed and stable  Post vital signs: Reviewed and stable  Last Vitals:  Vitals Value Taken Time  BP 137/78 07/28/24 17:55  Temp    Pulse 92 07/28/24 17:56  Resp 14 07/28/24 17:56  SpO2 94 % 07/28/24 17:56  Vitals shown include unfiled device data.  Last Pain:  Vitals:   07/28/24 1424  TempSrc:   PainSc: 10-Worst pain ever         Complications: No notable events documented.

## 2024-07-28 NOTE — Anesthesia Preprocedure Evaluation (Addendum)
 Anesthesia Evaluation  Patient identified by MRN, date of birth, ID band Patient awake    Reviewed: Allergy & Precautions, NPO status , Patient's Chart, lab work & pertinent test results  Airway Mallampati: III  TM Distance: >3 FB Neck ROM: Full    Dental  (+) Teeth Intact, Dental Advisory Given   Pulmonary neg pulmonary ROS   breath sounds clear to auscultation       Cardiovascular negative cardio ROS  Rhythm:Regular Rate:Normal     Neuro/Psych  PSYCHIATRIC DISORDERS      negative neurological ROS     GI/Hepatic negative GI ROS, Neg liver ROS,,,  Endo/Other  diabetes, Type 2, Oral Hypoglycemic Agents, Insulin  Dependent    Renal/GU negative Renal ROS     Musculoskeletal   Abdominal   Peds  Hematology  (+) Blood dyscrasia, anemia   Anesthesia Other Findings   Reproductive/Obstetrics                              Anesthesia Physical Anesthesia Plan  ASA: 2  Anesthesia Plan: General   Post-op Pain Management: Tylenol  PO (pre-op)* and Toradol  IV (intra-op)*   Induction: Intravenous  PONV Risk Score and Plan: 4 or greater and Ondansetron , Dexamethasone , Midazolam  and Scopolamine  patch - Pre-op  Airway Management Planned: Oral ETT  Additional Equipment: None  Intra-op Plan:   Post-operative Plan: Extubation in OR  Informed Consent: I have reviewed the patients History and Physical, chart, labs and discussed the procedure including the risks, benefits and alternatives for the proposed anesthesia with the patient or authorized representative who has indicated his/her understanding and acceptance.     Dental advisory given  Plan Discussed with: CRNA  Anesthesia Plan Comments: (Lab Results      Component                Value               Date                      WBC                      8.0                 07/28/2024                HGB                      11.3 (L)             07/28/2024                HCT                      33.1 (L)            07/28/2024                MCV                      86.2                07/28/2024                PLT                      269  07/28/2024           )         Anesthesia Quick Evaluation

## 2024-07-28 NOTE — Plan of Care (Signed)
  Problem: Education: Goal: Knowledge of General Education information will improve Description: Including pain rating scale, medication(s)/side effects and non-pharmacologic comfort measures Outcome: Progressing   Problem: Health Behavior/Discharge Planning: Goal: Ability to manage health-related needs will improve Outcome: Progressing   Problem: Clinical Measurements: Goal: Ability to maintain clinical measurements within normal limits will improve Outcome: Progressing Goal: Will remain free from infection Outcome: Progressing Goal: Diagnostic test results will improve Outcome: Progressing Goal: Respiratory complications will improve Outcome: Progressing Goal: Cardiovascular complication will be avoided Outcome: Progressing   Problem: Activity: Goal: Risk for activity intolerance will decrease Outcome: Progressing   Problem: Nutrition: Goal: Adequate nutrition will be maintained Outcome: Progressing   Problem: Coping: Goal: Level of anxiety will decrease Outcome: Progressing   Problem: Elimination: Goal: Will not experience complications related to bowel motility Outcome: Progressing Goal: Will not experience complications related to urinary retention Outcome: Progressing   Problem: Pain Managment: Goal: General experience of comfort will improve and/or be controlled Outcome: Progressing   Problem: Safety: Goal: Ability to remain free from injury will improve Outcome: Progressing   Problem: Skin Integrity: Goal: Risk for impaired skin integrity will decrease Outcome: Progressing   Problem: Education: Goal: Knowledge of the prescribed therapeutic regimen will improve Outcome: Progressing Goal: Understanding of sexual limitations or changes related to disease process or condition will improve Outcome: Progressing Goal: Individualized Educational Video(s) Outcome: Progressing   Problem: Self-Concept: Goal: Communication of feelings regarding changes in body  function or appearance will improve Outcome: Progressing   Problem: Skin Integrity: Goal: Demonstration of wound healing without infection will improve Outcome: Progressing   Problem: Education: Goal: Ability to describe self-care measures that may prevent or decrease complications (Diabetes Survival Skills Education) will improve Outcome: Progressing Goal: Individualized Educational Video(s) Outcome: Progressing   Problem: Coping: Goal: Ability to adjust to condition or change in health will improve Outcome: Progressing   Problem: Fluid Volume: Goal: Ability to maintain a balanced intake and output will improve Outcome: Progressing   Problem: Health Behavior/Discharge Planning: Goal: Ability to identify and utilize available resources and services will improve Outcome: Progressing Goal: Ability to manage health-related needs will improve Outcome: Progressing   Problem: Metabolic: Goal: Ability to maintain appropriate glucose levels will improve Outcome: Progressing   Problem: Nutritional: Goal: Maintenance of adequate nutrition will improve Outcome: Progressing Goal: Progress toward achieving an optimal weight will improve Outcome: Progressing   Problem: Skin Integrity: Goal: Risk for impaired skin integrity will decrease Outcome: Progressing   Problem: Tissue Perfusion: Goal: Adequacy of tissue perfusion will improve Outcome: Progressing

## 2024-07-28 NOTE — Addendum Note (Signed)
 Addended by: GLENNON ALMARIE POUR on: 07/28/2024 08:25 AM   Modules accepted: Orders

## 2024-07-28 NOTE — Consult Note (Signed)
 [facility]    Date of Admission:  07/27/2024     Total days of antibiotics 1               Reason for Consult: Pelvic abscess   Referring Provider: Dr. Glennon Primary Care Provider: Rilla Baller, MD   ASSESSMENT:  Ms. Linda Carlson is a 43 y/o caucasian female with Type 2 diabetes recently undergoing hysterectomy with bilateral salpingectomy and cystoscopy presenting with acute abdominal pain and nausea and found to have suspected pneumoperitoneum. OR today per Gynecology for diagnostic laparoscopy and cystoscopy. No abscess noted on imaging. Discussed recommended plan of care to continue with piperacillin -tazobactam pending surgical findings. Unlikely any STD. Diabetes management per Internal Medicine. Standard/universal precautions. Remaining medical and supportive care per Gynecology.   PLAN:  Continue current dose of piperacillin -tazobactam. Diagnostic laparoscopy and post-surgical care per Gynecology.  Diabetes management per Internal Medicine.  Await any culture results.  Standard/universal precautions.  Remaining medical and supportive care per Gynecology.    Principal Problem:   Abdominal pain Active Problems:   Type 2 diabetes mellitus with other specified complication (HCC)    calcium  carbonate  2 tablet Oral Q6H   docusate sodium   100 mg Oral BID   estradiol   1 Applicatorful Vaginal QHS   ibuprofen   600 mg Oral Q6H   insulin  aspart  0-15 Units Subcutaneous TID WC   insulin  aspart  0-5 Units Subcutaneous QHS   [START ON 07/29/2024] insulin  glargine-yfgn  16 Units Subcutaneous Daily   lactobacillus  1 g Oral TID WC   metoCLOPramide  (REGLAN ) injection  10 mg Intravenous Q6H   pantoprazole  (PROTONIX ) IV  40 mg Intravenous QHS     HPI: Linda Carlson is a 43 y.o. female with previous medical history of Type 2 diabetes and recent hysterectomy with bilateral salpingectomy on 06/01/24 presenting with abdominal pain.  Ms. Linda Carlson  underwent hysterectomy with bilateral salpingectomy and cystoscopy on 06/01/24. Recovery course was unremarkable until 07/27/23 when she developed abdominal pain and spotting following intercourse. Had nausea and chills. Afebrile and without leukocytosis on presentation. CT abdomen/pelvis with evidence of post-hysterectomy and small amount of fluid and air over the surgical site with no definitive abscess; and mild to moderate free peritoneal air without bowel perforation. Findings concerning for pneumoperitoneum with Gynecology recommending OR for diagnostic laparoscopy and cystogram given concern for possible air in the bladder.  Started on piperacillin -tazobactam.   Ms. Ulyess is having continued nausea this morning with abdominal pain around around the level of her umbilicus. Has spotting with no purulent drainage.  Review of Systems: Review of Systems  Constitutional:  Negative for chills, fever and weight loss.  Respiratory:  Negative for cough, shortness of breath and wheezing.   Cardiovascular:  Negative for chest pain and leg swelling.  Gastrointestinal:  Positive for abdominal pain and nausea. Negative for constipation, diarrhea and vomiting.  Skin:  Negative for rash.     Past Medical History:  Diagnosis Date   Adenomyosis of uterus    Anemia    DUB (dysfunctional uterine bleeding)    Dysmenorrhea    Fatty liver 2014   History of abnormal cervical Pap smear    01-21-2023  ASCUS/ +HPV:   03-03-2024  ASCUS   History of cervical dysplasia    03-04-2023  s/p colposcopy  LSIL/  CIN 1   Menorrhagia with irregular cycle    Pain, pelvic, female    Seasonal allergic rhinitis    Type 2  diabetes mellitus (HCC)    followed by pcp;    (04-22-2024 pt stated checks blood sugar 2 times weekly fasting,  average 100-200)   Wears contact lenses     Social History   Tobacco Use   Smoking status: Never   Smokeless tobacco: Never  Vaping Use   Vaping status: Never Used  Substance Use Topics    Alcohol use: Not Currently    Comment: rare   Drug use: Never    Family History  Problem Relation Age of Onset   Ovarian cancer Mother    Diabetes Mother    Hypertension Mother    Diabetes Father    Heart attack Father    Breast cancer Neg Hx     No Known Allergies  OBJECTIVE: Blood pressure (!) 153/77, pulse 90, temperature 98.2 F (36.8 C), temperature source Oral, resp. rate 18, height 5' 4 (1.626 m), weight 72.6 kg, last menstrual period 05/11/2024, SpO2 100%.  Physical Exam Constitutional:      General: She is not in acute distress.    Appearance: She is well-developed.  Cardiovascular:     Rate and Rhythm: Normal rate and regular rhythm.     Heart sounds: Normal heart sounds.  Pulmonary:     Effort: Pulmonary effort is normal.     Breath sounds: Normal breath sounds.  Skin:    General: Skin is warm and dry.  Neurological:     Mental Status: She is alert and oriented to person, place, and time.     Lab Results Lab Results  Component Value Date   WBC 8.0 07/28/2024   HGB 11.3 (L) 07/28/2024   HCT 33.1 (L) 07/28/2024   MCV 86.2 07/28/2024   PLT 269 07/28/2024    Lab Results  Component Value Date   CREATININE 0.54 07/28/2024   BUN 9 07/28/2024   NA 137 07/28/2024   K 3.6 07/28/2024   CL 105 07/28/2024   CO2 24 07/28/2024    Lab Results  Component Value Date   ALT 24 07/28/2024   AST 12 (L) 07/28/2024   ALKPHOS 33 (L) 07/28/2024   BILITOT 0.9 07/28/2024     Microbiology: No results found for this or any previous visit (from the past 240 hours).   Greg Prabhjot Piscitello, NP [facility]  07/28/2024  1:11 PM

## 2024-07-28 NOTE — Progress Notes (Signed)
 PROGRESS NOTE    Linda Carlson  FMW:[id number] DOB: 1981-11-09 DOA: 07/27/2024 PCP: Rilla Baller, MD   Brief Narrative:  This 43 years old female with PMH significant for diabetes and adjustment disorder presented to the OB/GYN with abdominal pain.She is s/p  lower robotic assisted laparoscopic hysterectomy with bilateral salpingectomy and cystoscopy on 6/4. Patient has been having early intercourse for the past week because she read online to wait 6 weeks. After intercourse yesterday she noticed some spotting but no gush of fluid. Today she has had chills, nausea, vomiting.  Patient was admitted for further evaluation.Patient did miss long-acting insulin  dose yesterday which has been resumed.  We were consulted for diabetic management.  Assessment & Plan:   Principal Problem:   Abdominal pain Active Problems:   Type 2 diabetes mellitus with other specified complication (HCC)   Abdominal pain: Suspected pelvic abscess/vaginitis: She was seen by OB/GYN  for abdominal pain and spotting after intercourse , She is status post hysterectomy/salpingectomy/cystoscopy. OB/GYN has reiterated the patient the need to wait until cleared by them. Afterward  patient had some spotting and woke this morning with chills nausea and vomiting.   Concern for infection and possible abscess. Has been started on antibiotics and pain medication by OB/GYN Continue with management by primary team   Diabetes Mellitus II: Uncontrolled with Hb A1c 11.5 and CBG around 340 on admission.   Did miss a.m. long-acting insulin  dose;  Started on 10 units of Semglee  daily > increase Semglee  to 16 units today.. Continue regular insulin  sliding scale. Will titrate regimen as needed.   TRH will continue to follow the patient.  Antimicrobials:  Anti-infectives (From admission, onward)    Start     Dose/Rate Route Frequency Ordered Stop   07/27/24 1215  piperacillin -tazobactam (ZOSYN ) IVPB 3.375 g  Status:   Discontinued        3.375 g 100 mL/hr over 30 Minutes Intravenous Every 6 hours 07/27/24 1119 07/27/24 1126   07/27/24 1200  piperacillin -tazobactam (ZOSYN ) IVPB 3.375 g        3.375 g 12.5 mL/hr over 240 Minutes Intravenous Every 8 hours 07/27/24 1128        Subjective: Patient was seen and examined at bedside. Overnight events noted. Patient reports having pain,  states she is going to have incision and drainage today.  Objective: Vitals:   07/27/24 1956 07/27/24 2318 07/28/24 0406 07/28/24 0846  BP: 111/61 (!) 116/56 103/65 (!) 110/59  Pulse: 95 91 85 83  Resp: 17 17 17 16   Temp: 98 F (36.7 C) 98 F (36.7 C) 98.2 F (36.8 C) 98 F (36.7 C)  TempSrc:   Oral Oral  SpO2: 100% 100% 100% 100%  Weight:      Height:        Intake/Output Summary (Last 24 hours) at 07/28/2024 1132 Last data filed at 07/28/2024 0102 Gross per 24 hour  Intake 233.87 ml  Output --  Net 233.87 ml   Filed Weights   07/27/24 1300  Weight: 72.6 kg    Examination:  General exam: Appears calm and comfortable, not in any acute distress. Respiratory system: CTA Bilaterally. Respiratory effort normal. RR 16 Cardiovascular system: S1 & S2 heard, RRR. No JVD, murmurs, rubs, gallops or clicks.  Gastrointestinal system: Abdomen is non distended, soft and non tender.  Normal bowel sounds heard. Central nervous system: Alert and oriented x 3. No focal neurological deficits. Extremities: No edema, no cyanosis, no clubbing. Skin: No rashes, lesions or ulcers Psychiatry: Judgement  and insight appear normal. Mood & affect appropriate.     Data Reviewed: I have personally reviewed following labs and imaging studies  CBC: Recent Labs  Lab 07/27/24 1226 07/28/24 0525  WBC 12.8* 8.0  HGB 12.3 11.3*  HCT 35.9* 33.1*  MCV 86.5 86.2  PLT 285 269   Basic Metabolic Panel: Recent Labs  Lab 07/27/24 1226 07/28/24 0525  NA 136 137  K 3.7 3.6  CL 104 105  CO2 19* 24  GLUCOSE 340* 229*  BUN 10 9   CREATININE 0.67 0.54  CALCIUM  8.9 8.6*   GFR: Estimated Creatinine Clearance: 87.7 mL/min (by C-G formula based on SCr of 0.54 mg/dL). Liver Function Tests: Recent Labs  Lab 07/27/24 1226 07/28/24 0525  AST 25 12*  ALT 30 24  ALKPHOS 38 33*  BILITOT 1.0 0.9  PROT 6.9 6.5  ALBUMIN 3.7 3.1*   No results for input(s): LIPASE, AMYLASE in the last 168 hours. No results for input(s): AMMONIA in the last 168 hours. Coagulation Profile: No results for input(s): INR, PROTIME in the last 168 hours. Cardiac Enzymes: No results for input(s): CKTOTAL, CKMB, CKMBINDEX, TROPONINI in the last 168 hours. BNP (last 3 results) No results for input(s): PROBNP in the last 8760 hours. HbA1C: Recent Labs    07/27/24 1224  HGBA1C 11.5*   CBG: Recent Labs  Lab 07/27/24 1541 07/27/24 2012 07/27/24 2127 07/28/24 0023 07/28/24 0851  GLUCAP 303* 252* 229* 279* 243*   Lipid Profile: No results for input(s): CHOL, HDL, LDLCALC, TRIG, CHOLHDL, LDLDIRECT in the last 72 hours. Thyroid Function Tests: No results for input(s): TSH, T4TOTAL, FREET4, T3FREE, THYROIDAB in the last 72 hours. Anemia Panel: No results for input(s): VITAMINB12, FOLATE, FERRITIN, TIBC, IRON, RETICCTPCT in the last 72 hours. Sepsis Labs: No results for input(s): PROCALCITON, LATICACIDVEN in the last 168 hours.  No results found for this or any previous visit (from the past 240 hours).   Radiology Studies: DG Abd 2 Views Result Date: 07/28/2024 CLINICAL DATA:  Nausea and vomiting. Recent history of hysterectomy on 06/01/2024. EXAM: ABDOMEN - 2 VIEW COMPARISON:  CT abdomen/pelvis dated 07/27/2024. FINDINGS: Nonobstructive bowel-gas pattern. Intraperitoneal free air is better visualized on the prior CT abdomen/pelvis dated 07/27/2024. Moderate volume of stool in the right colon. No acute osseous abnormality. IMPRESSION: 1. Intraperitoneal free air is better visualized  on the prior CT abdomen/pelvis dated 07/27/2024. 2. Nonobstructive bowel gas pattern. Electronically Signed   By: Harrietta Sherry M.D.   On: 07/28/2024 11:28   CT ABDOMEN PELVIS W CONTRAST Addendum Date: 07/27/2024 ADDENDUM REPORT: 07/27/2024 16:06 ADDENDUM: Critical Value/emergent results were called by telephone at the time of interpretation on 07/27/2024 at 4:06 pm to provider ALMARIE CARPEN , who verbally acknowledged these results. Electronically Signed   By: Toribio Agreste M.D.   On: 07/27/2024 16:06   Result Date: 07/27/2024 CLINICAL DATA:  Generalized abdominal pain. Hysterectomy 06/01/2024. Cystoscopy 06/01/2024. EXAM: CT ABDOMEN AND PELVIS WITH CONTRAST TECHNIQUE: Multidetector CT imaging of the abdomen and pelvis was performed using the standard protocol following bolus administration of intravenous contrast. RADIATION DOSE REDUCTION: This exam was performed according to the departmental dose-optimization program which includes automated exposure control, adjustment of the mA and/or kV according to patient size and/or use of iterative reconstruction technique. CONTRAST:  75mL OMNIPAQUE  IOHEXOL  350 MG/ML SOLN COMPARISON:  07/31/2022 FINDINGS: Lower chest: Heart is normal size.  Visualized lung bases are clear. Hepatobiliary: Liver, gallbladder and biliary tree are normal. Pancreas: Normal. Spleen: Normal. Adrenals/Urinary  Tract: Adrenal glands are normal. Kidneys are normal in size without hydronephrosis or nephrolithiasis. Stable bilateral renal cysts. Ureters are normal. Small amount air over the nondependent portion of the bladder which may be due to recent instrumentation versus fistula. Stomach/Bowel: Stomach and small bowel are normal. Appendix is normal. There is minimal wall thickening of the rectosigmoid colon immediately adjacent the hysterectomy surgical site as the colon is otherwise unremarkable. Mild to moderate free peritoneal air is present. No definite evidence of bowel perforation.  Vascular/Lymphatic: Minimal calcified plaque over the abdominal aorta which is normal in caliber. No evidence of adenopathy. Reproductive: Evidence of recent hysterectomy. Small flecks of air over the surgical site. Small amount of fluid over the surgical site. Adnexal regions are unremarkable. Other: None. Musculoskeletal: No acute findings. IMPRESSION: 1. Evidence of recent hysterectomy. Small amount of fluid and air over the surgical site. No definite abscess. Mild to moderate free peritoneal air without definite evidence of bowel perforation. Minimal nonspecific wall thickening of the rectosigmoid colon immediately adjacent the surgical site. Origin of free peritoneal air is inconclusive, although may be related to the hysterectomy surgical site. 2. Small amount of air over the nondependent portion of the bladder which may be due to recent instrumentation versus fistula. 3. Stable bilateral renal cysts. 4. Aortic atherosclerosis. Aortic Atherosclerosis (ICD10-I70.0). Ordering physician has been paged. Electronically Signed: By: Toribio Agreste M.D. On: 07/27/2024 15:43   Scheduled Meds:  calcium  carbonate  2 tablet Oral Q6H   docusate sodium   100 mg Oral BID   estradiol   1 Applicatorful Vaginal QHS   ibuprofen   600 mg Oral Q6H   insulin  aspart  0-15 Units Subcutaneous TID WC   insulin  aspart  0-5 Units Subcutaneous QHS   [START ON 07/29/2024] insulin  glargine-yfgn  16 Units Subcutaneous Daily   lactobacillus  1 g Oral TID WC   metoCLOPramide  (REGLAN ) injection  10 mg Intravenous Q6H   pantoprazole  (PROTONIX ) IV  40 mg Intravenous QHS   Continuous Infusions:  famotidine  (PEPCID ) IV Stopped (07/28/24 0102)   lactated ringers  125 mL/hr at 07/27/24 1240   piperacillin -tazobactam (ZOSYN )  IV 3.375 g (07/28/24 0334)     LOS: 1 day    Time spent: 35 mins    Darcel Dawley, MD [facility]   If 7PM-7AM, please contact night-coverage

## 2024-07-28 NOTE — Telephone Encounter (Signed)
 Pharmacy Patient Advocate Encounter  Received notification from [facility] Medicaid that Prior Authorization for FreeStyle Libre 3 Plus Sensor  has been DENIED.  Full denial letter will be uploaded to the media tab. See denial reason below.   PA #/Case ID/Reference #: [id number]

## 2024-07-28 NOTE — Anesthesia Procedure Notes (Signed)
 Procedure Name: Intubation Date/Time: 07/28/2024 3:12 PM  Performed by: Cindie Donald CROME, CRNAPre-anesthesia Checklist: Patient identified, Emergency Drugs available, Suction available and Patient being monitored Patient Re-evaluated:Patient Re-evaluated prior to induction Oxygen Delivery Method: Circle System Utilized Preoxygenation: Pre-oxygenation with 100% oxygen Induction Type: IV induction, Rapid sequence and Cricoid Pressure applied Laryngoscope Size: Mac and 3 Grade View: Grade I Tube type: Oral Tube size: 7.0 mm Number of attempts: 1 Airway Equipment and Method: Stylet Placement Confirmation: ETT inserted through vocal cords under direct vision, positive ETCO2 and breath sounds checked- equal and bilateral Secured at: 22 cm Tube secured with: Tape Dental Injury: Teeth and Oropharynx as per pre-operative assessment

## 2024-07-28 NOTE — Addendum Note (Signed)
 Addended by: GLENNON ALMARIE POUR on: 07/28/2024 07:49 AM   Modules accepted: Orders

## 2024-07-29 ENCOUNTER — Other Ambulatory Visit: Payer: Self-pay

## 2024-07-29 ENCOUNTER — Encounter (HOSPITAL_COMMUNITY): Payer: Self-pay | Admitting: Obstetrics and Gynecology

## 2024-07-29 ENCOUNTER — Observation Stay (HOSPITAL_COMMUNITY)

## 2024-07-29 ENCOUNTER — Ambulatory Visit: Payer: Self-pay | Admitting: Obstetrics and Gynecology

## 2024-07-29 DIAGNOSIS — R1030 Lower abdominal pain, unspecified: Secondary | ICD-10-CM

## 2024-07-29 DIAGNOSIS — B999 Unspecified infectious disease: Secondary | ICD-10-CM

## 2024-07-29 DIAGNOSIS — R109 Unspecified abdominal pain: Secondary | ICD-10-CM | POA: Diagnosis not present

## 2024-07-29 LAB — COMPREHENSIVE METABOLIC PANEL WITH GFR
ALT: 23 U/L (ref 0–44)
AST: 13 U/L — ABNORMAL LOW (ref 15–41)
Albumin: 2.8 g/dL — ABNORMAL LOW (ref 3.5–5.0)
Alkaline Phosphatase: 29 U/L — ABNORMAL LOW (ref 38–126)
Anion gap: 10 (ref 5–15)
BUN: 9 mg/dL (ref 6–20)
CO2: 22 mmol/L (ref 22–32)
Calcium: 8.3 mg/dL — ABNORMAL LOW (ref 8.9–10.3)
Chloride: 106 mmol/L (ref 98–111)
Creatinine, Ser: 0.65 mg/dL (ref 0.44–1.00)
GFR, Estimated: >60 mL/min (ref >60)
Glucose, Bld: 193 mg/dL — ABNORMAL HIGH (ref 70–99)
Potassium: 3.4 mmol/L — ABNORMAL LOW (ref 3.5–5.1)
Sodium: 138 mmol/L (ref 135–145)
Total Bilirubin: 0.8 mg/dL (ref 0.0–1.2)
Total Protein: 6.2 g/dL — ABNORMAL LOW (ref 6.5–8.1)

## 2024-07-29 LAB — CBC
HCT: 31.4 % — ABNORMAL LOW (ref 36.0–46.0)
Hemoglobin: 10.9 g/dL — ABNORMAL LOW (ref 12.0–15.0)
MCH: 29.5 pg (ref 26.0–34.0)
MCHC: 34.7 g/dL (ref 30.0–36.0)
MCV: 84.9 fL (ref 80.0–100.0)
Platelets: 263 K/uL (ref 150–400)
RBC: 3.7 MIL/uL — ABNORMAL LOW (ref 3.87–5.11)
RDW: 12 % (ref 11.5–15.5)
WBC: 9.7 K/uL (ref 4.0–10.5)
nRBC: 0 % (ref 0.0–0.2)

## 2024-07-29 LAB — GLUCOSE, CAPILLARY
Glucose-Capillary: 155 mg/dL — ABNORMAL HIGH (ref 70–99)
Glucose-Capillary: 150 mg/dL — ABNORMAL HIGH (ref 70–99)
Glucose-Capillary: 124 mg/dL — ABNORMAL HIGH (ref 70–99)
Glucose-Capillary: 122 mg/dL — ABNORMAL HIGH (ref 70–99)

## 2024-07-29 LAB — URINE CULTURE: Culture: >=100000 — AB

## 2024-07-29 MED ORDER — LEVOFLOXACIN 750 MG PO TABS: 750.0000 mg | ORAL_TABLET | Freq: Every day | ORAL | 0 refills | Status: DC

## 2024-07-29 MED ORDER — ADULT MULTIVITAMIN W/MINERALS CH: 1.0000 | ORAL_TABLET | Freq: Every day | ORAL | 3 refills | Status: DC

## 2024-07-29 MED ORDER — BOOST / RESOURCE BREEZE PO LIQD CUSTOM
1.0000 | Freq: Three times a day (TID) | ORAL | Status: DC
  Filled 2024-07-29 (×3): qty 1

## 2024-07-29 MED ORDER — SODIUM CHLORIDE 0.9 % IV SOLN: 8.0000 mg | Freq: Three times a day (TID) | INTRAVENOUS | Status: DC | PRN

## 2024-07-29 MED ORDER — UNJURY VANILLA POWDER
2.0000 [oz_av] | Freq: Four times a day (QID) | ORAL | Status: DC
  Filled 2024-07-29 (×2): qty 27

## 2024-07-29 MED ORDER — PROSOURCE PLUS PO LIQD
30.0000 mL | Freq: Two times a day (BID) | ORAL | Status: DC
  Administered 2024-07-29: 30 mL via ORAL
  Filled 2024-07-29 (×2): qty 30

## 2024-07-29 MED ORDER — ALBUMIN HUMAN 25 % IV SOLN
12.5000 g | Freq: Once | INTRAVENOUS | Status: AC
  Administered 2024-07-29: 12.5 g via INTRAVENOUS
  Filled 2024-07-29: qty 50

## 2024-07-29 MED ORDER — ESTRADIOL 0.1 MG/GM VA CREA
1.0000 | TOPICAL_CREAM | Freq: Every day | VAGINAL | Status: DC
  Administered 2024-07-29 – 2024-08-01 (×4): 1 via VAGINAL
  Filled 2024-07-29 (×3): qty 42.5

## 2024-07-29 MED ORDER — ENSURE PLUS HIGH PROTEIN PO LIQD
237.0000 mL | Freq: Three times a day (TID) | ORAL | Status: DC
  Administered 2024-07-29: 237 mL via ORAL
  Filled 2024-07-29 (×3): qty 237

## 2024-07-29 MED ORDER — METRONIDAZOLE 500 MG PO TABS: 500.0000 mg | ORAL_TABLET | Freq: Three times a day (TID) | ORAL | 0 refills | Status: DC

## 2024-07-29 MED ORDER — METRONIDAZOLE 500 MG/100ML IV SOLN: 500.0000 mg | Freq: Two times a day (BID) | INTRAVENOUS | Status: DC

## 2024-07-29 MED ORDER — INSULIN GLARGINE-YFGN 100 UNIT/ML ~~LOC~~ SOLN: 16.0000 [IU] | Freq: Every day | SUBCUTANEOUS | 11 refills | Status: DC

## 2024-07-29 MED ORDER — POLYETHYLENE GLYCOL 3350 17 G PO PACK
17.0000 g | PACK | Freq: Every day | ORAL | 0 refills | Status: AC | PRN
Start: 2024-07-29 — End: ?

## 2024-07-29 MED ORDER — FAMOTIDINE 40 MG PO TABS: 40.0000 mg | ORAL_TABLET | Freq: Two times a day (BID) | ORAL | 0 refills | Status: DC

## 2024-07-29 MED ORDER — LEVOFLOXACIN 750 MG PO TABS
750.0000 mg | ORAL_TABLET | Freq: Every day | ORAL | Status: DC
  Administered 2024-07-29: 750 mg via ORAL
  Filled 2024-07-29: qty 1

## 2024-07-29 MED ORDER — POTASSIUM CHLORIDE 10 MEQ/100ML IV SOLN
10.0000 meq | INTRAVENOUS | Status: AC
  Administered 2024-07-29 (×2): 10 meq via INTRAVENOUS
  Filled 2024-07-29 (×2): qty 100

## 2024-07-29 MED ORDER — GABAPENTIN 300 MG PO CAPS
300.0000 mg | ORAL_CAPSULE | Freq: Three times a day (TID) | ORAL | Status: DC
  Administered 2024-07-30 – 2024-08-01 (×7): 300 mg via ORAL
  Filled 2024-07-29 (×7): qty 1

## 2024-07-29 MED ORDER — METOCLOPRAMIDE HCL 10 MG PO TABS
10.0000 mg | ORAL_TABLET | Freq: Three times a day (TID) | ORAL | Status: DC
  Administered 2024-07-29: 10 mg via ORAL
  Filled 2024-07-29: qty 1

## 2024-07-29 MED ORDER — SULFAMETHOXAZOLE-TRIMETHOPRIM 800-160 MG PO TABS: 1.0000 | ORAL_TABLET | Freq: Two times a day (BID) | ORAL | 0 refills | Status: DC

## 2024-07-29 MED ORDER — SIMETHICONE 80 MG PO CHEW: 80.0000 mg | CHEWABLE_TABLET | Freq: Four times a day (QID) | ORAL | 0 refills | Status: DC | PRN

## 2024-07-29 MED ORDER — METOCLOPRAMIDE HCL 5 MG/ML IJ SOLN
10.0000 mg | Freq: Three times a day (TID) | INTRAMUSCULAR | Status: DC
  Administered 2024-07-29 – 2024-08-01 (×8): 10 mg via INTRAVENOUS
  Filled 2024-07-29 (×8): qty 2

## 2024-07-29 MED ORDER — FLEET ENEMA RE ENEM
1.0000 | ENEMA | Freq: Once | RECTAL | Status: AC
  Administered 2024-07-30: 1 via RECTAL

## 2024-07-29 MED ORDER — PROMETHAZINE HCL 12.5 MG RE SUPP: 12.5000 mg | Freq: Four times a day (QID) | RECTAL | 0 refills | Status: DC | PRN

## 2024-07-29 MED ORDER — SULFAMETHOXAZOLE-TRIMETHOPRIM 800-160 MG PO TABS
1.0000 | ORAL_TABLET | Freq: Two times a day (BID) | ORAL | Status: DC
  Administered 2024-07-29: 1 via ORAL
  Filled 2024-07-29: qty 1

## 2024-07-29 MED ORDER — METRONIDAZOLE 500 MG PO TABS
500.0000 mg | ORAL_TABLET | Freq: Three times a day (TID) | ORAL | Status: DC
  Administered 2024-07-29: 500 mg via ORAL
  Filled 2024-07-29: qty 1

## 2024-07-29 MED ORDER — DOCUSATE SODIUM 100 MG PO CAPS: 100.0000 mg | ORAL_CAPSULE | Freq: Two times a day (BID) | ORAL | 0 refills | Status: DC

## 2024-07-29 MED ORDER — PHENOL 1.4 % MT LIQD
1.0000 | OROMUCOSAL | Status: DC | PRN
  Filled 2024-07-29: qty 177

## 2024-07-29 MED ORDER — ESTRADIOL 0.1 MG/GM VA CREA: 1.0000 | TOPICAL_CREAM | Freq: Every day | VAGINAL | 12 refills | Status: DC

## 2024-07-29 MED ORDER — ONDANSETRON HCL 4 MG PO TABS
4.0000 mg | ORAL_TABLET | Freq: Four times a day (QID) | ORAL | Status: DC | PRN
  Administered 2024-07-29: 4 mg via ORAL
  Filled 2024-07-29: qty 1

## 2024-07-29 MED ORDER — METRONIDAZOLE 500 MG/100ML IV SOLN
500.0000 mg | Freq: Two times a day (BID) | INTRAVENOUS | Status: DC
  Administered 2024-07-29 – 2024-08-01 (×6): 500 mg via INTRAVENOUS
  Filled 2024-07-29 (×7): qty 100

## 2024-07-29 MED ORDER — METRONIDAZOLE 500 MG PO TABS: 500.0000 mg | ORAL_TABLET | Freq: Two times a day (BID) | ORAL | Status: DC

## 2024-07-29 MED ORDER — PIPERACILLIN-TAZOBACTAM 3.375 G IVPB
3.3750 g | Freq: Three times a day (TID) | INTRAVENOUS | Status: DC
  Administered 2024-07-30 – 2024-08-01 (×8): 3.375 g via INTRAVENOUS
  Filled 2024-07-29 (×9): qty 50

## 2024-07-29 MED ORDER — IOHEXOL 9 MG/ML PO SOLN: 500.0000 mL | ORAL | Status: AC

## 2024-07-29 MED ORDER — SODIUM CHLORIDE 0.9 % IV SOLN
8.0000 mg | Freq: Three times a day (TID) | INTRAVENOUS | Status: DC | PRN
  Administered 2024-07-30: 8 mg via INTRAVENOUS
  Filled 2024-07-29: qty 4

## 2024-07-29 MED ORDER — ADULT MULTIVITAMIN W/MINERALS CH
1.0000 | ORAL_TABLET | Freq: Every day | ORAL | Status: DC
  Filled 2024-07-29 (×2): qty 1

## 2024-07-29 MED ORDER — FAMOTIDINE IN NACL 20-0.9 MG/50ML-% IV SOLN
20.0000 mg | Freq: Two times a day (BID) | INTRAVENOUS | Status: DC
  Administered 2024-07-29 – 2024-08-01 (×6): 20 mg via INTRAVENOUS
  Filled 2024-07-29 (×7): qty 50

## 2024-07-29 MED ORDER — PROMETHAZINE HCL 12.5 MG RE SUPP
12.5000 mg | Freq: Four times a day (QID) | RECTAL | Status: DC | PRN
  Administered 2024-07-29 – 2024-07-31 (×2): 12.5 mg via RECTAL
  Filled 2024-07-29 (×4): qty 1

## 2024-07-29 MED ORDER — ONDANSETRON HCL 4 MG PO TABS: 4.0000 mg | ORAL_TABLET | ORAL | 0 refills | Status: DC | PRN

## 2024-07-29 MED ORDER — LIDOCAINE HCL URETHRAL/MUCOSAL 2 % EX GEL
1.0000 | Freq: Once | CUTANEOUS | Status: DC
  Filled 2024-07-29: qty 6

## 2024-07-29 MED ORDER — IOHEXOL 350 MG/ML SOLN
75.0000 mL | Freq: Once | INTRAVENOUS | Status: AC | PRN
  Administered 2024-07-29: 75 mL via INTRAVENOUS

## 2024-07-29 MED ORDER — FAMOTIDINE 20 MG PO TABS
40.0000 mg | ORAL_TABLET | Freq: Two times a day (BID) | ORAL | Status: DC
  Administered 2024-07-29: 40 mg via ORAL
  Filled 2024-07-29: qty 2

## 2024-07-29 MED ORDER — ONDANSETRON HCL 4 MG PO TABS
4.0000 mg | ORAL_TABLET | ORAL | Status: DC | PRN
  Administered 2024-07-31 – 2024-08-01 (×3): 4 mg via ORAL
  Filled 2024-07-29 (×3): qty 1

## 2024-07-29 MED ORDER — ACETAMINOPHEN 325 MG PO TABS: 650.0000 mg | ORAL_TABLET | ORAL | 0 refills | Status: DC | PRN

## 2024-07-29 MED ORDER — OXYCODONE HCL 5 MG PO TABS: 5.0000 mg | ORAL_TABLET | ORAL | 0 refills | Status: DC | PRN

## 2024-07-29 MED ORDER — IOHEXOL 9 MG/ML PO SOLN: 500.0000 mL | ORAL | Status: DC

## 2024-07-29 MED ORDER — BISACODYL 10 MG RE SUPP: 10.0000 mg | Freq: Every day | RECTAL | 0 refills | Status: DC | PRN

## 2024-07-29 NOTE — Telephone Encounter (Signed)
 Received a PA request for the Estradiol  0.1MG /GM. For me to complete the request, I need to have the DX code.   Can you please provide that information to me please.   I need a primary Dx and a secondary dx.   Geni   Key [phone]

## 2024-07-29 NOTE — Progress Notes (Signed)
 Patient seen at bedside. Po pills stayed down but with persistent dry heaves. Tried boost and this came up. To return to IV medications May need NG tube BS normal, reports passing gas. To consult general surgery and have them follow and help manage for persistent N/C To return to zosyn  tomorrow. Levofloxacin taking this AM with flagyl .  To return to IV with this. Dr. Glennon

## 2024-07-29 NOTE — Progress Notes (Signed)
 GYN PO NOTE  Patient is feeling better. Pain is still present. Reduced dry heaves, but still having. Can have a conversation without the dry heaves.  Feels like she can tolerate oral boost and would like to try for discharge today. No fevers no VB  Glucose: 193, 229,340   Temp:  [97.9 F (36.6 C)-99.5 F (37.5 C)] 97.9 F (36.6 C) (08/01 0840) Pulse Rate:  [89-102] 94 (08/01 0840) Resp:  [8-20] 20 (08/01 0840) BP: (124-164)/(69-90) 149/73 (08/01 0840) SpO2:  [93 %-99 %] 99 % (08/01 0840)   Today's Vitals   07/29/24 0557 07/29/24 0828 07/29/24 0837 07/29/24 0840  BP:    (!) 149/73  Pulse:    94  Resp:    20  Temp:    97.9 F (36.6 C)  TempSrc:    Oral  SpO2:    99%  Weight:      Height:      PainSc: 5  5  5      Body mass index is 27.46 kg/m.   CBC    Component Value Date/Time   WBC 9.7 07/29/2024 0524   RBC 3.70 (L) 07/29/2024 0524   HGB 10.9 (L) 07/29/2024 0524   HCT 31.4 (L) 07/29/2024 0524   PLT 263 07/29/2024 0524   MCV 84.9 07/29/2024 0524   MCH 29.5 07/29/2024 0524   MCHC 34.7 07/29/2024 0524   RDW 12.0 07/29/2024 0524   LYMPHSABS 2.0 01/01/2023 1644   MONOABS 0.5 01/01/2023 1644   EOSABS 0.2 01/01/2023 1644   BASOSABS 0.1 01/01/2023 1644    AAOX3, NAD No resp distress Abdomen: soft, approp tender, incision I/c/d No c/c/e  A/p POD #1 repair of vaginal cuff, operative laparoscopy, cystoscopy, h/o RLH  6/4  Will try to transition to oral medications and food today. Schedule oral reglan  and prn iv zofran  Hard stool noted large colon yesterday in surgery and last BM 3 days ago.  To get fleet enema going and encouraged ambulation today.  Continue IVF until proven diet and can manage oral pills Continue heparin  for DVT prophylaxis DM: Continue lantus  and SSI. Patient counseled on importance of good fsbs control.  She will follow with me weekly in the clinic and bring her fsbs Shower today Will try for PM discharge but will be based on if she can  tolerate her diet and medications  Dr. Glennon

## 2024-07-29 NOTE — Progress Notes (Signed)
 [facility]  Date of Admission:  07/27/2024   Abx: 8/1-c levo/flagyl    7/30-8/1 piptazo  A/p   43 yo female s/p recent hysterectomy admitted 7/30 for vaginal bleeding after early sexual intercourse, and lower abd pain concerning for infection vs trauma related pain   Ct showed intraabd air ?from surgery related vs bladder/bowel injury vs new infection    Patient is going to OR for laparoscopic dx   No bcx obtained Ucx gnr   Denies risk std   Discussed with dr Glennon; some phlegmon on bowels. No further plan for more surgery Patient said she is feeling better  Cx not obtained intra-op    Recs: -if taking po change abx to levo/flagyl  and can finish 7 more days abx post-op -maintain standard isolation precaution -id will sign off -discussed with primary team           Principal Problem:   Abdominal pain Active Problems:   Type 2 diabetes mellitus with other specified complication (HCC)   Vaginal bleeding   Surgical site infection   Vaginal cuff dehiscence, initial encounter   No Known Allergies  Scheduled Meds:  (feeding supplement) PROSource Plus  30 mL Oral BID BM   calcium  carbonate  2 tablet Oral Q6H   docusate sodium   100 mg Oral BID   estradiol   1 Applicatorful Vaginal Daily   famotidine   40 mg Oral BID   feeding supplement  237 mL Oral TID with meals   heparin  injection (subcutaneous)  5,000 Units Subcutaneous Q8H   ibuprofen   600 mg Oral Q6H   insulin  aspart  0-15 Units Subcutaneous TID WC   insulin  aspart  0-5 Units Subcutaneous QHS   insulin  glargine-yfgn  16 Units Subcutaneous Daily   lactobacillus  1 g Oral TID WC   levofloxacin  750 mg Oral Daily   metoCLOPramide   10 mg Oral TID AC   metroNIDAZOLE   500 mg Oral Q12H   multivitamin with minerals  1 tablet Oral Daily   scopolamine   1 patch Transdermal Once   sodium phosphate   1 enema Rectal Once   Continuous Infusions:  albumin human      ondansetron  (ZOFRAN ) IV     PRN Meds:.acetaminophen , bisacodyl , HYDROmorphone  (DILAUDID ) injection, ondansetron  **OR** ondansetron  (ZOFRAN ) IV, oxyCODONE , polyethylene glycol, promethazine , simethicone    SUBJECTIVE: Improve pain Taking pills ok but appetite not fully back No f/c    Review of Systems: ROS All other ROS was negative, except mentioned above     OBJECTIVE: Vitals:   07/29/24 0400 07/29/24 0401 07/29/24 0840 07/29/24 1219  BP:  (!) 150/94 (!) 149/73 126/64  Pulse:  (!) 108 94 84  Resp: 19  20 19   Temp: 98.4 F (36.9 C)  97.9 F (36.6 C) 98 F (36.7 C)  TempSrc: Oral  Oral Oral  SpO2: 99%  99% 99%  Weight:      Height:       Body mass index is 27.46 kg/m.  Physical Exam  No distress, conversant; family by bedside Normocephalic; per/conj clear Normal respiratory effort Abd soft Skin no rash Ext no edema Neuro -- nonfocal   Lab Results Lab Results  Component Value Date   WBC 9.7 07/29/2024   HGB 10.9 (L) 07/29/2024   HCT 31.4 (L) 07/29/2024   MCV 84.9 07/29/2024   PLT 263 07/29/2024    Lab Results  Component Value Date   CREATININE 0.65 07/29/2024   BUN 9  07/29/2024   NA 138 07/29/2024   K 3.4 (L) 07/29/2024   CL 106 07/29/2024   CO2 22 07/29/2024    Lab Results  Component Value Date   ALT 23 07/29/2024   AST 13 (L) 07/29/2024   ALKPHOS 29 (L) 07/29/2024   BILITOT 0.8 07/29/2024      Microbiology: Recent Results (from the past 240 hours)  Urine Culture     Status: Abnormal   Collection Time: 07/27/24 11:35 AM   Specimen: Urine, Random  Result Value Ref Range Status   Specimen Description URINE, RANDOM  Final   Special Requests   Final    NONE Reflexed from [id number] Performed at [facility], [address], [place], [place] [address]    Culture >=100,000 COLONIES/mL ESCHERICHIA COLI (A)  Final   Report Status 07/29/2024 FINAL  Final   Organism ID, Bacteria ESCHERICHIA COLI (A)  Final      Susceptibility    Escherichia coli - MIC*    AMPICILLIN 8 SENSITIVE Sensitive     CEFAZOLIN  <=4 SENSITIVE Sensitive     CEFEPIME <=0.12 SENSITIVE Sensitive     CEFTRIAXONE  <=0.25 SENSITIVE Sensitive     CIPROFLOXACIN  <=0.25 SENSITIVE Sensitive     GENTAMICIN <=1 SENSITIVE Sensitive     IMIPENEM 0.5 SENSITIVE Sensitive     NITROFURANTOIN  <=16 SENSITIVE Sensitive     TRIMETH /SULFA  <=20 SENSITIVE Sensitive     AMPICILLIN/SULBACTAM 4 SENSITIVE Sensitive     PIP/TAZO <=4 SENSITIVE Sensitive ug/mL    * >=100,000 COLONIES/mL ESCHERICHIA COLI  SureSwab Advanced Vaginitis Plus,TMA     Status: Abnormal   Collection Time: 07/27/24 11:52 AM   Specimen: Genital  Result Value Ref Range Status   SURESWAB(R) ADV BACTERIAL VAGINOSIS(BV),TMA POSITIVE (A) NEGATIVE Final   CANDIDA SPECIES NOT DETECTED NOT DETECTED Final   Candida glabrata DETECTED (A) NOT DETECTED Final    Comment: . C. glabrata, which is responsible for the majority of non-albicans CV in the [place] may have decreased  susceptibility to standard antimycotic therapeutic intervention compared to C. albicans.    TRICHOMONAS VAGINALIS (TV),TMA NOT DETECTED NOT DETECTED Final   C. trachomatis RNA, TMA NOT DETECTED NOT DETECTED Final   N. gonorrhoeae RNA, TMA NOT DETECTED NOT DETECTED Final    Comment: . Candida species C. albicans, C. tropicalis, C. parapsilosis, and/or C. dubliniensis can be detected, but not differentiated, in the Candida spp. result.  For additional information, please refer to https://education.[employer].com/faq/FAQ154 (This link is being provided for information/ educational purposes only.)      Serology:   Imaging: If present, new imagings (plain films, ct scans, and mri) have been personally visualized and interpreted; radiology reports have been reviewed. Decision making incorporated into the Impression / Recommendations.  07/28/24 abd pelv ct 1. Evidence of recent hysterectomy. Small amount of fluid and air over  the surgical site. No definite abscess. Mild to moderate free peritoneal air without definite evidence of bowel perforation. Minimal nonspecific wall thickening of the rectosigmoid colon immediately adjacent the surgical site. Origin of free peritoneal air is inconclusive, although may be related to the hysterectomy surgical site. 2. Small amount of air over the nondependent portion of the bladder which may be due to recent instrumentation versus fistula. 3. Stable bilateral renal cysts. 4. Aortic atherosclerosis.   Constance ONEIDA Passer, MD [facility] [phone] pager    07/29/2024, 12:29 PM

## 2024-07-29 NOTE — Progress Notes (Signed)
 Initial Nutrition Assessment  DOCUMENTATION CODES:   Not applicable  INTERVENTION:  Will discontinue oral nutrition supplements at this time as pt has now been made NPO in setting of nausea with intractable dry heaves. Will monitor plan and diet advancement.  Pending findings of x-ray, if diet able to be advanced, recommend providing Ensure Plus High Protein po TID with meals, each supplement provides 350 kcal and 20 grams of protein.  NUTRITION DIAGNOSIS:   Inadequate oral intake related to nausea, inability to eat as evidenced by NPO status.  GOAL:   Patient will meet greater than or equal to 90% of their needs  MONITOR:   Diet advancement, PO intake, Supplement acceptance, Labs, Weight trends, Skin, I & O's  REASON FOR ASSESSMENT:   Other (Comment) (verbal consult to assess)    ASSESSMENT:   43 year old female with PMHx of type 2 DM, fatty liver, adjustment disorder who is s/p lower robotic assisted laparoscopic hysterectomy with bilateral salpingectomy and cystoscopy on 6/4, admitted for further evaluation after development of abdominal pain and spotting after early intercourse. Now s/p laparoscopy with repair of vaginal cuff vaginally and laparoscopically, cystoscopy, and abdominal irrigation on 07/28/24.  RD requested to assist with ONS recommendations as supplements ordered were not all available on-site Jean). Earlier in the day pt ordered for soft diet with nectar-thick liquids. Per discussion with RN, pt with no difficulty with swallowing and tolerating thin liquids. Team adjusted supplement orders to Ensure Plus High Protein po TID with meals and PROSource Plus po BID between meals.  Unable to meet with patient at bedside after 2 attempts. Pt has had nausea and significant dry heaving. Upon second attempt to see patient, she was being seen by general surgery and then taken for stat abdominal x-ray. Patient has now been made NPO. Noted pt has been having nausea. Now with  intractable dry heaving today. Unable to obtain nutrition history from patient, so unsure of baseline nutritional status at this time. Patient may be at risk for acute malnutrition.  Per review of weight history in chart pt was 72.6 kg on 06/01/24 (may be stated weight as that is exactly 160 lbs). Noted wt of 74.4 kg on 06/06/24. Current weight on admission 72.6 kg (may also be a stated wt).  Medications reviewed and include: PROSource Plus po BID, calcium  carbonate 2 tablets every 6 hours, Colace 100 mg BID, Ensure Plus High Protein po TID with meals, Novolog  0-15 units TID with meals, Novolot 0-5 units at bedtime, Semglee  16 units daily, lactobacillus, Reglan  10 mg TID before meals, MVI daily, scopolamine  patch, famotidine , Flagyl , Zofran , Zosyn   Labs reviewed: CBG 150-155, Potassium 3.4  UOP: 1750 mL (1 mL/kg/hr) + 1 occurrence unmeasured UOP in previous 24 hours  I/O: +1337.1 mL since admission  NUTRITION - FOCUSED PHYSICAL EXAM:  Unable to complete at this time as RD has been unable to meet with pt today  Diet Order:   Diet Order             Diet NPO time specified  Diet effective now                   EDUCATION NEEDS:   Not appropriate for education at this time (Intractable dry heaving)  Skin:  Skin Assessment: Skin Integrity Issues: Skin Integrity Issues:: Incisions Incisions: surgical incision and 3 port sites  Last BM:  07/26/2024 per chart  Height:   Ht Readings from Last 1 Encounters:  07/27/24 5' 4 (1.626  m)   Weight:   Wt Readings from Last 1 Encounters:  07/27/24 72.6 kg   BMI:  Body mass index is 27.46 kg/m.  Estimated Nutritional Needs:   Kcal:  1800-2000  Protein:  100-110 grams  Fluid:  1.8-2 L/day  Damien Myrna Pyo, MS, RD, LDN, CNSC If unable to reach, please contact NICU RD secure chat group between 7 am-3 pm daily

## 2024-07-29 NOTE — Progress Notes (Signed)
 Patient seen at bedside Am pills stayed in (flagyl  and levo) But still with iretractable dry heaves Tried boost and could not tolerate To get repeat abdominal xray return to IV antibiotics BS are normal and reports + flatus To consult general surgery to follow and make recommendations ?NGT placement  Dr. Glennon

## 2024-07-29 NOTE — Consult Note (Addendum)
 [facility] [birth year]  [id number].    Requesting MD: Dr. Almarie Carpen Chief Complaint/Reason for Consult: intractable n/v post op  HPI: Linda Carlson is a 43 y.o. female with a history of diabetes who we were asked to see for intractable n/v post operative.   Patient has history of robotic hysterectomy, bilateral salpingectomy, cystoscopy, lysis of adhesions (omentum attached to anterior abdominal wall) on 06/01/24 by Dr. Almarie Rollo Carpen. Patient returned 7/30 after having intercourse and developed abdominal spotting, abdominal pain, n/v. CT A/P with free air there was felt to be related to vaginal cuff leak.  Gynecology admitted the patient.  Patient was started on antibiotics.  TRH and ID was consulted.  Patient was taken to the OR on 7/31 by Dr. Carpen for operative laparoscopy with repair of vaginal cuff vaginally and laparoscopically, cystoscopy, abdominal irrigation with findings of  4cm cuff separation with large bowel adhered to cuff and phlegmon noted on this area that also retracted and kinked the large bowel - this was reduced with blunt probe. Per op note and discussion, no injury of bowel was noted. No obvious area of abscess. Vaginal repair with tight repair noted laparoscopically. However, peritoneum 1cm area to the left was open and repaired laparoscopically. Cystoscopy with normal bladder and paten ureters at the end of the case.   On postop day 1 patient has had intractable nausea and vomiting.  She reports generalized abdominal pain.  Nausea is worse with p.o. intake and she also feels medications may be contributing this but she is not sure which ones.  She is passing flatus.  No BM since admission. Last BM day of or prior to admission. She denies history of gastroparesis or issues with nausea or vomiting prior to admission.  She is currently hemodynamically stable without fever, tachycardia or hypotension.  WBC 9.7. She has an abdominal xray pending.  Primary has ordered reglan  and an enema.   ROS: ROS As above, see hpi   Family History  Problem Relation Age of Onset   Ovarian cancer Mother    Diabetes Mother    Hypertension Mother    Diabetes Father    Heart attack Father    Breast cancer Neg Hx     Past Medical History:  Diagnosis Date   Adenomyosis of uterus    Anemia    DUB (dysfunctional uterine bleeding)    Dysmenorrhea    Fatty liver 2014   History of abnormal cervical Pap smear    01-21-2023  ASCUS/ +HPV:   03-03-2024  ASCUS   History of cervical dysplasia    03-04-2023  s/p colposcopy  LSIL/  CIN 1   Menorrhagia with irregular cycle    Pain, pelvic, female    Seasonal allergic rhinitis    Type 2 diabetes mellitus (HCC)    followed by pcp;    (04-22-2024 pt stated checks blood sugar 2 times weekly fasting,  average 100-200)   Wears contact lenses     Past Surgical History:  Procedure Laterality Date   CESAREAN SECTION     2009  and  2011   CYSTOSCOPY N/A 06/01/2024   Procedure: CYSTOSCOPY;  Surgeon: Carpen Almarie POUR, MD;  Location: [facility] OR;  Service: Gynecology;  Laterality: N/A;   CYSTOSCOPY N/A 07/28/2024   Procedure: CYSTOSCOPY;  Surgeon: Carpen Almarie POUR, MD;  Location: [facility] OR;  Service: Gynecology;  Laterality: N/A;   HYSTERECTOMY, TOTAL, LAPAROSCOPIC, ROBOT-ASSISTED WITH SALPINGECTOMY Bilateral 06/01/2024   Procedure: HYSTERECTOMY, TOTAL, LAPAROSCOPIC, ROBOT-ASSISTED  WITH SALPINGECTOMY;  Surgeon: Glennon Almarie POUR, MD;  Location: [facility] OR;  Service: Gynecology;  Laterality: Bilateral;   LAPAROSCOPIC TUBAL LIGATION Bilateral 2013   LAPAROSCOPY N/A 07/28/2024   Procedure: OPERATIVE LAPAROSCOPY;  Surgeon: Glennon Almarie POUR, MD;  Location: [facility] OR;  Service: Gynecology;  Laterality: N/A;  with vaginal exam   REPAIR VAGINAL CUFF N/A 07/28/2024   Procedure: REPAIR, VAGINAL CUFF;  Surgeon: Glennon Almarie POUR, MD;  Location: [facility] OR;  Service: Gynecology;  Laterality: N/A;    Social History:  reports that  she has never smoked. She has never used smokeless tobacco. She reports that she does not currently use alcohol. She reports that she does not use drugs.  Allergies: No Known Allergies  Medications Prior to Admission  Medication Sig Dispense Refill   acetaminophen  (TYLENOL ) 500 MG tablet Take 500 mg by mouth every 6 (six) hours as needed for moderate pain (pain score 4-6).     fluticasone  (FLONASE ) 50 MCG/ACT nasal spray Place 1 spray into both nostrils daily. 15.8 mL 0   glipiZIDE -metformin  (METAGLIP ) 2.5-500 MG tablet Take 1 tablet by mouth 2 (two) times daily before a meal. (Patient not taking: Reported on 07/27/2024) 60 tablet 3   ibuprofen  (ADVIL ) 200 MG tablet Take 400 mg by mouth every 6 (six) hours as needed.     ibuprofen  (ADVIL ) 800 MG tablet Take 1 tablet (800 mg total) by mouth every 8 (eight) hours as needed. 30 tablet 1   insulin  glargine (LANTUS  SOLOSTAR) 100 UNIT/ML Solostar Pen Inject 10 Units into the skin daily. 15 mL 1   Insulin  Pen Needle 30G X 5 MM MISC Fill needles for Lantus  Solostar Pens 100 each 3   metFORMIN  (GLUCOPHAGE ) 500 MG tablet Take 1 tablet by mouth 2 (two) times daily.     metoCLOPramide  (REGLAN ) 10 MG tablet Take 1 tablet (10 mg total) by mouth every 8 (eight) hours as needed for nausea. (Patient not taking: Reported on 07/27/2024) 10 tablet 0   Multiple Vitamin (MULTIVITAMIN) tablet Take 1 tablet by mouth daily.       Physical Exam: Blood pressure 126/64, pulse 84, temperature 98 F (36.7 C), temperature source Oral, resp. rate 19, height 5' 4 (1.626 m), weight 72.6 kg, last menstrual period 05/11/2024, SpO2 99%. Gen:  Alert, NAD, pleasant Card:  Reg Pulm:  Rate and effort normal Abd: Soft, mild to moderate distension, diffuse abdominal ttp without rigidity or guarding, she does have some BS. Incisions with glue intact appears well and are without drainage, bleeding, or signs of infection    Results for orders placed or performed during the hospital  encounter of 07/27/24 (from the past 48 hours)  Glucose, capillary     Status: Abnormal   Collection Time: 07/27/24  3:41 PM  Result Value Ref Range   Glucose-Capillary 303 (H) 70 - 99 mg/dL    Comment: Glucose reference range applies only to samples taken after fasting for at least 8 hours.  Glucose, capillary     Status: Abnormal   Collection Time: 07/27/24  8:12 PM  Result Value Ref Range   Glucose-Capillary 252 (H) 70 - 99 mg/dL    Comment: Glucose reference range applies only to samples taken after fasting for at least 8 hours.  Glucose, capillary     Status: Abnormal   Collection Time: 07/27/24  9:27 PM  Result Value Ref Range   Glucose-Capillary 229 (H) 70 - 99 mg/dL    Comment: Glucose reference range applies only to samples  taken after fasting for at least 8 hours.  Glucose, capillary     Status: Abnormal   Collection Time: 07/28/24 12:23 AM  Result Value Ref Range   Glucose-Capillary 279 (H) 70 - 99 mg/dL    Comment: Glucose reference range applies only to samples taken after fasting for at least 8 hours.  CBC     Status: Abnormal   Collection Time: 07/28/24  5:25 AM  Result Value Ref Range   WBC 8.0 4.0 - 10.5 K/uL   RBC 3.84 (L) 3.87 - 5.11 MIL/uL   Hemoglobin 11.3 (L) 12.0 - 15.0 g/dL   HCT 66.8 (L) 63.9 - 53.9 %   MCV 86.2 80.0 - 100.0 fL   MCH 29.4 26.0 - 34.0 pg   MCHC 34.1 30.0 - 36.0 g/dL   RDW 87.6 88.4 - 84.4 %   Platelets 269 150 - 400 K/uL   nRBC 0.0 0.0 - 0.2 %    Comment: Performed at [facility], [address] [place], [place] [address]  Comprehensive metabolic panel     Status: Abnormal   Collection Time: 07/28/24  5:25 AM  Result Value Ref Range   Sodium 137 135 - 145 mmol/L   Potassium 3.6 3.5 - 5.1 mmol/L   Chloride 105 98 - 111 mmol/L   CO2 24 22 - 32 mmol/L   Glucose, Bld 229 (H) 70 - 99 mg/dL    Comment: Glucose reference range applies only to samples taken after fasting for at least 8 hours.   BUN 9 6 - 20 mg/dL   Creatinine, Ser  9.45 0.44 - 1.00 mg/dL   Calcium  8.6 (L) 8.9 - 10.3 mg/dL   Total Protein 6.5 6.5 - 8.1 g/dL   Albumin 3.1 (L) 3.5 - 5.0 g/dL   AST 12 (L) 15 - 41 U/L   ALT 24 0 - 44 U/L   Alkaline Phosphatase 33 (L) 38 - 126 U/L   Total Bilirubin 0.9 0.0 - 1.2 mg/dL   GFR, Estimated >39 >39 mL/min    Comment: (NOTE) Calculated using the CKD-EPI Creatinine Equation (2021)    Anion gap 8 5 - 15    Comment: Performed at [facility], [address] [place], [place] [address]  Glucose, capillary     Status: Abnormal   Collection Time: 07/28/24  8:51 AM  Result Value Ref Range   Glucose-Capillary 243 (H) 70 - 99 mg/dL    Comment: Glucose reference range applies only to samples taken after fasting for at least 8 hours.  Type and screen  [facility]     Status: None   Collection Time: 07/28/24 10:25 AM  Result Value Ref Range   ABO/RH(D) A POS    Antibody Screen NEG    Sample Expiration      07/31/2024,2359 Performed at [facility], [address] [place], [place] [address]   Glucose, capillary     Status: Abnormal   Collection Time: 07/28/24 12:29 PM  Result Value Ref Range   Glucose-Capillary 250 (H) 70 - 99 mg/dL    Comment: Glucose reference range applies only to samples taken after fasting for at least 8 hours.  Glucose, capillary     Status: Abnormal   Collection Time: 07/28/24  2:32 PM  Result Value Ref Range   Glucose-Capillary 241 (H) 70 - 99 mg/dL    Comment: Glucose reference range applies only to samples taken after fasting for at least 8 hours.  Glucose, capillary     Status: Abnormal   Collection Time: 07/28/24  4:47 PM  Result Value Ref Range   Glucose-Capillary 199 (H) 70 - 99 mg/dL    Comment: Glucose reference range applies only to samples taken after fasting for at least 8 hours.  Glucose, capillary     Status: Abnormal   Collection Time: 07/28/24  5:58 PM  Result Value Ref Range   Glucose-Capillary 191 (H) 70 - 99 mg/dL    Comment: Glucose  reference range applies only to samples taken after fasting for at least 8 hours.  Glucose, capillary     Status: Abnormal   Collection Time: 07/28/24  9:32 PM  Result Value Ref Range   Glucose-Capillary 238 (H) 70 - 99 mg/dL    Comment: Glucose reference range applies only to samples taken after fasting for at least 8 hours.  CBC     Status: Abnormal   Collection Time: 07/29/24  5:24 AM  Result Value Ref Range   WBC 9.7 4.0 - 10.5 K/uL   RBC 3.70 (L) 3.87 - 5.11 MIL/uL   Hemoglobin 10.9 (L) 12.0 - 15.0 g/dL   HCT 68.5 (L) 63.9 - 53.9 %   MCV 84.9 80.0 - 100.0 fL   MCH 29.5 26.0 - 34.0 pg   MCHC 34.7 30.0 - 36.0 g/dL   RDW 87.9 88.4 - 84.4 %   Platelets 263 150 - 400 K/uL   nRBC 0.0 0.0 - 0.2 %    Comment: Performed at [facility], [address], [place], [place] [address]  Comprehensive metabolic panel     Status: Abnormal   Collection Time: 07/29/24  5:24 AM  Result Value Ref Range   Sodium 138 135 - 145 mmol/L   Potassium 3.4 (L) 3.5 - 5.1 mmol/L   Chloride 106 98 - 111 mmol/L   CO2 22 22 - 32 mmol/L   Glucose, Bld 193 (H) 70 - 99 mg/dL    Comment: Glucose reference range applies only to samples taken after fasting for at least 8 hours.   BUN 9 6 - 20 mg/dL   Creatinine, Ser 9.34 0.44 - 1.00 mg/dL   Calcium  8.3 (L) 8.9 - 10.3 mg/dL   Total Protein 6.2 (L) 6.5 - 8.1 g/dL   Albumin 2.8 (L) 3.5 - 5.0 g/dL   AST 13 (L) 15 - 41 U/L   ALT 23 0 - 44 U/L   Alkaline Phosphatase 29 (L) 38 - 126 U/L   Total Bilirubin 0.8 0.0 - 1.2 mg/dL   GFR, Estimated >39 >39 mL/min    Comment: (NOTE) Calculated using the CKD-EPI Creatinine Equation (2021)    Anion gap 10 5 - 15    Comment: Performed at [facility], [address], [place], [place] [address]  Glucose, capillary     Status: Abnormal   Collection Time: 07/29/24  8:49 AM  Result Value Ref Range   Glucose-Capillary 155 (H) 70 - 99 mg/dL    Comment: Glucose reference range applies only to samples taken after  fasting for at least 8 hours.  Glucose, capillary     Status: Abnormal   Collection Time: 07/29/24 12:16 PM  Result Value Ref Range   Glucose-Capillary 150 (H) 70 - 99 mg/dL    Comment: Glucose reference range applies only to samples taken after fasting for at least 8 hours.   CT CYSTOGRAM PELVIS Result Date: 07/28/2024 CLINICAL DATA:  Right lower quadrant abdominal pain. EXAM: CT  CYSTOGRAM (CT PELVIS WITH CONTRAST) TECHNIQUE: Multidetector CT imaging through the pelvis was performed after dilute contrast had been introduced into the bladder for the purposes of performing CT cystography. RADIATION DOSE REDUCTION: This exam was performed according to the departmental dose-optimization program which includes automated exposure control, adjustment of the mA and/or kV according to patient size and/or use of iterative reconstruction technique. CONTRAST:  50mL OMNIPAQUE  IOHEXOL  350 MG/ML SOLN COMPARISON:  July 27, 2024. FINDINGS: Urinary Tract: Foley catheter is noted within urinary bladder. Normal contrast filling of urinary bladder is noted without leakage or extravasation. Small amount of air is noted in nondependent portion of urinary bladder which most likely is due to instrumentation. No filling defects are noted. Bowel:  Unremarkable visualized pelvic bowel loops. Vascular/Lymphatic: No pathologically enlarged lymph nodes. No significant vascular abnormality seen. Reproductive: Status post hysterectomy. Mild amount of stranding and fluid is noted in posterior pelvis consistent with recent hysterectomy. Other:  No hernia is noted. Musculoskeletal: No suspicious bone lesions identified. IMPRESSION: No significant abnormality seen involving the urinary bladder. No extravasation or leakage is noted. Foley catheter is noted. Expected postoperative changes are noted in the posterior pelvis consistent with recent hysterectomy. Electronically Signed   By: Lynwood Landy Raddle M.D.   On: 07/28/2024 12:08   DG Abd 2  Views Result Date: 07/28/2024 CLINICAL DATA:  Nausea and vomiting. Recent history of hysterectomy on 06/01/2024. EXAM: ABDOMEN - 2 VIEW COMPARISON:  CT abdomen/pelvis dated 07/27/2024. FINDINGS: Nonobstructive bowel-gas pattern. Intraperitoneal free air is better visualized on the prior CT abdomen/pelvis dated 07/27/2024. Moderate volume of stool in the right colon. No acute osseous abnormality. IMPRESSION: 1. Intraperitoneal free air is better visualized on the prior CT abdomen/pelvis dated 07/27/2024. 2. Nonobstructive bowel gas pattern. Electronically Signed   By: Harrietta Sherry M.D.   On: 07/28/2024 11:28   CT ABDOMEN PELVIS W CONTRAST Addendum Date: 07/27/2024 ADDENDUM REPORT: 07/27/2024 16:06 ADDENDUM: Critical Value/emergent results were called by telephone at the time of interpretation on 07/27/2024 at 4:06 pm to provider ALMARIE CARPEN , who verbally acknowledged these results. Electronically Signed   By: Toribio Agreste M.D.   On: 07/27/2024 16:06   Result Date: 07/27/2024 CLINICAL DATA:  Generalized abdominal pain. Hysterectomy 06/01/2024. Cystoscopy 06/01/2024. EXAM: CT ABDOMEN AND PELVIS WITH CONTRAST TECHNIQUE: Multidetector CT imaging of the abdomen and pelvis was performed using the standard protocol following bolus administration of intravenous contrast. RADIATION DOSE REDUCTION: This exam was performed according to the departmental dose-optimization program which includes automated exposure control, adjustment of the mA and/or kV according to patient size and/or use of iterative reconstruction technique. CONTRAST:  75mL OMNIPAQUE  IOHEXOL  350 MG/ML SOLN COMPARISON:  07/31/2022 FINDINGS: Lower chest: Heart is normal size.  Visualized lung bases are clear. Hepatobiliary: Liver, gallbladder and biliary tree are normal. Pancreas: Normal. Spleen: Normal. Adrenals/Urinary Tract: Adrenal glands are normal. Kidneys are normal in size without hydronephrosis or nephrolithiasis. Stable bilateral renal  cysts. Ureters are normal. Small amount air over the nondependent portion of the bladder which may be due to recent instrumentation versus fistula. Stomach/Bowel: Stomach and small bowel are normal. Appendix is normal. There is minimal wall thickening of the rectosigmoid colon immediately adjacent the hysterectomy surgical site as the colon is otherwise unremarkable. Mild to moderate free peritoneal air is present. No definite evidence of bowel perforation. Vascular/Lymphatic: Minimal calcified plaque over the abdominal aorta which is normal in caliber. No evidence of adenopathy. Reproductive: Evidence of recent hysterectomy. Small flecks of air over the surgical  site. Small amount of fluid over the surgical site. Adnexal regions are unremarkable. Other: None. Musculoskeletal: No acute findings. IMPRESSION: 1. Evidence of recent hysterectomy. Small amount of fluid and air over the surgical site. No definite abscess. Mild to moderate free peritoneal air without definite evidence of bowel perforation. Minimal nonspecific wall thickening of the rectosigmoid colon immediately adjacent the surgical site. Origin of free peritoneal air is inconclusive, although may be related to the hysterectomy surgical site. 2. Small amount of air over the nondependent portion of the bladder which may be due to recent instrumentation versus fistula. 3. Stable bilateral renal cysts. 4. Aortic atherosclerosis. Aortic Atherosclerosis (ICD10-I70.0). Ordering physician has been paged. Electronically Signed: By: Toribio Agreste M.D. On: 07/27/2024 15:43    Anti-infectives (From admission, onward)    Start     Dose/Rate Route Frequency Ordered Stop   07/29/24 2300  piperacillin -tazobactam (ZOSYN ) IVPB 3.375 g        3.375 g 12.5 mL/hr over 240 Minutes Intravenous Every 8 hours 07/29/24 1403     07/29/24 2200  metroNIDAZOLE  (FLAGYL ) tablet 500 mg  Status:  Discontinued        500 mg Oral Every 12 hours 07/29/24 0955 07/29/24 1345    07/29/24 1445  metroNIDAZOLE  (FLAGYL ) IVPB 500 mg        500 mg 100 mL/hr over 60 Minutes Intravenous Every 12 hours 07/29/24 1345     07/29/24 1045  levofloxacin (LEVAQUIN) tablet 750 mg  Status:  Discontinued        750 mg Oral Daily 07/29/24 0953 07/29/24 1345   07/29/24 1000  sulfamethoxazole -trimethoprim  (BACTRIM  DS) 800-160 MG per tablet 1 tablet  Status:  Discontinued        1 tablet Oral Every 12 hours 07/29/24 0744 07/29/24 0953   07/29/24 0915  metroNIDAZOLE  (FLAGYL ) tablet 500 mg  Status:  Discontinued        500 mg Oral Every 8 hours 07/29/24 0905 07/29/24 0955   07/29/24 0000  sulfamethoxazole -trimethoprim  (BACTRIM  DS) 800-160 MG tablet  Status:  Discontinued        1 tablet Oral Every 12 hours 07/29/24 0933 07/29/24    07/29/24 0000  metroNIDAZOLE  (FLAGYL ) 500 MG tablet        500 mg Oral Every 8 hours 07/29/24 0933 08/12/24 2359   07/29/24 0000  levofloxacin (LEVAQUIN) 750 MG tablet        750 mg Oral Daily 07/29/24 1256     07/28/24 1500  cefazolin  1000 mg in normal saline 1000 mL irrigation         Irrigation  Once 07/28/24 1400 07/28/24 1558   07/27/24 1215  piperacillin -tazobactam (ZOSYN ) IVPB 3.375 g  Status:  Discontinued        3.375 g 100 mL/hr over 30 Minutes Intravenous Every 6 hours 07/27/24 1119 07/27/24 1126   07/27/24 1200  piperacillin -tazobactam (ZOSYN ) IVPB 3.375 g  Status:  Discontinued        3.375 g 12.5 mL/hr over 240 Minutes Intravenous Every 8 hours 07/27/24 1128 07/29/24 0743       Assessment/Plan 43 y.o. female with a history of robotic hysterectomy, BSO 06/01/24 that was admitted on 7/30 with CT findings of free air that was felt to be related to vaginal cuff leak, was started on antibiotics, admitted to GYN and underwent laparoscopic repair of vaginal cuff on 7/31  Per op note and discussion with operating surgeon, during the operation patient was noted to have large bowel adhered to cuff and  phlegmon noted on this area that also retracted and  kinked the large bowel that was reduced with blunt probe with no injury of bowel noted and no obvious area of abscess.  We are asked to see her today for intractable nausea and vomiting. She is currently hemodynamically stable without fever, tachycardia or hypotension.  WBC 9.7.   Recommendations - No indication for emergency surgery from a general surgery standpoint at this time - Primary has ordered Xray. Pending.  - Recommend NG tube for decompression given intractable nausea and vomiting as well to allow for CT with PO contrast via NG tube - CT A/P with PO/IV contrast  - Keep K >=4, Phos >= 3, Mg >= 2 and mobilize for bowel function. Okay to clamp NGT to mobilization.  - We will follow with you  FEN - NPO, NGT to LIWS, IVF per primary  VTE - SCDs, sqh ID - Per ID  I reviewed nursing notes, Consultant (ID) notes, hospitalist notes, last 24 h vitals and pain scores, last 48 h intake and output, last 24 h labs and trends, and last 24 h imaging results.  Ozell CHRISTELLA Shaper, [facility] 07/29/2024, 2:29 PM Please see Amion for pager number during day hours 7:00am-4:30pm

## 2024-07-29 NOTE — Op Note (Signed)
 07/29/2024   [id number]  Anushree Cranshaw       OPERATIVE REPORT   Preop Diagnosis: nausea, vomiting early intercourse, history of RLH, bilateral salpingectomy, cystoscopy on April 03, 2024 Spotting, abdominal pain, pneumoperitoneum, uncontrolled diabetes  Procedure: operative laparoscopy with repair of vaginal cuff vaginally and laparoscopically, cystoscopy, abdominal irrigation   Surgeon: Dr. Almarie Rollo Carpen    Fluids: please see anesthesia report   Complications: None Anesthesia: General    Circulator: Waddell Silvano HERO, RN Relief Circulator: Standley Metta BIRCH, RN; [place], Karrie S, RN Relief Scrub: Gwenn Mardy SQUIBB, RN Scrub Person: Key, Jackolyn FERNS Circulator Assistant: Gwenith Powell PARAS, RN; Morgan, Maggie, CST RN First Assistant: Jackquline Judyann CROME, RN  Findings:  4cm cuff separation, normal ovaries, large bowel adhered to cuff and phlegmon noted on this area that also retracted and kinked the large bowel. This was reduced easily with blunt probe and no injury of bowel was noted. No obvious area of abscess. Vaginal repair with tight repair noted laparoscopically. However, peritoneum 1cm area to the left was open and repaired laparoscopically. Cystoscopy with normal bladder and paten ureters at the end of the case   Estimated blood loss: Minimal <25cc   Specimens: none      PROCEDURE IN DETAIL:  The patient received intravenous antibiotics (zosyn ), which was started for 24 hours before the procedure.  Patient  had sequential compression devices applied to her lower extremities while in the preoperative area.  She was then taken to the operating room where general anesthesia was administered and was found to be adequate.  She was placed in the dorsal lithotomy position, and was prepped and draped in a sterile manner.  A Foley catheter was already inserted into her bladder and attached to constant drainage and a uterine manipulator was then advanced into the  uterus.    After an adequate timeout was performed, attention was then turned to the patient's vagina and a speculum was placed in the vagina for  exam of the vaginal cuff.  A 4cm openening was noted. It appeared the peritoneum was intact behind this. This was irrigated with iodine . The cuff was repaired with interrupted sutures using zero vicryl.  About 10 sutures were used.  Tight closure was noted.  Attention was then turned to the abdomen where a 5mm skin incision was made in the supra umbilical region.  The laparoscope and robotic trocar were carefully introduced into the peritoneal cavity and intraabdominal entry was confirmed on visualization.  Intraperitoneal placement was confirmed by low opening intraabdominal pressure with insufflation of carbon dioxide gas.  Adequate pneumoperitoneum was obtained and the upper abdomen surveyed. The patient was then  placed in steep trendelenburg. A survey of the patient's pelvis and abdomen revealed the findings above.   Bilateral 5 and 8-mm lower quadrant ports were then placed under direct visualization.   The large descending colon was seen to be adhered to vaginal cuff from the inflammatory process and a kink was seen. The bowel was released with the blunt probe and it was brought down to straighten it out. Colon appeared with hard stool as well.  No injury to the bowel was noted.  The cuff was secure and two additional left lateral sutures were placed to cover the peritoneum.  The abdomen was irrigated with a copious amount of saline with ancef .  The operative site was surveyed, and it was found to be hemostatic. The abdomen was desufflated and all trocars were then removed from the patient's abdomen  under direct visualization.  All skin incisions were closed with 4-0 monocryl with a subcuticular stitches/Dermabond. Sponge, lap, instrument count were correct x2.  Patient was taken to recover in stable condition.  The patient was brought to recovery in stable  condition and to continue monitoring and IV antibiotics over night.

## 2024-07-29 NOTE — Progress Notes (Addendum)
 PROGRESS NOTE    Linda Carlson  FMW:[id number] DOB: 21-Jul-1981 DOA: 07/27/2024 PCP: Rilla Baller, MD   Brief Narrative:  This 43 years old female with PMH significant for diabetes and adjustment disorder presented to the OB/GYN with abdominal pain.She is s/p  lower robotic assisted laparoscopic hysterectomy with bilateral salpingectomy and cystoscopy on 6/4. Patient has been having early intercourse for the past week because she read online to wait 6 weeks. After intercourse yesterday she noticed some spotting but no gush of fluid. Today she has had chills, nausea, vomiting.  Patient was admitted for further evaluation.Patient did miss long-acting insulin  dose yesterday which has been resumed.  We were consulted for diabetic management.  Assessment & Plan:   Principal Problem:   Abdominal pain Active Problems:   Type 2 diabetes mellitus with other specified complication (HCC)   Vaginal bleeding   Surgical site infection   Vaginal cuff dehiscence, initial encounter   Abdominal pain: Suspected pelvic abscess/vaginitis: She was seen by OB/GYN  for abdominal pain and spotting after intercourse , She is status post hysterectomy/salpingectomy/cystoscopy. OB/GYN has reiterated the patient the need to wait until cleared by them. Afterward  patient had some spotting and woke this morning with chills, nausea and vomiting.   Concern for infection and possible abscess. Has been started on antibiotics and pain medications by OB/GYN. She is status post laparoscopy with repair of vaginal cuff,  cystoscopy and abdominal irrigation. Continue with management by primary team.   Diabetes Mellitus II: Uncontrolled with Hb A1c 11.5 and CBG around 340 on admission.   Did miss a.m. long-acting insulin  dose;  Continue Semglee  to 16 units daily. Continue regular insulin  sliding scale. Will titrate regimen as needed.   TRH will sign off.  Reconsult if needed.   Antimicrobials:  Anti-infectives  (From admission, onward)    Start     Dose/Rate Route Frequency Ordered Stop   07/29/24 2200  metroNIDAZOLE  (FLAGYL ) tablet 500 mg  Status:  Discontinued        500 mg Oral Every 12 hours 07/29/24 0955 07/29/24 1345   07/29/24 1445  metroNIDAZOLE  (FLAGYL ) IVPB 500 mg        500 mg 100 mL/hr over 60 Minutes Intravenous Every 12 hours 07/29/24 1345     07/29/24 1045  levofloxacin (LEVAQUIN) tablet 750 mg  Status:  Discontinued        750 mg Oral Daily 07/29/24 0953 07/29/24 1345   07/29/24 1000  sulfamethoxazole -trimethoprim  (BACTRIM  DS) 800-160 MG per tablet 1 tablet  Status:  Discontinued        1 tablet Oral Every 12 hours 07/29/24 0744 07/29/24 0953   07/29/24 0915  metroNIDAZOLE  (FLAGYL ) tablet 500 mg  Status:  Discontinued        500 mg Oral Every 8 hours 07/29/24 0905 07/29/24 0955   07/29/24 0000  sulfamethoxazole -trimethoprim  (BACTRIM  DS) 800-160 MG tablet  Status:  Discontinued        1 tablet Oral Every 12 hours 07/29/24 0933 07/29/24    07/29/24 0000  metroNIDAZOLE  (FLAGYL ) 500 MG tablet        500 mg Oral Every 8 hours 07/29/24 0933 08/12/24 2359   07/29/24 0000  levofloxacin (LEVAQUIN) 750 MG tablet        750 mg Oral Daily 07/29/24 1256     07/28/24 1500  cefazolin  1000 mg in normal saline 1000 mL irrigation         Irrigation  Once 07/28/24 1400 07/28/24 1558   07/27/24 1215  piperacillin -tazobactam (ZOSYN ) IVPB 3.375 g  Status:  Discontinued        3.375 g 100 mL/hr over 30 Minutes Intravenous Every 6 hours 07/27/24 1119 07/27/24 1126   07/27/24 1200  piperacillin -tazobactam (ZOSYN ) IVPB 3.375 g  Status:  Discontinued        3.375 g 12.5 mL/hr over 240 Minutes Intravenous Every 8 hours 07/27/24 1128 07/29/24 0743      Subjective: Patient was seen and examined at bedside. Overnight events noted. Patient reports having pain, She underwent laparoscopy and repair of vaginal cuff,  cystoscopy and abdominal irrigation tolerated well. She reports she is going to be discharged  today.  Objective: Vitals:   07/29/24 0400 07/29/24 0401 07/29/24 0840 07/29/24 1219  BP:  (!) 150/94 (!) 149/73 126/64  Pulse:  (!) 108 94 84  Resp: 19  20 19   Temp: 98.4 F (36.9 C)  97.9 F (36.6 C) 98 F (36.7 C)  TempSrc: Oral  Oral Oral  SpO2: 99%  99% 99%  Weight:      Height:        Intake/Output Summary (Last 24 hours) at 07/29/2024 1350 Last data filed at 07/29/2024 0402 Gross per 24 hour  Intake 2878.2 ml  Output 875 ml  Net 2003.2 ml   Filed Weights   07/27/24 1300  Weight: 72.6 kg    Examination:  General exam: Appears calm and comfortable, not in any acute distress. Respiratory system: CTA Bilaterally. Respiratory effort normal. RR 16 Cardiovascular system: S1 & S2 heard, RRR. No JVD, murmurs, rubs, gallops or clicks.  Gastrointestinal system: Abdomen is non distended, soft and non tender.  Normal bowel sounds heard. Central nervous system: Alert and oriented x 3. No focal neurological deficits. Extremities: No edema, no cyanosis, no clubbing. Skin: No rashes, lesions or ulcers Psychiatry: Judgement and insight appear normal. Mood & affect appropriate.     Data Reviewed: I have personally reviewed following labs and imaging studies  CBC: Recent Labs  Lab 07/27/24 1226 07/28/24 0525 07/29/24 0524  WBC 12.8* 8.0 9.7  HGB 12.3 11.3* 10.9*  HCT 35.9* 33.1* 31.4*  MCV 86.5 86.2 84.9  PLT 285 269 263   Basic Metabolic Panel: Recent Labs  Lab 07/27/24 1226 07/28/24 0525 07/29/24 0524  NA 136 137 138  K 3.7 3.6 3.4*  CL 104 105 106  CO2 19* 24 22  GLUCOSE 340* 229* 193*  BUN 10 9 9   CREATININE 0.67 0.54 0.65  CALCIUM  8.9 8.6* 8.3*   GFR: Estimated Creatinine Clearance: 87.7 mL/min (by C-G formula based on SCr of 0.65 mg/dL). Liver Function Tests: Recent Labs  Lab 07/27/24 1226 07/28/24 0525 07/29/24 0524  AST 25 12* 13*  ALT 30 24 23   ALKPHOS 38 33* 29*  BILITOT 1.0 0.9 0.8  PROT 6.9 6.5 6.2*  ALBUMIN 3.7 3.1* 2.8*   No results  for input(s): LIPASE, AMYLASE in the last 168 hours. No results for input(s): AMMONIA in the last 168 hours. Coagulation Profile: No results for input(s): INR, PROTIME in the last 168 hours. Cardiac Enzymes: No results for input(s): CKTOTAL, CKMB, CKMBINDEX, TROPONINI in the last 168 hours. BNP (last 3 results) No results for input(s): PROBNP in the last 8760 hours. HbA1C: Recent Labs    07/27/24 1224  HGBA1C 11.5*   CBG: Recent Labs  Lab 07/28/24 1647 07/28/24 1758 07/28/24 2132 07/29/24 0849 07/29/24 1216  GLUCAP 199* 191* 238* 155* 150*   Lipid Profile: No results for input(s): CHOL, HDL, LDLCALC, TRIG,  CHOLHDL, LDLDIRECT in the last 72 hours. Thyroid Function Tests: No results for input(s): TSH, T4TOTAL, FREET4, T3FREE, THYROIDAB in the last 72 hours. Anemia Panel: No results for input(s): VITAMINB12, FOLATE, FERRITIN, TIBC, IRON, RETICCTPCT in the last 72 hours. Sepsis Labs: No results for input(s): PROCALCITON, LATICACIDVEN in the last 168 hours.  Recent Results (from the past 240 hours)  Urine Culture     Status: Abnormal   Collection Time: 07/27/24 11:35 AM   Specimen: Urine, Random  Result Value Ref Range Status   Specimen Description URINE, RANDOM  Final   Special Requests   Final    NONE Reflexed from [id number] Performed at [facility], [address] [place], [place] [address]    Culture >=100,000 COLONIES/mL ESCHERICHIA COLI (A)  Final   Report Status 07/29/2024 FINAL  Final   Organism ID, Bacteria ESCHERICHIA COLI (A)  Final      Susceptibility   Escherichia coli - MIC*    AMPICILLIN 8 SENSITIVE Sensitive     CEFAZOLIN  <=4 SENSITIVE Sensitive     CEFEPIME <=0.12 SENSITIVE Sensitive     CEFTRIAXONE  <=0.25 SENSITIVE Sensitive     CIPROFLOXACIN  <=0.25 SENSITIVE Sensitive     GENTAMICIN <=1 SENSITIVE Sensitive     IMIPENEM 0.5 SENSITIVE Sensitive     NITROFURANTOIN  <=16 SENSITIVE Sensitive      TRIMETH /SULFA  <=20 SENSITIVE Sensitive     AMPICILLIN/SULBACTAM 4 SENSITIVE Sensitive     PIP/TAZO <=4 SENSITIVE Sensitive ug/mL    * >=100,000 COLONIES/mL ESCHERICHIA COLI  SureSwab Advanced Vaginitis Plus,TMA     Status: Abnormal   Collection Time: 07/27/24 11:52 AM   Specimen: Genital  Result Value Ref Range Status   SURESWAB(R) ADV BACTERIAL VAGINOSIS(BV),TMA POSITIVE (A) NEGATIVE Final   CANDIDA SPECIES NOT DETECTED NOT DETECTED Final   Candida glabrata DETECTED (A) NOT DETECTED Final    Comment: . C. glabrata, which is responsible for the majority of non-albicans CV in the [place] may have decreased  susceptibility to standard antimycotic therapeutic intervention compared to C. albicans.    TRICHOMONAS VAGINALIS (TV),TMA NOT DETECTED NOT DETECTED Final   C. trachomatis RNA, TMA NOT DETECTED NOT DETECTED Final   N. gonorrhoeae RNA, TMA NOT DETECTED NOT DETECTED Final    Comment: . Candida species C. albicans, C. tropicalis, C. parapsilosis, and/or C. dubliniensis can be detected, but not differentiated, in the Candida spp. result.  For additional information, please refer to https://education.[employer].com/faq/FAQ154 (This link is being provided for information/ educational purposes only.)      Radiology Studies: CT CYSTOGRAM PELVIS Result Date: 07/28/2024 CLINICAL DATA:  Right lower quadrant abdominal pain. EXAM: CT CYSTOGRAM (CT PELVIS WITH CONTRAST) TECHNIQUE: Multidetector CT imaging through the pelvis was performed after dilute contrast had been introduced into the bladder for the purposes of performing CT cystography. RADIATION DOSE REDUCTION: This exam was performed according to the departmental dose-optimization program which includes automated exposure control, adjustment of the mA and/or kV according to patient size and/or use of iterative reconstruction technique. CONTRAST:  50mL OMNIPAQUE  IOHEXOL  350 MG/ML SOLN COMPARISON:  July 27, 2024. FINDINGS: Urinary  Tract: Foley catheter is noted within urinary bladder. Normal contrast filling of urinary bladder is noted without leakage or extravasation. Small amount of air is noted in nondependent portion of urinary bladder which most likely is due to instrumentation. No filling defects are noted. Bowel:  Unremarkable visualized pelvic bowel loops. Vascular/Lymphatic: No pathologically enlarged lymph nodes. No significant vascular abnormality seen. Reproductive: Status post hysterectomy. Mild amount  of stranding and fluid is noted in posterior pelvis consistent with recent hysterectomy. Other:  No hernia is noted. Musculoskeletal: No suspicious bone lesions identified. IMPRESSION: No significant abnormality seen involving the urinary bladder. No extravasation or leakage is noted. Foley catheter is noted. Expected postoperative changes are noted in the posterior pelvis consistent with recent hysterectomy. Electronically Signed   By: Lynwood Landy Raddle M.D.   On: 07/28/2024 12:08   DG Abd 2 Views Result Date: 07/28/2024 CLINICAL DATA:  Nausea and vomiting. Recent history of hysterectomy on 06/01/2024. EXAM: ABDOMEN - 2 VIEW COMPARISON:  CT abdomen/pelvis dated 07/27/2024. FINDINGS: Nonobstructive bowel-gas pattern. Intraperitoneal free air is better visualized on the prior CT abdomen/pelvis dated 07/27/2024. Moderate volume of stool in the right colon. No acute osseous abnormality. IMPRESSION: 1. Intraperitoneal free air is better visualized on the prior CT abdomen/pelvis dated 07/27/2024. 2. Nonobstructive bowel gas pattern. Electronically Signed   By: Harrietta Sherry M.D.   On: 07/28/2024 11:28   CT ABDOMEN PELVIS W CONTRAST Addendum Date: 07/27/2024 ADDENDUM REPORT: 07/27/2024 16:06 ADDENDUM: Critical Value/emergent results were called by telephone at the time of interpretation on 07/27/2024 at 4:06 pm to provider ALMARIE CARPEN , who verbally acknowledged these results. Electronically Signed   By: Toribio Agreste M.D.    On: 07/27/2024 16:06   Result Date: 07/27/2024 CLINICAL DATA:  Generalized abdominal pain. Hysterectomy 06/01/2024. Cystoscopy 06/01/2024. EXAM: CT ABDOMEN AND PELVIS WITH CONTRAST TECHNIQUE: Multidetector CT imaging of the abdomen and pelvis was performed using the standard protocol following bolus administration of intravenous contrast. RADIATION DOSE REDUCTION: This exam was performed according to the departmental dose-optimization program which includes automated exposure control, adjustment of the mA and/or kV according to patient size and/or use of iterative reconstruction technique. CONTRAST:  75mL OMNIPAQUE  IOHEXOL  350 MG/ML SOLN COMPARISON:  07/31/2022 FINDINGS: Lower chest: Heart is normal size.  Visualized lung bases are clear. Hepatobiliary: Liver, gallbladder and biliary tree are normal. Pancreas: Normal. Spleen: Normal. Adrenals/Urinary Tract: Adrenal glands are normal. Kidneys are normal in size without hydronephrosis or nephrolithiasis. Stable bilateral renal cysts. Ureters are normal. Small amount air over the nondependent portion of the bladder which may be due to recent instrumentation versus fistula. Stomach/Bowel: Stomach and small bowel are normal. Appendix is normal. There is minimal wall thickening of the rectosigmoid colon immediately adjacent the hysterectomy surgical site as the colon is otherwise unremarkable. Mild to moderate free peritoneal air is present. No definite evidence of bowel perforation. Vascular/Lymphatic: Minimal calcified plaque over the abdominal aorta which is normal in caliber. No evidence of adenopathy. Reproductive: Evidence of recent hysterectomy. Small flecks of air over the surgical site. Small amount of fluid over the surgical site. Adnexal regions are unremarkable. Other: None. Musculoskeletal: No acute findings. IMPRESSION: 1. Evidence of recent hysterectomy. Small amount of fluid and air over the surgical site. No definite abscess. Mild to moderate free  peritoneal air without definite evidence of bowel perforation. Minimal nonspecific wall thickening of the rectosigmoid colon immediately adjacent the surgical site. Origin of free peritoneal air is inconclusive, although may be related to the hysterectomy surgical site. 2. Small amount of air over the nondependent portion of the bladder which may be due to recent instrumentation versus fistula. 3. Stable bilateral renal cysts. 4. Aortic atherosclerosis. Aortic Atherosclerosis (ICD10-I70.0). Ordering physician has been paged. Electronically Signed: By: Toribio Agreste M.D. On: 07/27/2024 15:43   Scheduled Meds:  (feeding supplement) PROSource Plus  30 mL Oral BID BM   calcium  carbonate  2  tablet Oral Q6H   docusate sodium   100 mg Oral BID   estradiol   1 Applicatorful Vaginal Daily   feeding supplement  237 mL Oral TID with meals   heparin  injection (subcutaneous)  5,000 Units Subcutaneous Q8H   ibuprofen   600 mg Oral Q6H   insulin  aspart  0-15 Units Subcutaneous TID WC   insulin  aspart  0-5 Units Subcutaneous QHS   insulin  glargine-yfgn  16 Units Subcutaneous Daily   lactobacillus  1 g Oral TID WC   metoCLOPramide   10 mg Oral TID AC   multivitamin with minerals  1 tablet Oral Daily   scopolamine   1 patch Transdermal Once   sodium phosphate   1 enema Rectal Once   Continuous Infusions:  famotidine  (PEPCID ) IV     metronidazole      ondansetron  (ZOFRAN ) IV       LOS: 1 day    Time spent: 35 mins    Darcel Dawley, MD [facility]   If 7PM-7AM, please contact night-coverage

## 2024-07-30 LAB — GLUCOSE, CAPILLARY
Glucose-Capillary: 113 mg/dL — ABNORMAL HIGH (ref 70–99)
Glucose-Capillary: 118 mg/dL — ABNORMAL HIGH (ref 70–99)
Glucose-Capillary: 126 mg/dL — ABNORMAL HIGH (ref 70–99)
Glucose-Capillary: 137 mg/dL — ABNORMAL HIGH (ref 70–99)
Glucose-Capillary: 180 mg/dL — ABNORMAL HIGH (ref 70–99)

## 2024-07-30 LAB — BASIC METABOLIC PANEL WITH GFR
Anion gap: 12 (ref 5–15)
BUN: 6 mg/dL (ref 6–20)
CO2: 20 mmol/L — ABNORMAL LOW (ref 22–32)
Calcium: 8.5 mg/dL — ABNORMAL LOW (ref 8.9–10.3)
Chloride: 106 mmol/L (ref 98–111)
Creatinine, Ser: 0.51 mg/dL (ref 0.44–1.00)
GFR, Estimated: >60 mL/min (ref >60)
Glucose, Bld: 114 mg/dL — ABNORMAL HIGH (ref 70–99)
Potassium: 3.7 mmol/L (ref 3.5–5.1)
Sodium: 138 mmol/L (ref 135–145)

## 2024-07-30 LAB — MAGNESIUM: Magnesium: 1.7 mg/dL (ref 1.7–2.4)

## 2024-07-30 LAB — PHOSPHORUS: Phosphorus: 3.1 mg/dL (ref 2.5–4.6)

## 2024-07-30 MED ORDER — FLUCONAZOLE 150 MG PO TABS
150.0000 mg | ORAL_TABLET | Freq: Once | ORAL | Status: AC
  Administered 2024-07-30: 150 mg via ORAL
  Filled 2024-07-30: qty 1

## 2024-07-30 MED ORDER — MAGNESIUM SULFATE 2 GM/50ML IV SOLN
2.0000 g | Freq: Once | INTRAVENOUS | Status: AC
  Administered 2024-07-30: 2 g via INTRAVENOUS
  Filled 2024-07-30: qty 50

## 2024-07-30 MED ORDER — POTASSIUM CHLORIDE 10 MEQ/100ML IV SOLN: 10.0000 meq | INTRAVENOUS | Status: AC

## 2024-07-30 MED ORDER — LACTATED RINGERS IV SOLN: INTRAVENOUS | Status: DC

## 2024-07-30 MED ORDER — FLUCONAZOLE 150 MG PO TABS: 150.0000 mg | ORAL_TABLET | Freq: Once | ORAL | Status: DC

## 2024-07-30 MED ORDER — INSULIN GLARGINE-YFGN 100 UNIT/ML ~~LOC~~ SOLN
10.0000 [IU] | Freq: Every day | SUBCUTANEOUS | Status: DC
  Administered 2024-07-30 – 2024-07-31 (×2): 10 [IU] via SUBCUTANEOUS
  Filled 2024-07-30 (×3): qty 0.1

## 2024-07-30 MED ORDER — SODIUM CHLORIDE 0.9 % IV SOLN: INTRAVENOUS | Status: AC | PRN

## 2024-07-30 MED ORDER — POTASSIUM CHLORIDE 10 MEQ/100ML IV SOLN
10.0000 meq | INTRAVENOUS | Status: AC
  Administered 2024-07-30 (×3): 10 meq via INTRAVENOUS
  Filled 2024-07-30 (×3): qty 100

## 2024-07-30 MED ORDER — KETOROLAC TROMETHAMINE 30 MG/ML IJ SOLN
30.0000 mg | Freq: Three times a day (TID) | INTRAMUSCULAR | Status: AC
  Administered 2024-07-30 – 2024-07-31 (×3): 30 mg via INTRAVENOUS
  Filled 2024-07-30 (×3): qty 1

## 2024-07-30 NOTE — Progress Notes (Signed)
 2 Days Post-Op   Subjective/Chief Complaint: Reports she is feeling slightly better this morning.  Denies significant nausea.  States she was given an enema and had 2 or 3 bowel movements overnight which she reports are nonbloody.  Sore from surgery but pain is controlled.   Objective: Vital signs in last 24 hours: Temp:  [97.9 F (36.6 C)-99 F (37.2 C)] 99 F (37.2 C) (08/02 0810) Pulse Rate:  [84-99] 92 (08/02 0810) Resp:  [16-20] 16 (08/02 0810) BP: (126-162)/(64-93) 128/66 (08/02 0810) SpO2:  [97 %-100 %] 97 % (08/02 0810) Last BM Date : 07/26/24  Intake/Output from previous day: 08/01 0701 - 08/02 0700 In: -  Out: 1075 [Urine:1075] Intake/Output this shift: No intake/output data recorded.  Alert, no distress Unlabored respirations Abdomen is soft, nondistended, appropriately tender  Lab Results:  Recent Labs    07/28/24 0525 07/29/24 0524  WBC 8.0 9.7  HGB 11.3* 10.9*  HCT 33.1* 31.4*  PLT 269 263   BMET Recent Labs    07/29/24 0524 07/30/24 0439  NA 138 138  K 3.4* 3.7  CL 106 106  CO2 22 20*  GLUCOSE 193* 114*  BUN 9 6  CREATININE 0.65 0.51  CALCIUM  8.3* 8.5*   PT/INR No results for input(s): LABPROT, INR in the last 72 hours. ABG No results for input(s): PHART, HCO3 in the last 72 hours.  Invalid input(s): PCO2, PO2  Studies/Results: CT ABDOMEN PELVIS W CONTRAST Result Date: 07/29/2024 EXAM: CT ABDOMEN AND PELVIS WITH CONTRAST 07/29/2024 09:20:15 PM TECHNIQUE: CT of the abdomen and pelvis was performed with the administration of intravenous contrast. Multiplanar reformatted images are provided for review. Automated exposure control, iterative reconstruction, and/or weight based adjustment of the mA/kV was utilized to reduce the radiation dose to as low as reasonably achievable. COMPARISON: CT cystogram 07/28/2024 and CT abdomen and pelvis 07/27/2024. CLINICAL HISTORY: Abdominal pain, post-op. FINDINGS: LOWER CHEST: No acute  abnormality. LIVER: Hepatic steatosis. GALLBLADDER AND BILE DUCTS: Gallbladder is unremarkable. No biliary ductal dilatation. SPLEEN: No acute abnormality. PANCREAS: No acute abnormality. ADRENAL GLANDS: No acute abnormality. KIDNEYS, URETERS AND BLADDER: No stones in the kidneys or ureters. No hydronephrosis. No perinephric or periureteral stranding. Small amount of gas in the bladder likely related to recent instrumentation. No bladder wall defect was seen on CT cystogram performed 07/28/2024. GI AND BOWEL: Enteric contrast in the small bowel. Wall thickening of the rectosigmoid colon adjacent to the hysterectomy surgical site. Normal appendix. PERITONEUM AND RETROPERITONEUM: The free intraperitoneal air has nearly resolved. Postoperative change of recent hysterectomy with stranding and loosely organized fluid about the posterior vaginal cuff/hysterectomy site is similar to 07/28/2024. Multiple stellate tracts extend from the hysterectomy site abutting the rectosigmoid colon and posterior bladder. Colocolonic or colovaginal fistulas are not excluded. If there is concern for colonic fistula, CT with rectal and IV contrast is recommended. VASCULATURE: Aorta is normal in caliber. LYMPH NODES: No lymphadenopathy. REPRODUCTIVE ORGANS: Postoperative change of recent hysterectomy. BONES AND SOFT TISSUES: No acute osseous abnormality. No focal soft tissue abnormality. IMPRESSION: 1. Postoperative and inflammatory changes about the hysterectomy site and Rectosigmoid Colon similar to prior study. Multiple stellate tracts extend from the hysterectomy site abutting the rectosigmoid colon and posterior bladder. Colocolonic or colovaginal fistulas are not excluded. If there is concern for colonic fistula, CT with rectal and IV contrast is recommended. Electronically signed by: Norman Gatlin MD 07/29/2024 09:36 PM EDT RP Workstation: [id number]   DG Abd Portable 1V Result Date: 07/29/2024 CLINICAL DATA:  Enteric  catheter  placement EXAM: PORTABLE ABDOMEN - 1 VIEW COMPARISON:  07/29/2024 FINDINGS: Semi-erect frontal view of the abdomen was obtained, excluding the right hemidiaphragm and lower pelvis by collimation. Enteric catheter passes below diaphragm tip projecting over the gastric fundus. Side port projects at the level of the gastroesophageal junction. Bowel gas pattern is unremarkable. No acute bony abnormalities. IMPRESSION: 1. Enteric catheter tip projecting over the gastric fundus. Electronically Signed   By: Ozell Daring M.D.   On: 07/29/2024 18:21   DG Abd 2 Views Result Date: 07/29/2024 CLINICAL DATA:  Nausea, vomiting. EXAM: ABDOMEN - 2 VIEW COMPARISON:  July 28, 2024. FINDINGS: No abnormal bowel dilatation. Moderate amount of stool seen throughout the colon. There is no evidence of free air. No radio-opaque calculi or other significant radiographic abnormality is seen. IMPRESSION: Moderate stool burden.  No abnormal bowel dilatation. Electronically Signed   By: Lynwood Landy Raddle M.D.   On: 07/29/2024 16:10   CT CYSTOGRAM PELVIS Result Date: 07/28/2024 CLINICAL DATA:  Right lower quadrant abdominal pain. EXAM: CT CYSTOGRAM (CT PELVIS WITH CONTRAST) TECHNIQUE: Multidetector CT imaging through the pelvis was performed after dilute contrast had been introduced into the bladder for the purposes of performing CT cystography. RADIATION DOSE REDUCTION: This exam was performed according to the departmental dose-optimization program which includes automated exposure control, adjustment of the mA and/or kV according to patient size and/or use of iterative reconstruction technique. CONTRAST:  50mL OMNIPAQUE  IOHEXOL  350 MG/ML SOLN COMPARISON:  July 27, 2024. FINDINGS: Urinary Tract: Foley catheter is noted within urinary bladder. Normal contrast filling of urinary bladder is noted without leakage or extravasation. Small amount of air is noted in nondependent portion of urinary bladder which most likely is due to  instrumentation. No filling defects are noted. Bowel:  Unremarkable visualized pelvic bowel loops. Vascular/Lymphatic: No pathologically enlarged lymph nodes. No significant vascular abnormality seen. Reproductive: Status post hysterectomy. Mild amount of stranding and fluid is noted in posterior pelvis consistent with recent hysterectomy. Other:  No hernia is noted. Musculoskeletal: No suspicious bone lesions identified. IMPRESSION: No significant abnormality seen involving the urinary bladder. No extravasation or leakage is noted. Foley catheter is noted. Expected postoperative changes are noted in the posterior pelvis consistent with recent hysterectomy. Electronically Signed   By: Lynwood Landy Raddle M.D.   On: 07/28/2024 12:08   DG Abd 2 Views Result Date: 07/28/2024 CLINICAL DATA:  Nausea and vomiting. Recent history of hysterectomy on 06/01/2024. EXAM: ABDOMEN - 2 VIEW COMPARISON:  CT abdomen/pelvis dated 07/27/2024. FINDINGS: Nonobstructive bowel-gas pattern. Intraperitoneal free air is better visualized on the prior CT abdomen/pelvis dated 07/27/2024. Moderate volume of stool in the right colon. No acute osseous abnormality. IMPRESSION: 1. Intraperitoneal free air is better visualized on the prior CT abdomen/pelvis dated 07/27/2024. 2. Nonobstructive bowel gas pattern. Electronically Signed   By: Harrietta Sherry M.D.   On: 07/28/2024 11:28    Anti-infectives: Anti-infectives (From admission, onward)    Start     Dose/Rate Route Frequency Ordered Stop   07/29/24 2300  piperacillin -tazobactam (ZOSYN ) IVPB 3.375 g        3.375 g 12.5 mL/hr over 240 Minutes Intravenous Every 8 hours 07/29/24 1403     07/29/24 2200  metroNIDAZOLE  (FLAGYL ) tablet 500 mg  Status:  Discontinued        500 mg Oral Every 12 hours 07/29/24 0955 07/29/24 1345   07/29/24 2000  metroNIDAZOLE  (FLAGYL ) IVPB 500 mg        500 mg 100 mL/hr  over 60 Minutes Intravenous Every 12 hours 07/29/24 1432     07/29/24 1445   metroNIDAZOLE  (FLAGYL ) IVPB 500 mg  Status:  Discontinued        500 mg 100 mL/hr over 60 Minutes Intravenous Every 12 hours 07/29/24 1345 07/29/24 1431   07/29/24 1045  levofloxacin  (LEVAQUIN ) tablet 750 mg  Status:  Discontinued        750 mg Oral Daily 07/29/24 0953 07/29/24 1345   07/29/24 1000  sulfamethoxazole -trimethoprim  (BACTRIM  DS) 800-160 MG per tablet 1 tablet  Status:  Discontinued        1 tablet Oral Every 12 hours 07/29/24 0744 07/29/24 0953   07/29/24 0915  metroNIDAZOLE  (FLAGYL ) tablet 500 mg  Status:  Discontinued        500 mg Oral Every 8 hours 07/29/24 0905 07/29/24 0955   07/29/24 0000  sulfamethoxazole -trimethoprim  (BACTRIM  DS) 800-160 MG tablet  Status:  Discontinued        1 tablet Oral Every 12 hours 07/29/24 0933 07/29/24    07/29/24 0000  metroNIDAZOLE  (FLAGYL ) 500 MG tablet        500 mg Oral Every 8 hours 07/29/24 0933 08/12/24 2359   07/29/24 0000  levofloxacin  (LEVAQUIN ) 750 MG tablet        750 mg Oral Daily 07/29/24 1256     07/28/24 1500  cefazolin  1000 mg in normal saline 1000 mL irrigation         Irrigation  Once 07/28/24 1400 07/28/24 1558   07/27/24 1215  piperacillin -tazobactam (ZOSYN ) IVPB 3.375 g  Status:  Discontinued        3.375 g 100 mL/hr over 30 Minutes Intravenous Every 6 hours 07/27/24 1119 07/27/24 1126   07/27/24 1200  piperacillin -tazobactam (ZOSYN ) IVPB 3.375 g  Status:  Discontinued        3.375 g 12.5 mL/hr over 240 Minutes Intravenous Every 8 hours 07/27/24 1128 07/29/24 0743       Assessment/Plan:  43 year old woman with history of robotic hysterectomy/BSO on 6//25 who is now postop day 2 status post repair of delayed vaginal cuff dehiscence.  Surgery consulted for intractable nausea, patient did not tolerate NG tube, reports symptomatic improvement this morning.  CT reviewed, expected inflammatory changes in the pelvis but no obstruction or findings to suggest ileus.  Appropriate postoperative abdominal exam.  Would still  consider her high risk of developing postoperative abscess in the next week or 2 given inflammatory findings at time of surgery.  Antibiotics/management per GYN team.  K/mag replacement ordered  Clear liquid diet today, advance as tolerated, mobilize as tolerated.  No indication for surgical intervention at this time.    LOS: 1 day    Mitzie DELENA Freund 07/30/2024

## 2024-07-30 NOTE — Progress Notes (Signed)
 GYN Progress NOTE  S: Patient reports 5/10 abdominal pain. No emesis overnight. Last felt nauseous last night. Had a BM after enema.  NG tube placed by Gen Surg, however it was not tolerated by patient Has a HA and is feeling dizzy after taking shower this morning.  O: Temp:  [98 F (36.7 C)-99 F (37.2 C)] 99 F (37.2 C) (08/02 0810) Pulse Rate:  [84-99] 92 (08/02 0810) Resp:  [16-19] 16 (08/02 0810) BP: (126-162)/(64-93) 128/66 (08/02 0810) SpO2:  [97 %-100 %] 97 % (08/02 0810)   Today's Vitals   07/30/24 0119 07/30/24 0406 07/30/24 0639 07/30/24 0810  BP:  128/66  128/66  Pulse:  94  92  Resp:  18  16  Temp:  98.8 F (37.1 C)  99 F (37.2 C)  TempSrc:  Oral  Oral  SpO2:  98%  97%  Weight:      Height:      PainSc: 5   6     Body mass index is 27.46 kg/m.   AAOX3, NAD Cards: Normal R&R Lungs: CTAB, No resp distress Abdomen: soft, approp tender, incision c/d/i, +BS Ext: no pedal edema     Latest Ref Rng & Units 07/29/2024    5:24 AM 07/28/2024    5:25 AM 07/27/2024   12:26 PM  CBC  WBC 4.0 - 10.5 K/uL 9.7  8.0  12.8   Hemoglobin 12.0 - 15.0 g/dL 89.0  88.6  87.6   Hematocrit 36.0 - 46.0 % 31.4  33.1  35.9   Platelets 150 - 400 K/uL 263  269  285        Latest Ref Rng & Units 07/30/2024    4:39 AM 07/29/2024    5:24 AM 07/28/2024    5:25 AM  BMP  Glucose 70 - 99 mg/dL 885  806  770   BUN 6 - 20 mg/dL 6  9  9    Creatinine 0.44 - 1.00 mg/dL 9.48  9.34  9.45   Sodium 135 - 145 mmol/L 138  138  137   Potassium 3.5 - 5.1 mmol/L 3.7  3.4  3.6   Chloride 98 - 111 mmol/L 106  106  105   CO2 22 - 32 mmol/L 20  22  24    Calcium  8.9 - 10.3 mg/dL 8.5  8.3  8.6    91/98/7974 CT ab/pelvis: IMPRESSION: 1. Postoperative and inflammatory changes about the hysterectomy site and Rectosigmoid Colon similar to prior study. Multiple stellate tracts extend from the hysterectomy site abutting the rectosigmoid colon and posterior bladder. Colocolonic or colovaginal fistulas  are not excluded. If there is concern for colonic fistula, CT with rectal and IV contrast is recommended.   A/P 43 y.o. female admitted with abdominal pain and nausea 8wk s/p RLH  6/4, now POD #2 s/p repair of vaginal cuff, operative laparoscopy, cystoscopy, pelvic infection, UTI (dx 07/27/24), BV, yeast  VSS. No UOP documented overnight. Strict I/O ordered. Nausea improved, did not tolerate NG tube. Diet advanced per Gen Surg to clear liquids. Encouraged ambulation today.  ID: Concern for pelvic infection with cuff dehiscence, Ucx (7/30) + E. Coli, wet prep + BV and yeast. On zosyn  and flagyl . PO diflucan  x1 ordered. Continue IVF until proven diet and can manage oral pills. Mag and K repleted by Gen Surg. T2DM: Continue lantus  and SSI. Patient counseled on importance of good fsbs control.  Analgesia: scheduled toradol  ordered. Tylenol  PRN, oxycodone  PRN Continue heparin  for DVT prophylaxis  Mackenzey Crownover V  Dallie, MD

## 2024-07-30 NOTE — Care Plan (Addendum)
 Her blood glucose has been controlled. She is status post robotic hysterectomy/BSO on 6/25 now status  post repair of delayed vaginal cuff dehiscence. Her long-acting insulin  has been increased to 16 units daily.  Patient started on clear liquid diet,  advance as tolerated.  We signed off yesterday.  Please reconsult if needed.

## 2024-07-31 LAB — CBC
HCT: 28.7 % — ABNORMAL LOW (ref 36.0–46.0)
Hemoglobin: 9.9 g/dL — ABNORMAL LOW (ref 12.0–15.0)
MCH: 29.5 pg (ref 26.0–34.0)
MCHC: 34.5 g/dL (ref 30.0–36.0)
MCV: 85.4 fL (ref 80.0–100.0)
Platelets: 293 K/uL (ref 150–400)
RBC: 3.36 MIL/uL — ABNORMAL LOW (ref 3.87–5.11)
RDW: 12.2 % (ref 11.5–15.5)
WBC: 5.8 K/uL (ref 4.0–10.5)
nRBC: 0 % (ref 0.0–0.2)

## 2024-07-31 LAB — COMPREHENSIVE METABOLIC PANEL WITH GFR
ALT: 19 U/L (ref 0–44)
AST: 11 U/L — ABNORMAL LOW (ref 15–41)
Albumin: 2.5 g/dL — ABNORMAL LOW (ref 3.5–5.0)
Alkaline Phosphatase: 25 U/L — ABNORMAL LOW (ref 38–126)
Anion gap: 10 (ref 5–15)
BUN: 9 mg/dL (ref 6–20)
CO2: 24 mmol/L (ref 22–32)
Calcium: 8.4 mg/dL — ABNORMAL LOW (ref 8.9–10.3)
Chloride: 103 mmol/L (ref 98–111)
Creatinine, Ser: 0.61 mg/dL (ref 0.44–1.00)
GFR, Estimated: >60 mL/min (ref >60)
Glucose, Bld: 122 mg/dL — ABNORMAL HIGH (ref 70–99)
Potassium: 3.3 mmol/L — ABNORMAL LOW (ref 3.5–5.1)
Sodium: 137 mmol/L (ref 135–145)
Total Bilirubin: 0.6 mg/dL (ref 0.0–1.2)
Total Protein: 5.7 g/dL — ABNORMAL LOW (ref 6.5–8.1)

## 2024-07-31 LAB — GLUCOSE, CAPILLARY
Glucose-Capillary: 124 mg/dL — ABNORMAL HIGH (ref 70–99)
Glucose-Capillary: 222 mg/dL — ABNORMAL HIGH (ref 70–99)
Glucose-Capillary: 167 mg/dL — ABNORMAL HIGH (ref 70–99)
Glucose-Capillary: 98 mg/dL (ref 70–99)
Glucose-Capillary: 225 mg/dL — ABNORMAL HIGH (ref 70–99)

## 2024-07-31 MED ORDER — POTASSIUM CHLORIDE 10 MEQ/100ML IV SOLN
10.0000 meq | INTRAVENOUS | Status: DC
  Administered 2024-07-31: 10 meq via INTRAVENOUS
  Filled 2024-07-31: qty 100

## 2024-07-31 MED ORDER — KETOROLAC TROMETHAMINE 30 MG/ML IJ SOLN
30.0000 mg | Freq: Four times a day (QID) | INTRAMUSCULAR | Status: AC
  Administered 2024-07-31 – 2024-08-01 (×4): 30 mg via INTRAVENOUS
  Filled 2024-07-31 (×4): qty 1

## 2024-07-31 MED ORDER — POTASSIUM CHLORIDE 10 MEQ/100ML IV SOLN
10.0000 meq | INTRAVENOUS | Status: AC
  Administered 2024-07-31 (×5): 10 meq via INTRAVENOUS
  Filled 2024-07-31 (×5): qty 100

## 2024-07-31 NOTE — Progress Notes (Signed)
 GYN Progress NOTE  S: Patient reports abdominal soreness, HA, and nausea today. No emesis overnight. Felt better yesterday, advance to soft diet overnight and nausea returned. +flatus.  O: Temp:  [97.5 F (36.4 C)-98.3 F (36.8 C)] 97.5 F (36.4 C) (08/03 0903) Pulse Rate:  [69-92] 69 (08/03 0903) Resp:  [16-20] 20 (08/03 0903) BP: (116-149)/(59-76) 149/76 (08/03 0903) SpO2:  [99 %-100 %] 100 % (08/03 0903)   Today's Vitals   07/31/24 0540 07/31/24 0802 07/31/24 0848 07/31/24 0903  BP:    (!) 149/76  Pulse:    69  Resp:    20  Temp:    (!) 97.5 F (36.4 C)  TempSrc:    Axillary  SpO2:    100%  Weight:      Height:      PainSc: 0-No pain 5  5     Body mass index is 27.46 kg/m.   AAOX3, NAD Cards: Normal R&R Lungs: CTAB, No resp distress Abdomen: soft, approp tender, incision c/d/i, +BS Ext: no pedal edema     Latest Ref Rng & Units 07/31/2024    4:50 AM 07/29/2024    5:24 AM 07/28/2024    5:25 AM  CBC  WBC 4.0 - 10.5 K/uL 5.8  9.7  8.0   Hemoglobin 12.0 - 15.0 g/dL 9.9  89.0  88.6   Hematocrit 36.0 - 46.0 % 28.7  31.4  33.1   Platelets 150 - 400 K/uL 293  263  269        Latest Ref Rng & Units 07/31/2024    4:50 AM 07/30/2024    4:39 AM 07/29/2024    5:24 AM  BMP  Glucose 70 - 99 mg/dL 877  885  806   BUN 6 - 20 mg/dL 9  6  9    Creatinine 0.44 - 1.00 mg/dL 9.38  9.48  9.34   Sodium 135 - 145 mmol/L 137  138  138   Potassium 3.5 - 5.1 mmol/L 3.3  3.7  3.4   Chloride 98 - 111 mmol/L 103  106  106   CO2 22 - 32 mmol/L 24  20  22    Calcium  8.9 - 10.3 mg/dL 8.4  8.5  8.3    91/98/7974 CT ab/pelvis: IMPRESSION: 1. Postoperative and inflammatory changes about the hysterectomy site and Rectosigmoid Colon similar to prior study. Multiple stellate tracts extend from the hysterectomy site abutting the rectosigmoid colon and posterior bladder. Colocolonic or colovaginal fistulas are not excluded. If there is concern for colonic fistula, CT with rectal and IV contrast  is recommended.   A/P 43 y.o. female admitted with abdominal pain and nausea 8wk s/p RLH  6/4, now POD #3 s/p repair of vaginal cuff, operative laparoscopy, cystoscopy, pelvic infection, UTI (dx 07/27/24), BV, yeast  VSS. UOP adequate. Strict I/O ordered. 8/1- did not tolerate NG tube. Diet advanced yesterday with return of nausea. Return to clear liquids. Encouraged ambulation today.  ID: Concern for pelvic infection with cuff dehiscence, Ucx (7/30) + E. Coli, wet prep + BV and yeast. On zosyn  and flagyl , continue. PO diflucan  x1 ordered. Continue IVF until proven diet and can manage oral pills. K repleted by Gen Surg. T2DM: Continue lantus  and SSI. Patient counseled on importance of good fsbs control.  Analgesia: scheduled toradol  ordered. Tylenol  PRN, oxycodone  PRN Continue heparin  for DVT prophylaxis  Vera LULLA Pa, MD

## 2024-07-31 NOTE — Progress Notes (Signed)
 3 Days Post-Op   Subjective/Chief Complaint: Tolerating p.o. yesterday.  No further bowel movement since Friday night.  Headache and some nausea this morning   Objective: Vital signs in last 24 hours: Temp:  [98 F (36.7 C)-98.3 F (36.8 C)] 98.2 F (36.8 C) (08/02 2330) Pulse Rate:  [75-92] 75 (08/02 2330) Resp:  [16-18] 18 (08/02 2008) BP: (116-138)/(59-70) 116/59 (08/02 2330) SpO2:  [99 %] 99 % (08/02 1500) Last BM Date : 07/26/24  Intake/Output from previous day: 08/02 0701 - 08/03 0700 In: 959.7 [P.O.:530; I.V.:0.2; IV Piggyback:429.5] Out: 525 [Urine:525] Intake/Output this shift: No intake/output data recorded.  Alert, no distress Unlabored respirations Abdomen is soft, nondistended, appropriately tender.  Incisions are clean, dry and intact.  Lab Results:  Recent Labs    07/29/24 0524 07/31/24 0450  WBC 9.7 5.8  HGB 10.9* 9.9*  HCT 31.4* 28.7*  PLT 263 293   BMET Recent Labs    07/30/24 0439 07/31/24 0450  NA 138 137  K 3.7 3.3*  CL 106 103  CO2 20* 24  GLUCOSE 114* 122*  BUN 6 9  CREATININE 0.51 0.61  CALCIUM  8.5* 8.4*   PT/INR No results for input(s): LABPROT, INR in the last 72 hours. ABG No results for input(s): PHART, HCO3 in the last 72 hours.  Invalid input(s): PCO2, PO2  Studies/Results: CT ABDOMEN PELVIS W CONTRAST Result Date: 07/29/2024 EXAM: CT ABDOMEN AND PELVIS WITH CONTRAST 07/29/2024 09:20:15 PM TECHNIQUE: CT of the abdomen and pelvis was performed with the administration of intravenous contrast. Multiplanar reformatted images are provided for review. Automated exposure control, iterative reconstruction, and/or weight based adjustment of the mA/kV was utilized to reduce the radiation dose to as low as reasonably achievable. COMPARISON: CT cystogram 07/28/2024 and CT abdomen and pelvis 07/27/2024. CLINICAL HISTORY: Abdominal pain, post-op. FINDINGS: LOWER CHEST: No acute abnormality. LIVER: Hepatic steatosis. GALLBLADDER  AND BILE DUCTS: Gallbladder is unremarkable. No biliary ductal dilatation. SPLEEN: No acute abnormality. PANCREAS: No acute abnormality. ADRENAL GLANDS: No acute abnormality. KIDNEYS, URETERS AND BLADDER: No stones in the kidneys or ureters. No hydronephrosis. No perinephric or periureteral stranding. Small amount of gas in the bladder likely related to recent instrumentation. No bladder wall defect was seen on CT cystogram performed 07/28/2024. GI AND BOWEL: Enteric contrast in the small bowel. Wall thickening of the rectosigmoid colon adjacent to the hysterectomy surgical site. Normal appendix. PERITONEUM AND RETROPERITONEUM: The free intraperitoneal air has nearly resolved. Postoperative change of recent hysterectomy with stranding and loosely organized fluid about the posterior vaginal cuff/hysterectomy site is similar to 07/28/2024. Multiple stellate tracts extend from the hysterectomy site abutting the rectosigmoid colon and posterior bladder. Colocolonic or colovaginal fistulas are not excluded. If there is concern for colonic fistula, CT with rectal and IV contrast is recommended. VASCULATURE: Aorta is normal in caliber. LYMPH NODES: No lymphadenopathy. REPRODUCTIVE ORGANS: Postoperative change of recent hysterectomy. BONES AND SOFT TISSUES: No acute osseous abnormality. No focal soft tissue abnormality. IMPRESSION: 1. Postoperative and inflammatory changes about the hysterectomy site and Rectosigmoid Colon similar to prior study. Multiple stellate tracts extend from the hysterectomy site abutting the rectosigmoid colon and posterior bladder. Colocolonic or colovaginal fistulas are not excluded. If there is concern for colonic fistula, CT with rectal and IV contrast is recommended. Electronically signed by: Norman Gatlin MD 07/29/2024 09:36 PM EDT RP Workstation: [id number]   DG Abd Portable 1V Result Date: 07/29/2024 CLINICAL DATA:  Enteric catheter placement EXAM: PORTABLE ABDOMEN - 1 VIEW COMPARISON:   07/29/2024 FINDINGS:  Semi-erect frontal view of the abdomen was obtained, excluding the right hemidiaphragm and lower pelvis by collimation. Enteric catheter passes below diaphragm tip projecting over the gastric fundus. Side port projects at the level of the gastroesophageal junction. Bowel gas pattern is unremarkable. No acute bony abnormalities. IMPRESSION: 1. Enteric catheter tip projecting over the gastric fundus. Electronically Signed   By: Ozell Daring M.D.   On: 07/29/2024 18:21   DG Abd 2 Views Result Date: 07/29/2024 CLINICAL DATA:  Nausea, vomiting. EXAM: ABDOMEN - 2 VIEW COMPARISON:  July 28, 2024. FINDINGS: No abnormal bowel dilatation. Moderate amount of stool seen throughout the colon. There is no evidence of free air. No radio-opaque calculi or other significant radiographic abnormality is seen. IMPRESSION: Moderate stool burden.  No abnormal bowel dilatation. Electronically Signed   By: Lynwood Landy Raddle M.D.   On: 07/29/2024 16:10    Anti-infectives: Anti-infectives (From admission, onward)    Start     Dose/Rate Route Frequency Ordered Stop   08/02/24 1500  fluconazole  (DIFLUCAN ) tablet 150 mg        150 mg Oral  Once 07/30/24 1721     07/30/24 1500  fluconazole  (DIFLUCAN ) tablet 150 mg        150 mg Oral  Once 07/30/24 1210 07/30/24 1541   07/29/24 2300  piperacillin -tazobactam (ZOSYN ) IVPB 3.375 g        3.375 g 12.5 mL/hr over 240 Minutes Intravenous Every 8 hours 07/29/24 1403     07/29/24 2200  metroNIDAZOLE  (FLAGYL ) tablet 500 mg  Status:  Discontinued        500 mg Oral Every 12 hours 07/29/24 0955 07/29/24 1345   07/29/24 2000  metroNIDAZOLE  (FLAGYL ) IVPB 500 mg        500 mg 100 mL/hr over 60 Minutes Intravenous Every 12 hours 07/29/24 1432     07/29/24 1445  metroNIDAZOLE  (FLAGYL ) IVPB 500 mg  Status:  Discontinued        500 mg 100 mL/hr over 60 Minutes Intravenous Every 12 hours 07/29/24 1345 07/29/24 1431   07/29/24 1045  levofloxacin  (LEVAQUIN ) tablet 750 mg   Status:  Discontinued        750 mg Oral Daily 07/29/24 0953 07/29/24 1345   07/29/24 1000  sulfamethoxazole -trimethoprim  (BACTRIM  DS) 800-160 MG per tablet 1 tablet  Status:  Discontinued        1 tablet Oral Every 12 hours 07/29/24 0744 07/29/24 0953   07/29/24 0915  metroNIDAZOLE  (FLAGYL ) tablet 500 mg  Status:  Discontinued        500 mg Oral Every 8 hours 07/29/24 0905 07/29/24 0955   07/29/24 0000  sulfamethoxazole -trimethoprim  (BACTRIM  DS) 800-160 MG tablet  Status:  Discontinued        1 tablet Oral Every 12 hours 07/29/24 0933 07/29/24    07/29/24 0000  metroNIDAZOLE  (FLAGYL ) 500 MG tablet        500 mg Oral Every 8 hours 07/29/24 0933 08/12/24 2359   07/29/24 0000  levofloxacin  (LEVAQUIN ) 750 MG tablet        750 mg Oral Daily 07/29/24 1256     07/28/24 1500  cefazolin  1000 mg in normal saline 1000 mL irrigation         Irrigation  Once 07/28/24 1400 07/28/24 1558   07/27/24 1215  piperacillin -tazobactam (ZOSYN ) IVPB 3.375 g  Status:  Discontinued        3.375 g 100 mL/hr over 30 Minutes Intravenous Every 6 hours 07/27/24 1119 07/27/24 1126  07/27/24 1200  piperacillin -tazobactam (ZOSYN ) IVPB 3.375 g  Status:  Discontinued        3.375 g 12.5 mL/hr over 240 Minutes Intravenous Every 8 hours 07/27/24 1128 07/29/24 0743       Assessment/Plan:  43 year old woman with history of robotic hysterectomy/BSO on 06/01/24 who is now postop day 3 status post repair of delayed vaginal cuff dehiscence.  Surgery consulted for intractable nausea, now resolved.  CT 8/1 reviewed, expected inflammatory changes in the pelvis but no obstruction or findings to suggest ileus.  Appropriate postoperative abdominal exam.  Would still consider her high risk of developing postoperative abscess in the next week or 2 given inflammatory findings at time of surgery.  Antibiotics/management per GYN team.  K+ replacement ordered  Diet advanced to carb modified.  Mobilize as tolerated.  General surgery team  will sign off.    LOS: 3 days    Linda Carlson 07/31/2024

## 2024-08-01 LAB — CBC
HCT: 31.1 % — ABNORMAL LOW (ref 36.0–46.0)
Hemoglobin: 10.8 g/dL — ABNORMAL LOW (ref 12.0–15.0)
MCH: 29.7 pg (ref 26.0–34.0)
MCHC: 34.7 g/dL (ref 30.0–36.0)
MCV: 85.4 fL (ref 80.0–100.0)
Platelets: 362 K/uL (ref 150–400)
RBC: 3.64 MIL/uL — ABNORMAL LOW (ref 3.87–5.11)
RDW: 12 % (ref 11.5–15.5)
WBC: 5.9 K/uL (ref 4.0–10.5)
nRBC: 0 % (ref 0.0–0.2)

## 2024-08-01 LAB — GLUCOSE, CAPILLARY
Glucose-Capillary: 171 mg/dL — ABNORMAL HIGH (ref 70–99)
Glucose-Capillary: 200 mg/dL — ABNORMAL HIGH (ref 70–99)

## 2024-08-01 MED ORDER — METRONIDAZOLE 500 MG PO TABS: 500.0000 mg | ORAL_TABLET | Freq: Two times a day (BID) | ORAL | 0 refills | Status: AC

## 2024-08-01 NOTE — Progress Notes (Signed)
 OBGYN Note  Patient is feeling much better.  Tolerated dinner yesterday and breakfast this AM. No dry heaves or vomiting. Voiding and ambulating. No VB, no fevers  Blood pressure (!) 155/90, pulse 86, temperature 98 F (36.7 C), temperature source Oral, resp. rate 16, height 5' 4 (1.626 m), weight 72.6 kg, last menstrual period 05/11/2024, SpO2 100%.  Poct 225, 98, 167, 222 CBC    Component Value Date/Time   WBC 5.9 08/01/2024 0425   RBC 3.64 (L) 08/01/2024 0425   HGB 10.8 (L) 08/01/2024 0425   HCT 31.1 (L) 08/01/2024 0425   PLT 362 08/01/2024 0425   MCV 85.4 08/01/2024 0425   MCH 29.7 08/01/2024 0425   MCHC 34.7 08/01/2024 0425   RDW 12.0 08/01/2024 0425   LYMPHSABS 2.0 01/01/2023 1644   MONOABS 0.5 01/01/2023 1644   EOSABS 0.2 01/01/2023 1644   BASOSABS 0.1 01/01/2023 1644    POD#4 repair of vaginal cuff with operative laparoscopy and vaginal approach  Discharge to home today Discussed taking levo/flagyl  start tomorrow Record fsbs and bring to clinic weekly Vaginal estrogen daily Dr. Glennon

## 2024-08-01 NOTE — Telephone Encounter (Signed)
   [employer] denied the request for Estradiol  please advise on how you would like to move forward.  I have made pt aware of this information.    Denied today by [facility] Medicaid 2017 Denied. Per the health plan's preferred drug list, at least 2 preferred drugs must be tried before requesting this drug or tell us  why the member cannot try any preferred alternatives. Please send us  supporting chart notes and lab results. Here is list of preferred alternatives: Estring  vaginal ring, Premarin vaginal cream, Vagifem  vaginal tablet. Note: Some preferred drug(s) may have quantity limits. Refer to the health plan's preferred drug list for additional details.

## 2024-08-01 NOTE — Inpatient Diabetes Management (Signed)
 Inpatient Diabetes Program Recommendations  AACE/ADA: New Consensus Statement on Inpatient Glycemic Control (2025)  Target Ranges:  Prepandial:   less than 140 mg/dL      Peak postprandial:   less than 180 mg/dL (1-2 hours)      Critically ill patients:  140 - 180 mg/dL   Lab Results  Component Value Date   GLUCAP 171 (H) 08/01/2024   HGBA1C 11.5 (H) 07/27/2024    Review of Glycemic Control  Latest Reference Range & Units 07/31/24 17:31 07/31/24 20:53 08/01/24 09:33  Glucose-Capillary 70 - 99 mg/dL 98 774 (H) 828 (H)   Diabetes history: DM 2 Outpatient Diabetes medications: Lantus  10 units every day, Metformin  500 mg BID Current orders for Inpatient glycemic control: Semglee  10 units every day, Novolog  0-15 units TID & HS  Inpatient Diabetes Program Recommendations:    Consider increasing Semglee  16 units every day.   Thanks,  Randall Bullocks, RN, BC-ADM Inpatient Diabetes Coordinator Pager [phone]  (8a-5p)

## 2024-08-01 NOTE — Telephone Encounter (Signed)
 Submitted the PA with covermymeds using the Dxs codes of N95.2 and Z90.710.   Awaiting a response from the insurance.

## 2024-08-02 ENCOUNTER — Encounter (HOSPITAL_COMMUNITY): Payer: Self-pay | Admitting: Emergency Medicine

## 2024-08-02 ENCOUNTER — Other Ambulatory Visit: Payer: Self-pay

## 2024-08-02 ENCOUNTER — Emergency Department (HOSPITAL_COMMUNITY)

## 2024-08-02 ENCOUNTER — Emergency Department (HOSPITAL_COMMUNITY)
Admission: EM | Admit: 2024-08-02 | Discharge: 2024-08-02 | Disposition: A | Discharge status: Home / Self Care | Attending: Emergency Medicine | Admitting: Emergency Medicine

## 2024-08-02 DIAGNOSIS — R109 Unspecified abdominal pain: Secondary | ICD-10-CM

## 2024-08-02 DIAGNOSIS — R1084 Generalized abdominal pain: Secondary | ICD-10-CM | POA: Insufficient documentation

## 2024-08-02 DIAGNOSIS — Z794 Long term (current) use of insulin: Secondary | ICD-10-CM | POA: Insufficient documentation

## 2024-08-02 DIAGNOSIS — E1165 Type 2 diabetes mellitus with hyperglycemia: Secondary | ICD-10-CM | POA: Diagnosis not present

## 2024-08-02 DIAGNOSIS — Z7984 Long term (current) use of oral hypoglycemic drugs: Secondary | ICD-10-CM | POA: Diagnosis not present

## 2024-08-02 DIAGNOSIS — R0789 Other chest pain: Secondary | ICD-10-CM | POA: Insufficient documentation

## 2024-08-02 DIAGNOSIS — R410 Disorientation, unspecified: Secondary | ICD-10-CM | POA: Insufficient documentation

## 2024-08-02 DIAGNOSIS — R000 Tachycardia, unspecified: Secondary | ICD-10-CM | POA: Insufficient documentation

## 2024-08-02 DIAGNOSIS — R0602 Shortness of breath: Secondary | ICD-10-CM

## 2024-08-02 LAB — CBC WITH DIFFERENTIAL/PLATELET
Abs Granulocyte: 3 K/uL (ref 1.5–6.5)
Abs Immature Granulocytes: 0.06 K/uL (ref 0.00–0.07)
Basophils Absolute: 0.1 K/uL (ref 0.0–0.1)
Basophils Relative: 1 %
Eosinophils Absolute: 0.2 K/uL (ref 0.0–0.5)
Eosinophils Relative: 4 %
HCT: 40.1 % (ref 36.0–46.0)
Hemoglobin: 13.6 g/dL (ref 12.0–15.0)
Immature Granulocytes: 1 %
Lymphocytes Relative: 21 %
Lymphs Abs: 1 K/uL (ref 0.7–4.0)
MCH: 29.6 pg (ref 26.0–34.0)
MCHC: 33.9 g/dL (ref 30.0–36.0)
MCV: 87.4 fL (ref 80.0–100.0)
Monocytes Absolute: 0.4 K/uL (ref 0.1–1.0)
Monocytes Relative: 8 %
Neutro Abs: 3 K/uL (ref 1.7–7.7)
Neutrophils Relative %: 65 %
Platelets: 416 K/uL — ABNORMAL HIGH (ref 150–400)
RBC: 4.59 MIL/uL (ref 3.87–5.11)
RDW: 12.1 % (ref 11.5–15.5)
WBC: 4.8 K/uL (ref 4.0–10.5)
nRBC: 0 % (ref 0.0–0.2)

## 2024-08-02 LAB — I-STAT VENOUS BLOOD GAS, ED
Acid-Base Excess: 3 mmol/L — ABNORMAL HIGH (ref 0.0–2.0)
Bicarbonate: 24.7 mmol/L (ref 20.0–28.0)
Calcium, Ion: 1.07 mmol/L — ABNORMAL LOW (ref 1.15–1.40)
HCT: 38 % (ref 36.0–46.0)
Hemoglobin: 12.9 g/dL (ref 12.0–15.0)
O2 Saturation: 100 %
Potassium: 4 mmol/L (ref 3.5–5.1)
Sodium: 138 mmol/L (ref 135–145)
TCO2: 26 mmol/L (ref 22–32)
pCO2, Ven: 28.5 mmHg — ABNORMAL LOW (ref 44–60)
pH, Ven: 7.546 — ABNORMAL HIGH (ref 7.25–7.43)
pO2, Ven: 165 mmHg — ABNORMAL HIGH (ref 32–45)

## 2024-08-02 LAB — COMPREHENSIVE METABOLIC PANEL WITH GFR
ALT: 32 U/L (ref 0–44)
AST: 37 U/L (ref 15–41)
Albumin: 3.4 g/dL — ABNORMAL LOW (ref 3.5–5.0)
Alkaline Phosphatase: 34 U/L — ABNORMAL LOW (ref 38–126)
Anion gap: 13 (ref 5–15)
BUN: 10 mg/dL (ref 6–20)
CO2: 21 mmol/L — ABNORMAL LOW (ref 22–32)
Calcium: 9.5 mg/dL (ref 8.9–10.3)
Chloride: 101 mmol/L (ref 98–111)
Creatinine, Ser: 0.55 mg/dL (ref 0.44–1.00)
GFR, Estimated: >60 mL/min (ref >60)
Glucose, Bld: 256 mg/dL — ABNORMAL HIGH (ref 70–99)
Potassium: 3.8 mmol/L (ref 3.5–5.1)
Sodium: 135 mmol/L (ref 135–145)
Total Bilirubin: 0.3 mg/dL (ref 0.0–1.2)
Total Protein: 7.4 g/dL (ref 6.5–8.1)

## 2024-08-02 LAB — LIPASE, BLOOD: Lipase: 29 U/L (ref 11–51)

## 2024-08-02 LAB — TROPONIN I (HIGH SENSITIVITY)
Troponin I (High Sensitivity): 7 ng/L (ref <18)
Troponin I (High Sensitivity): 5 ng/L (ref <18)

## 2024-08-02 MED ORDER — ONDANSETRON HCL 4 MG/2ML IJ SOLN
4.0000 mg | Freq: Once | INTRAMUSCULAR | Status: AC
  Administered 2024-08-02: 4 mg via INTRAVENOUS
  Filled 2024-08-02: qty 2

## 2024-08-02 MED ORDER — IOHEXOL 350 MG/ML SOLN
75.0000 mL | Freq: Once | INTRAVENOUS | Status: AC | PRN
  Administered 2024-08-02: 75 mL via INTRAVENOUS

## 2024-08-02 MED ORDER — METOCLOPRAMIDE HCL 5 MG/ML IJ SOLN
10.0000 mg | Freq: Once | INTRAMUSCULAR | Status: AC
  Administered 2024-08-02: 10 mg via INTRAVENOUS
  Filled 2024-08-02: qty 2

## 2024-08-02 MED ORDER — HYDROMORPHONE HCL 1 MG/ML IJ SOLN
1.0000 mg | Freq: Once | INTRAMUSCULAR | Status: AC
  Administered 2024-08-02: 1 mg via INTRAVENOUS
  Filled 2024-08-02: qty 1

## 2024-08-02 MED ORDER — LACTATED RINGERS IV BOLUS
1000.0000 mL | Freq: Once | INTRAVENOUS | Status: AC
  Administered 2024-08-02: 1000 mL via INTRAVENOUS

## 2024-08-02 NOTE — Discharge Instructions (Signed)
 The tests today in the ED were reassuring.  No signs of blood clot.  The abdominal surgery seems to be healing properly. Follow up with your surgeon as planned

## 2024-08-02 NOTE — ED Notes (Signed)
MD Plunkett at bedside. 

## 2024-08-02 NOTE — Anesthesia Postprocedure Evaluation (Signed)
 Anesthesia Post Note  Patient: [name]  Procedure(s) Performed: OPERATIVE LAPAROSCOPY (Abdomen) CYSTOSCOPY (Bladder) REPAIR, VAGINAL CUFF (Vagina )     Patient location during evaluation: PACU Anesthesia Type: General Level of consciousness: awake and alert Pain management: pain level controlled Vital Signs Assessment: post-procedure vital signs reviewed and stable Respiratory status: spontaneous breathing, nonlabored ventilation, respiratory function stable and patient connected to nasal cannula oxygen Cardiovascular status: blood pressure returned to baseline and stable Postop Assessment: no apparent nausea or vomiting Anesthetic complications: no   No notable events documented.  Last Vitals:  Vitals:   08/01/24 1223 08/01/24 1240  BP: (!) 156/87 (!) 155/80  Pulse: 81 79  Resp: 16   Temp: 37.4 C   SpO2: 100%     Last Pain:  Vitals:   08/01/24 1223  TempSrc: Oral  PainSc:                  Linda Carlson E

## 2024-08-02 NOTE — Discharge Summary (Signed)
 Patient admitted for acute onset of abdominal pain after  early intercourse  from [facility]. Patient also presented with severe nausea and vomiting. Patient also with diabetes.  She was found to have cuff separation and was repaired on her same admission. She was given zosyn  during her stay as well.  ID recommendations for discharge was for levo/flagyl  for a week after. She was followed with the medicine team and was increased on her lantus  to 16 units in the AM.  She slowly recovered and was discharged on 08/01/24.  She was tolerated a regular diet and voiding and ambulating. Her white count was stable and she was afebrile on discharge as well.  She was discharged in stable condition. She was counseled on strict pelvic rest until cleared and to use twice daily estrogen. She is to follow with me in clinic in one week from discharge or sooner with any concerns. Dr. Glennon

## 2024-08-02 NOTE — ED Provider Notes (Signed)
 Patient was initially seen by Dr. Doretha.  Please see her note.  Patient was just recently discharged from the hospital.  Patient's labs did not show any signs of leukocytosis.  No signs of hepatitis or pancreatitis.  Metabolic panel showed elevated glucose but no other significant abnormalities.  CT angiogram of the chest does not show any [place]sa PE.  CT scan of the abdomen pelvis shows improvement of the previous findings noted with her recent surgery.  No signs of abscess no bowel obstruction.  Pt thinks at this time she may have had a panic attack.  Evaluation and diagnostic testing in the emergency department does not suggest an emergent condition requiring admission or immediate intervention beyond what has been performed at this time.  The patient is safe for discharge and has been instructed to return immediately for worsening symptoms, change in symptoms or any other concerns.    Randol Simmonds, MD 08/02/24 [phone]

## 2024-08-02 NOTE — ED Provider Notes (Signed)
 [place] EMERGENCY DEPARTMENT AT [facility] Provider Note   CSN: [id number] Arrival date & time: 08/02/24  1402     Patient presents with: No chief complaint on file.   Linda Carlson is a 43 y.o. female.   Pt is a 42y/o female with hx of DM, DUB s/p hysterectomy with early intercourse causing cuff separation and repaired on 07/28/24. She was given zosyn  during her stay as well.  ID recommendations for discharge was for levo/flagyl  for a week after which she is taking and was d/ced yesterday is presenting with EMS today due to complaints of abdominal pain, pressure, chest pressure and feeling disoriented.  Patient reports that since getting home she had not been able to get the pain medication from the pharmacy because there was a mixup.  She has had ongoing abdominal pain and pressure.  She reports she was unable to sleep last night due to being so uncomfortable.  She had also been feeling very anxious so took a half a CBD gummy which she reports she is had in the past.  She has not had any alcohol or other medications.  She had been trying to take Tylenol  and ibuprofen  but it did not help with the pain.  She reports this morning she started getting a sharp uncomfortable pain in her chest and a warm sensation.  She has been nauseated and did take some Zofran  at home but denies any vomiting.  EMS noted patient to be hypertensive and route with no significant changes on EKG other than tachycardia.  Patient denies any known cardiac disease.        Prior to Admission medications   Medication Sig Start Date End Date Taking? Authorizing Provider  acetaminophen  (TYLENOL ) 325 MG tablet Take 2 tablets (650 mg total) by mouth every 4 (four) hours as needed for mild pain (pain score 1-3) (temperature > 101.5.). 07/29/24   Glennon Almarie POUR, MD  acetaminophen  (TYLENOL ) 500 MG tablet Take 500 mg by mouth every 6 (six) hours as needed for moderate pain (pain score 4-6).    [provider]  bisacodyl  (DULCOLAX) 10 MG suppository Place 1 suppository (10 mg total) rectally daily as needed for moderate constipation. 07/29/24   Glennon Almarie POUR, MD  docusate sodium  (COLACE) 100 MG capsule Take 1 capsule (100 mg total) by mouth 2 (two) times daily. 07/29/24   Glennon Almarie POUR, MD  estradiol  (ESTRACE ) 0.1 MG/GM vaginal cream Place 1 Applicatorful vaginally daily. 07/29/24   Glennon Almarie POUR, MD  famotidine  (PEPCID ) 40 MG tablet Take 1 tablet (40 mg total) by mouth 2 (two) times daily. 07/29/24   Glennon Almarie POUR, MD  glipiZIDE -metformin  (METAGLIP ) 2.5-500 MG tablet Take 1 tablet by mouth 2 (two) times daily before a meal. Patient not taking: Reported on 07/27/2024 06/01/24   Glennon Almarie POUR, MD  ibuprofen  (ADVIL ) 800 MG tablet Take 1 tablet (800 mg total) by mouth every 8 (eight) hours as needed. 04/06/24   Glennon Almarie POUR, MD  insulin  glargine (LANTUS  SOLOSTAR) 100 UNIT/ML Solostar Pen Inject 10 Units into the skin daily. 06/06/24   Rilla Baller, MD  insulin  glargine-yfgn (SEMGLEE ) 100 UNIT/ML injection Inject 0.16 mLs (16 Units total) into the skin daily. 07/29/24   Glennon Almarie POUR, MD  levofloxacin  (LEVAQUIN ) 750 MG tablet Take 1 tablet (750 mg total) by mouth daily. 07/29/24   Glennon Almarie POUR, MD  metFORMIN  (GLUCOPHAGE ) 500 MG tablet Take 1 tablet by mouth 2 (two) times daily.  [provider]  metoCLOPramide  (REGLAN ) 10 MG tablet Take 1 tablet (10 mg total) by mouth every 8 (eight) hours as needed for nausea. Patient not taking: Reported on 07/27/2024 04/06/24   Glennon Almarie POUR, MD  metroNIDAZOLE  (FLAGYL ) 500 MG tablet Take 1 tablet (500 mg total) by mouth 2 (two) times daily for 14 days. 08/01/24 08/15/24  Glennon Almarie POUR, MD  Multiple Vitamin (MULTIVITAMIN WITH MINERALS) TABS tablet Take 1 tablet by mouth daily. 07/29/24   Glennon Almarie POUR, MD  Multiple Vitamin (MULTIVITAMIN) tablet Take 1 tablet by mouth daily.    [provider]  ondansetron  (ZOFRAN ) 4 MG tablet Take 1 tablet (4 mg total) by mouth every 4 (four) hours as needed for nausea. 07/29/24   Glennon Almarie POUR, MD  oxyCODONE  (OXY IR/ROXICODONE ) 5 MG immediate release tablet Take 1-2 tablets (5-10 mg total) by mouth every 4 (four) hours as needed for moderate pain (pain score 4-6). 07/29/24   Glennon Almarie POUR, MD  polyethylene glycol (MIRALAX  / GLYCOLAX ) 17 g packet Take 17 g by mouth daily as needed for mild constipation. 07/29/24   Glennon Almarie POUR, MD  promethazine  (PHENERGAN ) 12.5 MG suppository Place 1 suppository (12.5 mg total) rectally every 6 (six) hours as needed for nausea or vomiting. 07/29/24   Glennon Almarie POUR, MD  simethicone  (MYLICON) 80 MG chewable tablet Chew 1 tablet (80 mg total) by mouth every 6 (six) hours as needed for flatulence. 07/29/24   Glennon Almarie POUR, MD    Allergies: Patient has no known allergies.    Review of Systems  Updated Vital Signs BP (!) 172/107 (BP Location: Right Arm)   Pulse (!) 110   Temp 98.1 F (36.7 C) (Oral)   Resp 16   LMP 05/11/2024 (Approximate)   SpO2 100%   Physical Exam Vitals and nursing note reviewed.  Constitutional:      General: She is not in acute distress.    Appearance: She is well-developed. She is diaphoretic.  HENT:     Head: Normocephalic and atraumatic.     Mouth/Throat:     Mouth: Mucous membranes are dry.  Eyes:     Pupils: Pupils are equal, round, and reactive to light.  Cardiovascular:     Rate and Rhythm: Regular rhythm. Tachycardia present.     Heart sounds: Normal heart sounds. No murmur heard.    No friction rub.  Pulmonary:     Effort: Pulmonary effort is normal.     Breath sounds: Normal breath sounds. No wheezing or rales.  Abdominal:     General: Bowel sounds are normal. There is no distension.     Palpations: Abdomen is soft.     Tenderness: There is abdominal tenderness. There is no guarding or rebound.     Comments: Diffuse tenderness  with palpation of the abdomen.  Healing surgical sites without evidence of dehiscence or erythema or drainage  Musculoskeletal:        General: No tenderness. Normal range of motion.     Right lower leg: No edema.     Left lower leg: No edema.     Comments: No edema  Skin:    General: Skin is warm.     Findings: No rash.  Neurological:     Mental Status: She is alert and oriented to person, place, and time.     Cranial Nerves: No cranial nerve deficit.     Sensory: No sensory deficit.     Motor: No weakness.  Comments: Patient is mentating normally  Psychiatric:        Behavior: Behavior normal.     (all labs ordered are listed, but only abnormal results are displayed) Labs Reviewed  CBC WITH DIFFERENTIAL/PLATELET  COMPREHENSIVE METABOLIC PANEL WITH GFR  LIPASE, BLOOD  TROPONIN I (HIGH SENSITIVITY)    EKG: None  Radiology: No results found.   Procedures   Medications Ordered in the ED  ondansetron  (ZOFRAN ) injection 4 mg (has no administration in time range)  HYDROmorphone  (DILAUDID ) injection 1 mg (has no administration in time range)  lactated ringers  bolus 1,000 mL (has no administration in time range)                                    Medical Decision Making Amount and/or Complexity of Data Reviewed Labs: ordered. Radiology: ordered.  Risk Prescription drug management.   Pt with multiple medical problems and comorbidities and presenting today with a complaint that caries a high risk for morbidity and mortality.  Here today with the above complaints.  Concern for possible PE given patient's recent surgery and hospitalization versus complication from recent surgery with abdominal infection versus ACS or dysrhythmia.  Also concern for possible worsening pain due to being unable to obtain pain medication for the last 24 hours since discharge.  Pulses are equal in all 4 extremities patient is significantly hypertensive today without history of hypertension.   Feel this could be related to pain however also possibility for dissection.  Patient given IV fluids and pain control.  Labs are pending.  Will need imaging.  I independently interpreted patient's EKG which showed borderline prolonged QT but no other acute changes concerning for ACS.      Final diagnoses:  None    ED Discharge Orders     None          Doretha Folks, MD 08/02/24 1549

## 2024-08-02 NOTE — Telephone Encounter (Signed)
 Dr. Glennon -please resend RX.

## 2024-08-02 NOTE — ED Triage Notes (Signed)
 Pt BIB [facility] from home due to sharp and tearing pain in chest. Pt report warm sensation. Pt had surgery in her abdomen following a hysterectomy that got infected. She was hospitalized for 6 days and got discharged yesterday.  Abdomen currently rigid and painful.  Pt took Zofran  at home. Hx anxiety. Pt did take half of CBD gummy (10 miligram).   20g rigth AC. VS BP 196/106, HR 90, Resp 32

## 2024-08-03 ENCOUNTER — Ambulatory Visit: Admitting: Family Medicine

## 2024-08-03 MED ORDER — OXYCODONE HCL 5 MG PO TABS: 5.0000 mg | ORAL_TABLET | ORAL | 0 refills | Status: DC | PRN

## 2024-08-03 NOTE — Telephone Encounter (Signed)
 Call placed to pharmacy, pharmacy closed, opens at 0900.

## 2024-08-03 NOTE — Telephone Encounter (Signed)
 Spoke with [employer].   2 prescriptions received for oxycodone . One for 30 tab and one for 5 tabs. Was advised the Rx for 30 tabs would need to be updated to 1 tab, not 1-2 tabs, this is too much for 1st Rx.   Advised I need to confirm with Dr. Glennon which prescription she is filling and return call.  Dr. Glennon -please review and update Rx

## 2024-08-05 NOTE — Telephone Encounter (Signed)
 Followed up with [facility] to confirm Rx. Spoke with Garrel.   Was advised Rx for oxycodone  #30 tab was cancelled. Rx for 5 tabs picked up on 08/04/24

## 2024-08-10 ENCOUNTER — Encounter: Payer: Self-pay | Admitting: Obstetrics and Gynecology

## 2024-08-10 ENCOUNTER — Ambulatory Visit (INDEPENDENT_AMBULATORY_CARE_PROVIDER_SITE_OTHER): Admitting: Obstetrics and Gynecology

## 2024-08-10 VITALS — BP 128/60 | HR 88 | Wt 160.2 lb

## 2024-08-10 DIAGNOSIS — Z09 Encounter for follow-up examination after completed treatment for conditions other than malignant neoplasm: Secondary | ICD-10-CM

## 2024-08-10 DIAGNOSIS — K2100 Gastro-esophageal reflux disease with esophagitis, without bleeding: Secondary | ICD-10-CM

## 2024-08-10 DIAGNOSIS — R110 Nausea: Secondary | ICD-10-CM

## 2024-08-10 MED ORDER — ONDANSETRON 4 MG PO TBDP: 4.0000 mg | ORAL_TABLET | ORAL | 0 refills | Status: DC | PRN

## 2024-08-10 MED ORDER — LANTUS SOLOSTAR 100 UNIT/ML ~~LOC~~ SOPN: 18.0000 [IU] | PEN_INJECTOR | Freq: Every day | SUBCUTANEOUS | 3 refills | Status: AC

## 2024-08-10 MED ORDER — FLUCONAZOLE 150 MG PO TABS: 150.0000 mg | ORAL_TABLET | Freq: Every day | ORAL | 1 refills | Status: DC

## 2024-08-10 MED ORDER — FLUCONAZOLE 150 MG PO TABS: 150.0000 mg | ORAL_TABLET | Freq: Once | ORAL | 0 refills | Status: AC

## 2024-08-10 NOTE — Progress Notes (Signed)
   Acute Office Visit  Subjective:    Patient ID: Linda Carlson, female    DOB: 1981/07/18, 43 y.o.   MRN: [id number]   HPI 43 y.o. presents today for Post-op Follow-up (2 week post op/Still having a good amount out pain. Belly button was bleeding yesterday. /06-01-24  robotic hysterectomy, bilateral salpingectomy, cystoscopy, lysis of adhesions/verified) . Last dose of levofloxacin  tomorrow.  To stop flagyl  tomorrow as well. Still having nausea and reflux. Not as much as hospital stay. Sugars in the 200's on 16 of lantus .  She is not taking her oral pills for diabetes with the nausea.   Patient's last menstrual period was 05/11/2024 (approximate).    Review of Systems SVE: cuff intact with sutures pink discharge possible yeast infection     Objective:    OBGyn Exam  BP 128/60   Pulse 88   Wt 160 lb 3.2 oz (72.7 kg)   LMP 05/11/2024 (Approximate)   SpO2 99%   BMI 27.50 kg/m  Wt Readings from Last 3 Encounters:  08/10/24 160 lb 3.2 oz (72.7 kg)  07/27/24 160 lb (72.6 kg)  07/27/24 160 lb (72.6 kg)         Assessment & Plan:  Diflucan  sent for likely yeast with broad coverage antibiotics Continue daily vaginal estrogen cream Increase am lantus  to 18 units. PCP visit in 2 weeks.  Patient encouraged to write down sugars and bring to office.  She has forgot the paper Compliant with lifting restrictions and pelvic rest, per patient.  Counseled on risks with intercourse and to maintain strict pelvic rest for now. Referral placed to GI for evaluation. Zofran  refill sent.  RTC in 2 weeks or sooner  Dr. Glennon Almarie [name] Glennon

## 2024-08-11 ENCOUNTER — Encounter: Payer: Self-pay | Admitting: Gastroenterology

## 2024-08-24 ENCOUNTER — Ambulatory Visit: Admitting: Family Medicine

## 2024-08-25 NOTE — Telephone Encounter (Signed)
 Please advise see message below:  Hi Dr. Glennon. The past couple days I have been struggling with throwing up and severe indigestion. I feel nauseous all the time. It causes me to feel light headed. I made an appointment with the GI specialist you referred me to a few weeks ago. The appointment is not until October 7th though. They said thats the earliest they could see me. Any suggestions?

## 2024-08-26 NOTE — Telephone Encounter (Signed)
 Call placed to patient, left detailed message, ok per dpr. ER precautions provided for persistent vomiting, nausea, lightheadedness, dizziness or unable to eat/drink.   Return call to office to further discuss alternatives for GI. [phone], opt4.

## 2024-08-29 HISTORY — PX: COLONOSCOPY WITH ESOPHAGOGASTRODUODENOSCOPY (EGD): SHX5779

## 2024-08-30 ENCOUNTER — Ambulatory Visit (INDEPENDENT_AMBULATORY_CARE_PROVIDER_SITE_OTHER): Admitting: Obstetrics and Gynecology

## 2024-08-30 VITALS — BP 130/76 | HR 106 | Wt 160.8 lb

## 2024-08-30 DIAGNOSIS — R142 Eructation: Secondary | ICD-10-CM

## 2024-08-30 DIAGNOSIS — R630 Anorexia: Secondary | ICD-10-CM

## 2024-08-30 DIAGNOSIS — K591 Functional diarrhea: Secondary | ICD-10-CM

## 2024-08-30 DIAGNOSIS — Z09 Encounter for follow-up examination after completed treatment for conditions other than malignant neoplasm: Secondary | ICD-10-CM

## 2024-08-30 DIAGNOSIS — R110 Nausea: Secondary | ICD-10-CM

## 2024-08-30 DIAGNOSIS — N898 Other specified noninflammatory disorders of vagina: Secondary | ICD-10-CM

## 2024-08-30 DIAGNOSIS — Z9189 Other specified personal risk factors, not elsewhere classified: Secondary | ICD-10-CM

## 2024-08-30 MED ORDER — PROMETHAZINE HCL 12.5 MG RE SUPP: 12.5000 mg | Freq: Four times a day (QID) | RECTAL | 0 refills | Status: DC | PRN

## 2024-08-30 MED ORDER — METOCLOPRAMIDE HCL 10 MG PO TABS: 10.0000 mg | ORAL_TABLET | Freq: Three times a day (TID) | ORAL | 3 refills | Status: DC | PRN

## 2024-08-30 MED ORDER — PROMETHAZINE HCL 25 MG RE SUPP: 25.0000 mg | Freq: Four times a day (QID) | RECTAL | 0 refills | Status: DC | PRN

## 2024-08-30 MED ORDER — METOCLOPRAMIDE HCL 10 MG PO TABS: 10.0000 mg | ORAL_TABLET | Freq: Three times a day (TID) | ORAL | 0 refills | Status: DC | PRN

## 2024-08-30 NOTE — Telephone Encounter (Signed)
 Left message to call [facility] Triage at [phone], option 4.

## 2024-08-30 NOTE — Patient Instructions (Signed)
 Stop by this lab and grab a sterile cup for the samples Dr. Glennon

## 2024-08-30 NOTE — Addendum Note (Signed)
 Addended by: GLENNON ALMARIE POUR on: 08/30/2024 03:19 PM   Modules accepted: Orders

## 2024-08-30 NOTE — Telephone Encounter (Signed)
Patient seen in office today.   Encounter closed.  

## 2024-08-30 NOTE — Progress Notes (Signed)
   Acute Office Visit  Subjective:    Patient ID: Linda Carlson, female    DOB: 1981-08-24, 43 y.o.   MRN: [id number]   HPI 43 y.o. presents today for Post-op Follow-up (Post-op reck hysterectomy and vaginal repair cuff, 43 y.o. presents today in abdominal area and nausea. She is also having lots of burping which was never the case before.  ) surgery for vaginal cuff repair 07/28/24 Belching is continuous, food feels like it is stuck. Not sleeping well because of the belching. Nausea. Phenergan  suppository. Helps at night to sleep. Cannot get into see GI until October and needs to work and would be hard with the persistent belching and nausea. States her fsbs are in the 160's Just drinking shakes for nutrition. She is miserable. Called [facility]-appointment is not open until next year, Dr. Rollin and they cannot see her with medicaid. No fevers, VB, does report lower abdominal cramping.  Had diarrhea yesterday.  Patient's last menstrual period was 05/11/2024 (approximate).    Review of Systems     Objective:    OBGyn Exam  BP 130/76   Pulse (!) 106   Wt 160 lb 12.8 oz (72.9 kg)   LMP 05/11/2024 (Approximate)   SpO2 98%   BMI 27.60 kg/m  Wt Readings from Last 3 Encounters:  08/30/24 160 lb 12.8 oz (72.9 kg)  08/10/24 160 lb 3.2 oz (72.7 kg)  07/27/24 160 lb (72.6 kg)      SVE: cuff intact. Possible yeast infection, no bleeding  Geni, CMA was present for the exam  Assessment & Plan:  Persistent n/v, belching Linda Carlson to see patient on Thursday and much appreciated.  Patient cannot wait a month or longer to be seen with this state.  To begin reglan  To get h.pylori testing asap  Dr. Glennon Almarie Linda Carlson

## 2024-08-30 NOTE — Addendum Note (Signed)
 Addended by: Shavona Gunderman on: 08/30/2024 03:17 PM   Modules accepted: Orders

## 2024-08-31 ENCOUNTER — Ambulatory Visit (INDEPENDENT_AMBULATORY_CARE_PROVIDER_SITE_OTHER): Admitting: Family Medicine

## 2024-08-31 VITALS — BP 118/82 | HR 97 | Temp 98.5°F | Ht 64.0 in | Wt 161.2 lb

## 2024-08-31 DIAGNOSIS — E1169 Type 2 diabetes mellitus with other specified complication: Secondary | ICD-10-CM

## 2024-08-31 DIAGNOSIS — Z794 Long term (current) use of insulin: Secondary | ICD-10-CM | POA: Diagnosis not present

## 2024-08-31 DIAGNOSIS — R140 Abdominal distension (gaseous): Secondary | ICD-10-CM | POA: Insufficient documentation

## 2024-08-31 LAB — COMPREHENSIVE METABOLIC PANEL WITH GFR
ALT: 30 U/L (ref 0–35)
AST: 15 U/L (ref 0–37)
Albumin: 4.3 g/dL (ref 3.5–5.2)
Alkaline Phosphatase: 34 U/L — ABNORMAL LOW (ref 39–117)
BUN: 26 mg/dL — ABNORMAL HIGH (ref 6–23)
CO2: 28 meq/L (ref 19–32)
Calcium: 10 mg/dL (ref 8.4–10.5)
Chloride: 99 meq/L (ref 96–112)
Creatinine, Ser: 0.82 mg/dL (ref 0.40–1.20)
GFR: 86.78 mL/min (ref >60.00)
Glucose, Bld: 355 mg/dL — ABNORMAL HIGH (ref 70–99)
Potassium: 4.8 meq/L (ref 3.5–5.1)
Sodium: 136 meq/L (ref 135–145)
Total Bilirubin: 0.4 mg/dL (ref 0.2–1.2)
Total Protein: 7.5 g/dL (ref 6.0–8.3)

## 2024-08-31 LAB — SURESWAB® ADVANCED VAGINITIS PLUS,TMA
C. trachomatis RNA, TMA: NOT DETECTED
CANDIDA SPECIES: NOT DETECTED
Candida glabrata: DETECTED — AB
N. gonorrhoeae RNA, TMA: NOT DETECTED
SURESWAB(R) ADV BACTERIAL VAGINOSIS(BV),TMA: NEGATIVE
TRICHOMONAS VAGINALIS (TV),TMA: NOT DETECTED

## 2024-08-31 MED ORDER — LANCETS MISC. MISC: 1.0000 | Freq: Three times a day (TID) | 0 refills | Status: AC

## 2024-08-31 MED ORDER — LANCET DEVICE MISC: 1.0000 | Freq: Three times a day (TID) | 0 refills | Status: AC

## 2024-08-31 MED ORDER — BLOOD GLUCOSE MONITORING SUPPL DEVI: 1.0000 | Freq: Three times a day (TID) | 0 refills | Status: AC

## 2024-08-31 MED ORDER — BLOOD GLUCOSE TEST VI STRP: 1.0000 | ORAL_STRIP | Freq: Three times a day (TID) | 0 refills | Status: AC

## 2024-08-31 NOTE — Assessment & Plan Note (Addendum)
 Notes improving sugar control on lantus  18 daily - continue this.  Will remain off metformin  at this time due to GI symptoms.  Update BMP and fructosamine level today

## 2024-08-31 NOTE — Progress Notes (Signed)
 Ph: [phone] Fax: [phone]   Patient ID: Linda Carlson, female    DOB: 1981-01-24, 43 y.o.   MRN: [id number]  This visit was conducted in person.  BP 118/82   Pulse 97   Temp 98.5 F (36.9 C) (Oral)   Ht 5' 4 (1.626 m)   Wt 161 lb 4 oz (73.1 kg)   LMP 05/11/2024 (Approximate)   SpO2 100%   BMI 27.68 kg/m    CC: DM f/u visit  Subjective:   HPI: Linda Carlson is a 43 y.o. female presenting on 08/31/2024 for Medical Management of Chronic Issues (Pt here for DM f/u)   Remains out of work.   Recent hysterectomy and BSO 06/01/2024. Subsequent hospitalization for cuff separation s/p operative repair 07/28/2024, received IV zosyn . Discharged on levofloxacin  and flagyl  for 1 week per ID recommendations. Due to ongoing nausea, has been referred to gastroenterology - pending appt with [facility] tomorrow.   Has had significant nausea, trouble keeping foods down since second surgery 06/2024. Vomiting and diarrhea after eating. Living off of protein shakes. Significant indigestion, burping, gassiness. She is regularly taking rolaids, gaviscon, zofran  all with only temporary relief.  Loose to watery diarrhea x1, previously was constipated.  Some lower abd cramping but no abd pain.   DM - does regularly check sugars predominantly low 100s, recent highest 160. Compliant with antihyperglycemic regimen which includes: Lantus  solostar pen 18 daily, metformin  500mg  bid - she has been using metformin  very intermittently, about 5 times in the past 4 wks. Denies low sugars or hypoglycemic symptoms. Last diabetic eye exam DUE. Glucometer brand: [employer] off [employer] - planning to buy new meter at [employer]. Last foot exam: 05/2024. DSME: pending appt - has not seen yet due to above complications. Lab Results  Component Value Date   HGBA1C 11.5 (H) 07/27/2024   Diabetic Foot Exam - Simple   No data filed    No results found for: MACKEY CURRENT       Relevant past medical,  surgical, family and social history reviewed and updated as indicated. Interim medical history since our last visit reviewed. Allergies and medications reviewed and updated. Outpatient Medications Prior to Visit  Medication Sig Dispense Refill   acetaminophen  (TYLENOL ) 500 MG tablet Take 500 mg by mouth every 6 (six) hours as needed for moderate pain (pain score 4-6).     BD PEN NEEDLE MINI ULTRAFINE 31G X 5 MM MISC Inject into the skin.     Cal Carb-Mag Hydrox-Simeth (ROLAIDS ADVANCED) 1000-200-40 MG CHEW Chew by mouth.     docusate sodium  (COLACE) 100 MG capsule Take 1 capsule (100 mg total) by mouth 2 (two) times daily. 10 capsule 0   estradiol  (ESTRACE ) 0.1 MG/GM vaginal cream Place 1 Applicatorful vaginally daily. 42.5 g 12   famotidine  (PEPCID ) 40 MG tablet Take 1 tablet (40 mg total) by mouth 2 (two) times daily. 60 tablet 0   ibuprofen  (ADVIL ) 800 MG tablet Take 1 tablet (800 mg total) by mouth every 8 (eight) hours as needed. 30 tablet 1   insulin  glargine (LANTUS  SOLOSTAR) 100 UNIT/ML Solostar Pen Inject 18 Units into the skin daily. 15 mL 3   metFORMIN  (GLUCOPHAGE ) 500 MG tablet Take 1 tablet by mouth 2 (two) times daily.     metoCLOPramide  (REGLAN ) 10 MG tablet Take 1 tablet (10 mg total) by mouth every 8 (eight) hours as needed. 30 tablet 3   Multiple Vitamin (MULTIVITAMIN WITH MINERALS) TABS tablet Take 1 tablet by mouth  daily. 60 tablet 3   ondansetron  (ZOFRAN ) 4 MG tablet Take 1 tablet (4 mg total) by mouth every 4 (four) hours as needed for nausea. 20 tablet 0   ondansetron  (ZOFRAN -ODT) 4 MG disintegrating tablet Take 1 tablet (4 mg total) by mouth every 4 (four) hours as needed for nausea or vomiting. 30 tablet 0   polyethylene glycol (MIRALAX  / GLYCOLAX ) 17 g packet Take 17 g by mouth daily as needed for mild constipation. 14 each 0   promethazine  (PHENERGAN ) 12.5 MG suppository Place 1 suppository (12.5 mg total) rectally every 6 (six) hours as needed for nausea or vomiting. 12  each 0   promethazine  (PHENERGAN ) 25 MG suppository Place 1 suppository (25 mg total) rectally every 6 (six) hours as needed for nausea or vomiting. 12 each 0   simethicone  (MYLICON) 80 MG chewable tablet Chew 1 tablet (80 mg total) by mouth every 6 (six) hours as needed for flatulence. 30 tablet 0   acetaminophen  (TYLENOL ) 325 MG tablet Take 2 tablets (650 mg total) by mouth every 4 (four) hours as needed for mild pain (pain score 1-3) (temperature > 101.5.). 60 tablet 0   bisacodyl  (DULCOLAX) 10 MG suppository Place 1 suppository (10 mg total) rectally daily as needed for moderate constipation. 12 suppository 0   fluconazole  (DIFLUCAN ) 150 MG tablet Take 1 tablet (150 mg total) by mouth daily. Take one tablet now and repeat one dose in 48 hours 2 tablet 1   glipiZIDE -metformin  (METAGLIP ) 2.5-500 MG tablet Take 1 tablet by mouth 2 (two) times daily before a meal. 60 tablet 3   insulin  glargine-yfgn (SEMGLEE ) 100 UNIT/ML injection Inject 0.16 mLs (16 Units total) into the skin daily. 10 mL 11   LANTUS  100 UNIT/ML injection SMARTSIG:16 Unit(s) SUB-Q Daily     levofloxacin  (LEVAQUIN ) 750 MG tablet Take 1 tablet (750 mg total) by mouth daily. 10 tablet 0   metoCLOPramide  (REGLAN ) 10 MG tablet Take 1 tablet (10 mg total) by mouth every 8 (eight) hours as needed for nausea. 10 tablet 0   Multiple Vitamin (MULTIVITAMIN) tablet Take 1 tablet by mouth daily.     oxyCODONE  (OXY IR/ROXICODONE ) 5 MG immediate release tablet Take 1-2 tablets (5-10 mg total) by mouth every 4 (four) hours as needed for moderate pain (pain score 4-6). (Patient not taking: Reported on 08/31/2024) 30 tablet 0   oxyCODONE  (ROXICODONE ) 5 MG immediate release tablet Take 1 tablet (5 mg total) by mouth every 4 (four) hours as needed. (Patient not taking: Reported on 08/31/2024) 5 tablet 0   No facility-administered medications prior to visit.     Per HPI unless specifically indicated in ROS section below Review of Systems  Objective:   BP 118/82   Pulse 97   Temp 98.5 F (36.9 C) (Oral)   Ht 5' 4 (1.626 m)   Wt 161 lb 4 oz (73.1 kg)   LMP 05/11/2024 (Approximate)   SpO2 100%   BMI 27.68 kg/m   Wt Readings from Last 3 Encounters:  08/31/24 161 lb 4 oz (73.1 kg)  08/30/24 160 lb 12.8 oz (72.9 kg)  08/10/24 160 lb 3.2 oz (72.7 kg)      Physical Exam Vitals and nursing note reviewed.  Constitutional:      Appearance: Normal appearance. She is not ill-appearing.  HENT:     Head: Normocephalic and atraumatic.     Mouth/Throat:     Mouth: Mucous membranes are moist.     Pharynx: Oropharynx is clear. No oropharyngeal  exudate.  Eyes:     Extraocular Movements: Extraocular movements intact.     Pupils: Pupils are equal, round, and reactive to light.  Neck:     Thyroid: No thyroid mass or thyromegaly.  Cardiovascular:     Rate and Rhythm: Normal rate and regular rhythm.     Pulses: Normal pulses.     Heart sounds: Normal heart sounds. No murmur heard. Pulmonary:     Effort: Pulmonary effort is normal. No respiratory distress.     Breath sounds: Normal breath sounds. No wheezing, rhonchi or rales.  Abdominal:     General: Bowel sounds are normal. There is no distension.     Palpations: Abdomen is soft. There is no mass.     Tenderness: There is no abdominal tenderness. There is no guarding or rebound.     Hernia: No hernia is present.     Comments: Frequent belching present  Musculoskeletal:     Cervical back: Normal range of motion and neck supple.     Right lower leg: No edema.     Left lower leg: No edema.  Skin:    General: Skin is warm and dry.     Findings: No rash.  Neurological:     Mental Status: She is alert.  Psychiatric:        Mood and Affect: Mood normal.        Behavior: Behavior normal.       Results for orders placed or performed during the hospital encounter of 08/02/24  CBC with Differential/Platelet   Collection Time: 08/02/24  2:13 PM  Result Value Ref Range   WBC 4.8 4.0 -  10.5 K/uL   RBC 4.59 3.87 - 5.11 MIL/uL   Hemoglobin 13.6 12.0 - 15.0 g/dL   HCT 59.8 63.9 - 53.9 %   MCV 87.4 80.0 - 100.0 fL   MCH 29.6 26.0 - 34.0 pg   MCHC 33.9 30.0 - 36.0 g/dL   RDW 87.8 88.4 - 84.4 %   Platelets 416 (H) 150 - 400 K/uL   nRBC 0.0 0.0 - 0.2 %   Neutrophils Relative % 65 %   Neutro Abs 3.0 1.7 - 7.7 K/uL   Lymphocytes Relative 21 %   Lymphs Abs 1.0 0.7 - 4.0 K/uL   Monocytes Relative 8 %   Monocytes Absolute 0.4 0.1 - 1.0 K/uL   Eosinophils Relative 4 %   Eosinophils Absolute 0.2 0.0 - 0.5 K/uL   Basophils Relative 1 %   Basophils Absolute 0.1 0.0 - 0.1 K/uL   Immature Granulocytes 1 %   Abs Immature Granulocytes 0.06 0.00 - 0.07 K/uL   Abs Granulocyte 3.0 1.5 - 6.5 K/uL  Comprehensive metabolic panel with GFR   Collection Time: 08/02/24  2:13 PM  Result Value Ref Range   Sodium 135 135 - 145 mmol/L   Potassium 3.8 3.5 - 5.1 mmol/L   Chloride 101 98 - 111 mmol/L   CO2 21 (L) 22 - 32 mmol/L   Glucose, Bld 256 (H) 70 - 99 mg/dL   BUN 10 6 - 20 mg/dL   Creatinine, Ser 9.44 0.44 - 1.00 mg/dL   Calcium  9.5 8.9 - 10.3 mg/dL   Total Protein 7.4 6.5 - 8.1 g/dL   Albumin  3.4 (L) 3.5 - 5.0 g/dL   AST 37 15 - 41 U/L   ALT 32 0 - 44 U/L   Alkaline Phosphatase 34 (L) 38 - 126 U/L   Total Bilirubin 0.3 0.0 - 1.2  mg/dL   GFR, Estimated >39 >39 mL/min   Anion gap 13 5 - 15  Lipase, blood   Collection Time: 08/02/24  2:13 PM  Result Value Ref Range   Lipase 29 11 - 51 U/L  Troponin I (High Sensitivity)   Collection Time: 08/02/24  2:13 PM  Result Value Ref Range   Troponin I (High Sensitivity) 7 <18 ng/L  I-Stat venous blood gas, ([facility], [facility], [facility])   Collection Time: 08/02/24  3:01 PM  Result Value Ref Range   pH, Ven 7.546 (H) 7.25 - 7.43   pCO2, Ven 28.5 (L) 44 - 60 mmHg   pO2, Ven 165 (H) 32 - 45 mmHg   Bicarbonate 24.7 20.0 - 28.0 mmol/L   TCO2 26 22 - 32 mmol/L   O2 Saturation 100 %   Acid-Base Excess 3.0 (H) 0.0 - 2.0 mmol/L   Sodium 138 135 - 145  mmol/L   Potassium 4.0 3.5 - 5.1 mmol/L   Calcium , Ion 1.07 (L) 1.15 - 1.40 mmol/L   HCT 38.0 36.0 - 46.0 %   Hemoglobin 12.9 12.0 - 15.0 g/dL   Sample type VENOUS   Troponin I (High Sensitivity)   Collection Time: 08/02/24  4:13 PM  Result Value Ref Range   Troponin I (High Sensitivity) 5 <18 ng/L   Lab Results  Component Value Date   LIPASE 29 08/02/2024   Assessment & Plan:   Problem List Items Addressed This Visit     Type 2 diabetes mellitus with other specified complication (HCC) - Primary   Notes improving sugar control on lantus  18 daily - continue this.  Will remain off metformin  at this time due to GI symptoms.  Update BMP and fructosamine level today       Relevant Orders   Comprehensive metabolic panel with GFR   Fructosamine   Gassiness   Marked gassiness with abd bloating, post-prandial intolerance that started after latest GYN surgery.  She is taking multiple OTC remedies including rolaid, gaviscon, zofran , stool softener.  Recent abx course after GYN surgery (iv zosyn , oral levaquin  and metronidazole ).  Rec start simethicone  (Gasx) PRN symptom relief.  Suspect possible diabetic gastroparesis component to symptoms. Reglan  Rx pending (pharmacy was out). R/o H pylori, SIBO.  I'm glad GYN was able to expedite GI evaluation for tomorrow - will await their evaluation.  No additional testing today.         Meds ordered this encounter  Medications   Blood Glucose Monitoring Suppl DEVI    Sig: 1 each by Does not apply route in the morning, at noon, and at bedtime. May substitute to any manufacturer covered by patient's insurance.    Dispense:  1 each    Refill:  0   Glucose Blood (BLOOD GLUCOSE TEST STRIPS) STRP    Sig: 1 each by In Vitro route in the morning, at noon, and at bedtime. May substitute to any manufacturer covered by patient's insurance.    Dispense:  100 strip    Refill:  0   Lancet Device MISC    Sig: 1 each by Does not apply route in the  morning, at noon, and at bedtime. May substitute to any manufacturer covered by patient's insurance.    Dispense:  1 each    Refill:  0   Lancets Misc. MISC    Sig: 1 each by Does not apply route in the morning, at noon, and at bedtime. May substitute to any manufacturer covered by patient's insurance.  Dispense:  100 each    Refill:  0    Orders Placed This Encounter  Procedures   Comprehensive metabolic panel with GFR   Fructosamine    Patient Instructions  Labs today  Try GasX over the counter  Continue current treatment including lantus  18 units daily with option to titrate insulin  dose if fasting sugars rise >150 Stay off metformin  at this time.  Complete H pylori testing.   Follow up plan: Return in about 3 months (around 11/30/2024), or if symptoms worsen or fail to improve, for follow up visit.  Anton Blas, MD

## 2024-08-31 NOTE — Patient Instructions (Addendum)
 Labs today  Try GasX over the counter  Continue current treatment including lantus  18 units daily with option to titrate insulin  dose if fasting sugars rise >150 Stay off metformin  at this time.  Complete H pylori testing.

## 2024-08-31 NOTE — Assessment & Plan Note (Addendum)
 Marked gassiness with abd bloating, post-prandial intolerance that started after latest GYN surgery.  She is taking multiple OTC remedies including rolaid, gaviscon, zofran , stool softener.  Recent abx course after GYN surgery (iv zosyn , oral levaquin  and metronidazole ).  Rec start simethicone  (Gasx) PRN symptom relief.  Suspect possible diabetic gastroparesis component to symptoms. Reglan  Rx pending (pharmacy was out). R/o H pylori, SIBO.  I'm glad GYN was able to expedite GI evaluation for tomorrow - will await their evaluation.  No additional testing today.

## 2024-09-01 ENCOUNTER — Ambulatory Visit: Payer: Self-pay | Admitting: Obstetrics and Gynecology

## 2024-09-01 DIAGNOSIS — B3731 Acute candidiasis of vulva and vagina: Secondary | ICD-10-CM

## 2024-09-01 MED ORDER — FLUCONAZOLE 150 MG PO TABS: 150.0000 mg | ORAL_TABLET | Freq: Every day | ORAL | 0 refills | Status: AC

## 2024-09-01 NOTE — Progress Notes (Signed)
L/M for pt to call back to receive results.

## 2024-09-01 NOTE — Telephone Encounter (Signed)
 Pt was given the results and made aware that the diflucan  150mg  once daily for 3 days was sent into the pharmacy. Verified with pt that her pharmacy is [facility] on [address].

## 2024-09-02 ENCOUNTER — Telehealth: Payer: Self-pay | Admitting: *Deleted

## 2024-09-02 NOTE — Telephone Encounter (Signed)
-----   Message from Linda Carlson sent at 09/01/2024  9:13 AM EDT ----- Just needs follow-up today from GI visit Thank you Dr. Carpen ----- Message ----- From: Brutus Kate SAILOR, RN Sent: 08/30/2024   3:22 PM EDT To: Linda [name] Carpen, MD  Let me know if anything additional is needed.   [facility] ----- Message ----- From: Carlson Linda [name], MD Sent: 08/30/2024   3:03 PM EDT To: Kate SAILOR Brutus, RN  Patient is now here and I need help finding her a GI that works with Medicaid. Can you also help with h.pylori testing Dr. KATHEE

## 2024-09-02 NOTE — Telephone Encounter (Addendum)
 Spoke with patient.  Patient seen by PCP on 9/3, states she was advised to see GI for evaluation. Notes in EPIC.   Seen by GI on 09/01/24. States she was advised symptoms were because of constipation, was given Miralax  and CT scan completed, see results in Care Everywhere. No f/u scheduled.   Patient reports nausea and burping still present. Head is throbbing today. Sips Gatorade, no food since yesterday. Unable to drink Miralax , threw it up. Holding ice on her head for comfort, no thermometer to check temp.   ER precautions provided to patient for continued, new or worsening symptoms. Advised Dr. Glennon is in the OR, will send update. Our office will f/u with any additional recommendations once reviewed. Patient appreciative of call.   Routing update to Dr. Glennon

## 2024-09-03 LAB — FRUCTOSAMINE: Fructosamine: 454 umol/L — ABNORMAL HIGH (ref 205–285)

## 2024-09-05 ENCOUNTER — Ambulatory Visit: Payer: Self-pay | Admitting: Family Medicine

## 2024-09-05 DIAGNOSIS — E1169 Type 2 diabetes mellitus with other specified complication: Secondary | ICD-10-CM

## 2024-09-05 MED ORDER — FREESTYLE LIBRE 3 PLUS SENSOR MISC: 6 refills | Status: AC

## 2024-09-06 NOTE — Telephone Encounter (Signed)
 Call placed to patient, left detailed message, ok per dpr. Advised per Dr. Glennon, return call to provide update on symptoms [phone], opt 4.

## 2024-09-07 ENCOUNTER — Telehealth: Payer: Self-pay

## 2024-09-07 ENCOUNTER — Other Ambulatory Visit (HOSPITAL_COMMUNITY): Payer: Self-pay

## 2024-09-07 NOTE — Telephone Encounter (Signed)
 Call placed to [facility] [phone]. Spoke with [employer]. Provided update on patient symptoms. Advised per Dr. Glennon, requested evaluation with endoscopy and colonoscopy.  Dr. Mcarthur number provided for return call by Dr. Amelie, my direct number provided as well.   Routing to Dr. Glennon LIPS.

## 2024-09-07 NOTE — Telephone Encounter (Signed)
 Pharmacy Patient Advocate Encounter   Received notification from Onbase that prior authorization for [facility] 3 plus sensor is required/requested.   Insurance verification completed.   The patient is insured through [facility] MEDICAID .   Per test claim: PA required; PA submitted to above mentioned insurance via Latent Key/confirmation #/EOC [id number] Status is pending

## 2024-09-08 NOTE — Telephone Encounter (Signed)
 Alfonso from [facility] left message om 09/07/24 at 1621. States she reviewed concerns with Dr. Amelie, wants patient to wait 3 weeks to see how she does on Miralax . Will send update after 3 week f/u.   Call returned to Alfonso, spoke with Saddie, was advised Alfonso not in yet, will have her return call.

## 2024-09-08 NOTE — Telephone Encounter (Signed)
 Spoke with Alfonso at [facility]. They have not spoken to patient to confirm where patient is in treatment, I am uncertain if patient completed recommended bowel prep. Alfonso will reach out to patient for update. I requested Dr. Amelie reach out to Dr. Glennon directly to discuss plan of care, number provided. Alfonso states she will forward this information to Dr. Amelie.

## 2024-09-08 NOTE — Telephone Encounter (Signed)
 See open encounter dated 09/01/24.   Encounter closed.

## 2024-09-12 NOTE — Telephone Encounter (Signed)
 Dr. Glennon -Did Dr. Amelie get in touch with you?

## 2024-09-20 LAB — HM COLONOSCOPY

## 2024-10-04 ENCOUNTER — Ambulatory Visit: Admitting: Gastroenterology

## 2024-10-04 NOTE — Progress Notes (Deleted)
 Chief Complaint:*** Primary GI Doctor:***  HPI:  Patient is a  43  year old female patient with past medical history of *****who was referred to me by Rilla Baller, MD on **** for a evaluation of *** .    Interval History  Patient admits/denies GERD Patient admits/denies dysphagia Patient admits/denies nausea, vomiting, or weight loss  Patient admits/denies altered bowel habits Patient admits/denies abdominal pain Patient admits/denies rectal bleeding   Denies/Admits alcohol Denies/Admits smoking Denies/Admits NSAID use. Denies/Admits they are on blood thinners.  Patients last colonoscopy Patients last EGD  Surgical history:  Patient's family history includes  Wt Readings from Last 3 Encounters:  08/31/24 161 lb 4 oz (73.1 kg)  08/30/24 160 lb 12.8 oz (72.9 kg)  08/10/24 160 lb 3.2 oz (72.7 kg)      Past Medical History:  Diagnosis Date   Adenomyosis of uterus    Anemia    DUB (dysfunctional uterine bleeding)    Dysmenorrhea    Fatty liver 2014   History of abnormal cervical Pap smear    01-21-2023  ASCUS/ +HPV:   03-03-2024  ASCUS   History of cervical dysplasia    03-04-2023  s/p colposcopy  LSIL/  CIN 1   Menorrhagia with irregular cycle    Pain, pelvic, female    Seasonal allergic rhinitis    Type 2 diabetes mellitus (HCC)    followed by pcp;    (04-22-2024 pt stated checks blood sugar 2 times weekly fasting,  average 100-200)   Wears contact lenses     Past Surgical History:  Procedure Laterality Date   CESAREAN SECTION     2009  and  2011   CYSTOSCOPY N/A 06/01/2024   Procedure: CYSTOSCOPY;  Surgeon: Glennon Almarie POUR, MD;  Location: [facility] OR;  Service: Gynecology;  Laterality: N/A;   CYSTOSCOPY N/A 07/28/2024   Procedure: CYSTOSCOPY;  Surgeon: Glennon Almarie POUR, MD;  Location: [facility] OR;  Service: Gynecology;  Laterality: N/A;   HYSTERECTOMY, TOTAL, LAPAROSCOPIC, ROBOT-ASSISTED WITH SALPINGECTOMY Bilateral 06/01/2024   Procedure: HYSTERECTOMY,  TOTAL, LAPAROSCOPIC, ROBOT-ASSISTED WITH SALPINGECTOMY;  Surgeon: Glennon Almarie POUR, MD;  Location: [facility] OR;  Service: Gynecology;  Laterality: Bilateral;   LAPAROSCOPIC TUBAL LIGATION Bilateral 2013   LAPAROSCOPY N/A 07/28/2024   Procedure: OPERATIVE LAPAROSCOPY;  Surgeon: Glennon Almarie POUR, MD;  Location: [facility] OR;  Service: Gynecology;  Laterality: N/A;  with vaginal exam   REPAIR VAGINAL CUFF N/A 07/28/2024   Procedure: REPAIR, VAGINAL CUFF;  Surgeon: Glennon Almarie POUR, MD;  Location: [facility] OR;  Service: Gynecology;  Laterality: N/A;    Current Outpatient Medications  Medication Sig Dispense Refill   acetaminophen  (TYLENOL ) 500 MG tablet Take 500 mg by mouth every 6 (six) hours as needed for moderate pain (pain score 4-6).     BD PEN NEEDLE MINI ULTRAFINE 31G X 5 MM MISC Inject into the skin.     Blood Glucose Monitoring Suppl DEVI 1 each by Does not apply route in the morning, at noon, and at bedtime. [employer] substitute to any manufacturer covered by patient's insurance. 1 each 0   Cal Carb-Mag Hydrox-Simeth (ROLAIDS ADVANCED) 1000-200-40 MG CHEW Chew by mouth.     Continuous Glucose Sensor (FREESTYLE LIBRE 3 PLUS SENSOR) MISC Use to check sugars regularly. Change sensor every 15 days. E11.69, Z79.4 2 each 6   docusate sodium  (COLACE) 100 MG capsule Take 1 capsule (100 mg total) by mouth 2 (two) times daily. 10 capsule 0   estradiol  (ESTRACE ) 0.1 MG/GM vaginal cream  Place 1 Applicatorful vaginally daily. 42.5 g 12   famotidine  (PEPCID ) 40 MG tablet Take 1 tablet (40 mg total) by mouth 2 (two) times daily. 60 tablet 0   ibuprofen  (ADVIL ) 800 MG tablet Take 1 tablet (800 mg total) by mouth every 8 (eight) hours as needed. 30 tablet 1   insulin  glargine (LANTUS  SOLOSTAR) 100 UNIT/ML Solostar Pen Inject 18 Units into the skin daily. 15 mL 3   metFORMIN  (GLUCOPHAGE ) 500 MG tablet Take 1 tablet by mouth 2 (two) times daily.     metoCLOPramide  (REGLAN ) 10 MG tablet Take 1 tablet (10 mg total) by mouth  every 8 (eight) hours as needed. 30 tablet 3   Multiple Vitamin (MULTIVITAMIN WITH MINERALS) TABS tablet Take 1 tablet by mouth daily. 60 tablet 3   ondansetron  (ZOFRAN ) 4 MG tablet Take 1 tablet (4 mg total) by mouth every 4 (four) hours as needed for nausea. 20 tablet 0   ondansetron  (ZOFRAN -ODT) 4 MG disintegrating tablet Take 1 tablet (4 mg total) by mouth every 4 (four) hours as needed for nausea or vomiting. 30 tablet 0   polyethylene glycol (MIRALAX  / GLYCOLAX ) 17 g packet Take 17 g by mouth daily as needed for mild constipation. 14 each 0   promethazine  (PHENERGAN ) 12.5 MG suppository Place 1 suppository (12.5 mg total) rectally every 6 (six) hours as needed for nausea or vomiting. 12 each 0   promethazine  (PHENERGAN ) 25 MG suppository Place 1 suppository (25 mg total) rectally every 6 (six) hours as needed for nausea or vomiting. 12 each 0   simethicone  (MYLICON) 80 MG chewable tablet Chew 1 tablet (80 mg total) by mouth every 6 (six) hours as needed for flatulence. 30 tablet 0   No current facility-administered medications for this visit.    Allergies as of 10/04/2024   (No Known Allergies)    Family History  Problem Relation Age of Onset   Ovarian cancer Mother    Diabetes Mother    Hypertension Mother    Diabetes Father    Heart attack Father    Breast cancer Neg Hx     Review of Systems:    Constitutional: No weight loss, fever, chills, weakness or fatigue HEENT: Eyes: No change in vision               Ears, Nose, Throat:  No change in hearing or congestion Skin: No rash or itching Cardiovascular: No chest pain, chest pressure or palpitations   Respiratory: No SOB or cough Gastrointestinal: See HPI and otherwise negative Genitourinary: No dysuria or change in urinary frequency Neurological: No headache, dizziness or syncope Musculoskeletal: No new muscle or joint pain Hematologic: No bleeding or bruising Psychiatric: No history of depression or anxiety     Physical Exam:  Vital signs: LMP 05/11/2024 (Approximate)   Constitutional:   Pleasant *** female/female appears to be in NAD, Well developed, Well nourished, alert and cooperative Eyes:   PEERL, EOMI. No icterus. Conjunctiva pink. Neck:  Supple Throat: Oral cavity and pharynx without inflammation, swelling or lesion.  Respiratory: Respirations even and unlabored. Lungs clear to auscultation bilaterally.   No wheezes, crackles, or rhonchi.  Cardiovascular: Normal S1, S2. Regular rate and rhythm. No peripheral edema, cyanosis or pallor.  Gastrointestinal:  Soft, nondistended, nontender. No rebound or guarding. Normal bowel sounds. No appreciable masses or hepatomegaly. Rectal:  Not performed.  Anoscopy: Msk:  Symmetrical without gross deformities. Without edema, no deformity or joint abnormality.  Neurologic:  Alert and  oriented x4;  grossly normal neurologically.  Skin:   Dry and intact without significant lesions or rashes.  RELEVANT LABS AND IMAGING: CBC    Latest Ref Rng & Units 08/02/2024    3:01 PM 08/02/2024    2:13 PM 08/01/2024    4:25 AM  CBC  WBC 4.0 - 10.5 K/uL  4.8  5.9   Hemoglobin 12.0 - 15.0 g/dL 87.0  86.3  89.1   Hematocrit 36.0 - 46.0 % 38.0  40.1  31.1   Platelets 150 - 400 K/uL  416  362      CMP     Latest Ref Rng & Units 08/31/2024    1:13 PM 08/02/2024    3:01 PM 08/02/2024    2:13 PM  CMP  Glucose 70 - 99 mg/dL 644   743   BUN 6 - 23 mg/dL 26   10   Creatinine 9.59 - 1.20 mg/dL 9.17   9.44   Sodium 864 - 145 mEq/L 136  138  135   Potassium 3.5 - 5.1 mEq/L 4.8  4.0  3.8   Chloride 96 - 112 mEq/L 99   101   CO2 19 - 32 mEq/L 28   21   Calcium  8.4 - 10.5 mg/dL 89.9   9.5   Total Protein 6.0 - 8.3 g/dL 7.5   7.4   Total Bilirubin 0.2 - 1.2 mg/dL 0.4   0.3   Alkaline Phos 39 - 117 U/L 34   34   AST 0 - 37 U/L 15   37   ALT 0 - 35 U/L 30   32      Lab Results  Component Value Date   TSH 0.56 01/21/2023     Assessment: 1. ***  Plan: 1. ***   Thank  you for the courtesy of this consult. Please call me with any questions or concerns.   Linda Campanella, FNP-C [facility] 10/04/2024, 7:09 AM  Cc: Rilla Baller, MD

## 2024-10-05 ENCOUNTER — Telehealth: Payer: Self-pay | Admitting: Internal Medicine

## 2024-10-05 NOTE — Telephone Encounter (Signed)
Request received to transfer GI care from outside practice to [facility].  We appreciate the interest in our practice, however at this time due to high demand from patients without established GI providers we cannot accommodate this transfer.  Ability to accommodate future transfer requests may change over time and the patient can contact us again in 6-12 months if still interested in being seen at [facility].      °

## 2024-10-05 NOTE — Telephone Encounter (Signed)
 Good Morning Dr.Pyrtle,  DOD AM 10/05/2024   Patient was scheduled for 10/04/24 but appointment had to be cancelled due to her seeing [facility] after scheduling with [facility] on 08/11/24. Patient called and stated she would like to transfer her care to us  due to not feeling better after getting treated at [facility]. Patient needs to be seen for dysphagia and lower abdominal pain. Patient does not have a specific provider she would like to schedule with. Records can be found in Epic. Please advise for future scheduling  Thank you

## 2024-10-22 ENCOUNTER — Emergency Department (HOSPITAL_BASED_OUTPATIENT_CLINIC_OR_DEPARTMENT_OTHER): Admission: EM | Admit: 2024-10-22 | Discharge: 2024-10-22 | Disposition: A | Discharge status: Home / Self Care

## 2024-10-22 ENCOUNTER — Encounter (HOSPITAL_BASED_OUTPATIENT_CLINIC_OR_DEPARTMENT_OTHER): Payer: Self-pay | Admitting: Emergency Medicine

## 2024-10-22 ENCOUNTER — Other Ambulatory Visit: Payer: Self-pay

## 2024-10-22 ENCOUNTER — Emergency Department (HOSPITAL_BASED_OUTPATIENT_CLINIC_OR_DEPARTMENT_OTHER)

## 2024-10-22 DIAGNOSIS — N3001 Acute cystitis with hematuria: Secondary | ICD-10-CM | POA: Diagnosis not present

## 2024-10-22 DIAGNOSIS — E119 Type 2 diabetes mellitus without complications: Secondary | ICD-10-CM | POA: Diagnosis not present

## 2024-10-22 DIAGNOSIS — R112 Nausea with vomiting, unspecified: Secondary | ICD-10-CM

## 2024-10-22 DIAGNOSIS — R748 Abnormal levels of other serum enzymes: Secondary | ICD-10-CM | POA: Diagnosis not present

## 2024-10-22 LAB — COMPREHENSIVE METABOLIC PANEL WITH GFR
ALT: 22 U/L (ref 0–44)
AST: 17 U/L (ref 15–41)
Albumin: 4.5 g/dL (ref 3.5–5.0)
Alkaline Phosphatase: 36 U/L — ABNORMAL LOW (ref 38–126)
Anion gap: 16 — ABNORMAL HIGH (ref 5–15)
BUN: 19 mg/dL (ref 6–20)
CO2: 20 mmol/L — ABNORMAL LOW (ref 22–32)
Calcium: 9.6 mg/dL (ref 8.9–10.3)
Chloride: 102 mmol/L (ref 98–111)
Creatinine, Ser: 0.74 mg/dL (ref 0.44–1.00)
GFR, Estimated: >60 mL/min (ref >60)
Glucose, Bld: 247 mg/dL — ABNORMAL HIGH (ref 70–99)
Potassium: 4 mmol/L (ref 3.5–5.1)
Sodium: 137 mmol/L (ref 135–145)
Total Bilirubin: 0.5 mg/dL (ref 0.0–1.2)
Total Protein: 7.9 g/dL (ref 6.5–8.1)

## 2024-10-22 LAB — CBC WITH DIFFERENTIAL/PLATELET
Abs Immature Granulocytes: 0.01 K/uL (ref 0.00–0.07)
Basophils Absolute: 0 K/uL (ref 0.0–0.1)
Basophils Relative: 1 %
Eosinophils Absolute: 0.2 K/uL (ref 0.0–0.5)
Eosinophils Relative: 4 %
HCT: 37.3 % (ref 36.0–46.0)
Hemoglobin: 12.7 g/dL (ref 12.0–15.0)
Immature Granulocytes: 0 %
Lymphocytes Relative: 35 %
Lymphs Abs: 1.7 K/uL (ref 0.7–4.0)
MCH: 29.8 pg (ref 26.0–34.0)
MCHC: 34 g/dL (ref 30.0–36.0)
MCV: 87.6 fL (ref 80.0–100.0)
Monocytes Absolute: 0.3 K/uL (ref 0.1–1.0)
Monocytes Relative: 7 %
Neutro Abs: 2.5 K/uL (ref 1.7–7.7)
Neutrophils Relative %: 53 %
Platelets: 311 K/uL (ref 150–400)
RBC: 4.26 MIL/uL (ref 3.87–5.11)
RDW: 12.2 % (ref 11.5–15.5)
WBC: 4.8 K/uL (ref 4.0–10.5)
nRBC: 0 % (ref 0.0–0.2)

## 2024-10-22 LAB — URINALYSIS, ROUTINE W REFLEX MICROSCOPIC
Bilirubin Urine: NEGATIVE
Glucose, UA: >=500 mg/dL — AB
Ketones, ur: NEGATIVE mg/dL
Nitrite: NEGATIVE
Protein, ur: >=300 mg/dL — AB
Specific Gravity, Urine: 1.025 (ref 1.005–1.030)
pH: 5.5 (ref 5.0–8.0)

## 2024-10-22 LAB — LIPASE, BLOOD: Lipase: 605 U/L — ABNORMAL HIGH (ref 11–51)

## 2024-10-22 LAB — URINALYSIS, MICROSCOPIC (REFLEX): WBC, UA: >50 WBC/hpf (ref 0–5)

## 2024-10-22 LAB — PREGNANCY, URINE: Preg Test, Ur: NEGATIVE

## 2024-10-22 MED ORDER — CEPHALEXIN 500 MG PO CAPS: 500.0000 mg | ORAL_CAPSULE | Freq: Four times a day (QID) | ORAL | 0 refills | Status: AC

## 2024-10-22 MED ORDER — OXYCODONE HCL 5 MG PO TABS: 5.0000 mg | ORAL_TABLET | ORAL | 0 refills | Status: DC | PRN

## 2024-10-22 MED ORDER — MORPHINE SULFATE (PF) 4 MG/ML IV SOLN
4.0000 mg | Freq: Once | INTRAVENOUS | Status: AC
  Administered 2024-10-22: 4 mg via INTRAVENOUS
  Filled 2024-10-22: qty 1

## 2024-10-22 MED ORDER — ONDANSETRON 4 MG PO TBDP
4.0000 mg | ORAL_TABLET | Freq: Once | ORAL | Status: AC | PRN
  Administered 2024-10-22: 4 mg via ORAL
  Filled 2024-10-22: qty 1

## 2024-10-22 MED ORDER — ONDANSETRON HCL 4 MG/2ML IJ SOLN
4.0000 mg | Freq: Once | INTRAMUSCULAR | Status: AC
  Administered 2024-10-22: 4 mg via INTRAVENOUS
  Filled 2024-10-22: qty 2

## 2024-10-22 MED ORDER — SODIUM CHLORIDE 0.9 % IV SOLN
1.0000 g | Freq: Once | INTRAVENOUS | Status: AC
  Administered 2024-10-22: 1 g via INTRAVENOUS
  Filled 2024-10-22: qty 10

## 2024-10-22 MED ORDER — FENTANYL CITRATE (PF) 50 MCG/ML IJ SOSY
50.0000 ug | PREFILLED_SYRINGE | Freq: Once | INTRAMUSCULAR | Status: AC
  Administered 2024-10-22: 50 ug via INTRAVENOUS
  Filled 2024-10-22: qty 1

## 2024-10-22 MED ORDER — ONDANSETRON 4 MG PO TBDP: 4.0000 mg | ORAL_TABLET | Freq: Three times a day (TID) | ORAL | 0 refills | Status: AC | PRN

## 2024-10-22 MED ORDER — LACTATED RINGERS IV BOLUS
1000.0000 mL | Freq: Once | INTRAVENOUS | Status: AC
Start: 2024-10-22 — End: 2024-10-22
  Administered 2024-10-22: 1000 mL via INTRAVENOUS

## 2024-10-22 MED ORDER — IOHEXOL 300 MG/ML  SOLN
80.0000 mL | Freq: Once | INTRAMUSCULAR | Status: AC | PRN
  Administered 2024-10-22: 80 mL via INTRAVENOUS

## 2024-10-22 NOTE — Discharge Instructions (Signed)
 Follow-up with your primary gastroenterologist.  They will need to recheck your lipase level to ensure this improves.  CT scan did not show any concerning findings other than large stool burden.  Do the bowel purge that you were recommended by Dr. Freddy and take MiraLAX .  He did have a urinary tract infection.  I have sent antibiotics into the pharmacy for you.  You received a dose of this in the emergency department.  Return for any concerning symptoms.  Follow-up with your primary care doctor in the next 3 days for reevaluation.

## 2024-10-22 NOTE — ED Triage Notes (Signed)
 Pt w/ lower middle abd cramping and nausea x 2 days; recent hx of hysterectomy (June) and then a vaginal cuff repair (July); pain feels similar to incident in July; actively vomiting in triage

## 2024-10-22 NOTE — ED Provider Notes (Signed)
 [place] [facility] AT [facility] Provider Note   CSN: [id number] Arrival date & time: 10/22/24  1342     Patient presents with: Abdominal Pain and Emesis   Noele Icenhour is a 43 y.o. female.   44 year old female presents today for concern of lower abdominal pain, nausea.  Has been going on for 2 days and has progressively worsened.  She reports history of hysterectomy which was in June and then she had couple sutures that became undone so she underwent vaginal cuff repair in July.  She states pain feels similar to when the sutures became undone.  Also endorses dysuria and some hematuria.  No vaginal bleeding.  Denies history of kidney stones.  The history is provided by the patient. No language interpreter was used.       Prior to Admission medications   Medication Sig Start Date End Date Taking? Authorizing Provider  acetaminophen  (TYLENOL ) 500 MG tablet Take 500 mg by mouth every 6 (six) hours as needed for moderate pain (pain score 4-6).    [provider]  BD PEN NEEDLE MINI ULTRAFINE 31G X 5 MM MISC Inject into the skin. 06/06/24   [provider]  Blood Glucose Monitoring Suppl DEVI 1 each by Does not apply route in the morning, at noon, and at bedtime. May substitute to any manufacturer covered by patient's insurance. 08/31/24   Rilla Baller, MD  Cal Carb-Mag Hydrox-Simeth [facility] ADVANCED) 1000-200-40 MG CHEW Chew by mouth.    [provider]  Continuous Glucose Sensor (FREESTYLE LIBRE 3 PLUS SENSOR) MISC Use to check sugars regularly. Change sensor every 15 days. E11.69, Z79.4 09/05/24   Rilla Baller, MD  docusate sodium  (COLACE) 100 MG capsule Take 1 capsule (100 mg total) by mouth 2 (two) times daily. 07/29/24   Glennon Almarie POUR, MD  estradiol  (ESTRACE ) 0.1 MG/GM vaginal cream Place 1 Applicatorful vaginally daily. 07/29/24   Glennon Almarie POUR, MD  famotidine  (PEPCID ) 40 MG tablet Take 1 tablet (40 mg total) by  mouth 2 (two) times daily. 07/29/24   Glennon Almarie POUR, MD  ibuprofen  (ADVIL ) 800 MG tablet Take 1 tablet (800 mg total) by mouth every 8 (eight) hours as needed. 04/06/24   Glennon Almarie POUR, MD  insulin  glargine (LANTUS  SOLOSTAR) 100 UNIT/ML Solostar Pen Inject 18 Units into the skin daily. 08/10/24   Glennon Almarie POUR, MD  metFORMIN  (GLUCOPHAGE ) 500 MG tablet Take 1 tablet by mouth 2 (two) times daily.    [provider]  metoCLOPramide  (REGLAN ) 10 MG tablet Take 1 tablet (10 mg total) by mouth every 8 (eight) hours as needed. 08/30/24   Glennon Almarie POUR, MD  Multiple Vitamin (MULTIVITAMIN WITH MINERALS) TABS tablet Take 1 tablet by mouth daily. 07/29/24   Glennon Almarie POUR, MD  ondansetron  (ZOFRAN ) 4 MG tablet Take 1 tablet (4 mg total) by mouth every 4 (four) hours as needed for nausea. 07/29/24   Glennon Almarie POUR, MD  ondansetron  (ZOFRAN -ODT) 4 MG disintegrating tablet Take 1 tablet (4 mg total) by mouth every 4 (four) hours as needed for nausea or vomiting. 08/10/24   Glennon Almarie POUR, MD  polyethylene glycol (MIRALAX  / GLYCOLAX ) 17 g packet Take 17 g by mouth daily as needed for mild constipation. 07/29/24   Glennon Almarie POUR, MD  promethazine  (PHENERGAN ) 12.5 MG suppository Place 1 suppository (12.5 mg total) rectally every 6 (six) hours as needed for nausea or vomiting. 08/30/24   Glennon Almarie POUR, MD  promethazine  (PHENERGAN ) 25  MG suppository Place 1 suppository (25 mg total) rectally every 6 (six) hours as needed for nausea or vomiting. 08/30/24   Glennon Almarie POUR, MD  simethicone  (MYLICON) 80 MG chewable tablet Chew 1 tablet (80 mg total) by mouth every 6 (six) hours as needed for flatulence. 07/29/24   Glennon Almarie POUR, MD    Allergies: Patient has no known allergies.    Review of Systems  Constitutional:  Negative for chills and fever.  Respiratory:  Negative for shortness of breath.   Gastrointestinal:  Positive for abdominal pain, nausea and vomiting.   Genitourinary:  Positive for hematuria. Negative for dysuria, flank pain, frequency and vaginal bleeding.  All other systems reviewed and are negative.   Updated Vital Signs BP (!) 138/96 (BP Location: Right Arm)   Pulse (!) 104   Temp 98.8 F (37.1 C)   Resp 16   Ht 5' 4 (1.626 m)   Wt 72.6 kg   LMP 05/11/2024 (Approximate)   SpO2 100%   BMI 27.46 kg/m   Physical Exam Vitals and nursing note reviewed.  Constitutional:      General: She is not in acute distress.    Appearance: Normal appearance. She is not ill-appearing.  HENT:     Head: Normocephalic and atraumatic.     Nose: Nose normal.  Eyes:     Conjunctiva/sclera: Conjunctivae normal.  Cardiovascular:     Rate and Rhythm: Normal rate and regular rhythm.  Pulmonary:     Effort: Pulmonary effort is normal. No respiratory distress.  Abdominal:     General: There is no distension.     Tenderness: There is abdominal tenderness. There is guarding (Lower abdomen mostly suprapubic). There is no right CVA tenderness or left CVA tenderness.  Musculoskeletal:        General: No deformity.  Skin:    Findings: No rash.  Neurological:     Mental Status: She is alert.     (all labs ordered are listed, but only abnormal results are displayed) Labs Reviewed  CBC WITH DIFFERENTIAL/PLATELET  LIPASE, BLOOD  COMPREHENSIVE METABOLIC PANEL WITH GFR  URINALYSIS, ROUTINE W REFLEX MICROSCOPIC    EKG: None  Radiology: No results found.   Procedures   Medications Ordered in the ED  morphine  (PF) 4 MG/ML injection 4 mg (has no administration in time range)  lactated ringers  bolus 1,000 mL (has no administration in time range)  ondansetron  (ZOFRAN -ODT) disintegrating tablet 4 mg (4 mg Oral Given 10/22/24 1409)    Clinical Course as of 10/22/24 1834  Sat Oct 22, 2024  1545 UA positive for UTI. Lipase elevated to 605.  Will further get CT looking for pyelonephritis and pancreatitis.  Will provide Rocephin  in the  meantime. [AA]  1633 CT with moderate to large stool burden otherwise no acute process.  Lipase elevated suggestive of pancreatitis however no evidence of acute pancreatitis on the CT scan.  Pain description also not consistent with pancreatitis. No evidence of pneumoperitoneum.  Low suspicion for vaginal cuff tear especially in the setting of workup revealing UTI.  No evidence of acute abdomen.  Symptoms going on for at least 2 days. [AA]  1745 Spoke to [facility] who states this patient should go to [place] [facility] because they are unassigned.  Will page out again. [AA]  1803 Lipase(!): 605 [AA]  1804 Spoke to Dr. Kriss with [facility] gastroenterology.  She states the lipase elevation could be reactive in the setting of her infection especially if the story does  not fit pancreatitis.  Which it does not.  She recommends follow-up in the outpatient setting with her primary gastroenterologist to have this rechecked to ensure that it resolves.  Also recommend that patient do the bowel purge that she was recommended by her outpatient gastroenterologist given the stool burden.  She does not feel patient requires inpatient workup for her lipase. [AA]    Clinical Course User Index [AA] Hildegard Loge, PA-C                                 Medical Decision Making Amount and/or Complexity of Data Reviewed Labs: ordered. Decision-making details documented in ED Course. Radiology: ordered.  Risk Prescription drug management.   Medical Decision Making / ED Course   This patient presents to the ED for concern of abdominal pain, nausea, vomiting, this involves an extensive number of treatment options, and is a complaint that carries with it a high risk of complications and morbidity.  The differential diagnosis includes gastroenteritis, pyelonephritis, UTI, pneumoperitoneum, vaginal cuff dehiscence, colitis, diverticulitis, nephrolithiasis  MDM: 43 year old female presents today for concern of lower  abdominal pain with associated nausea, vomiting, dysuria, and hematuria. Reports history of hysterectomy and vaginal cuff dehiscence with repair done in July. Symptoms ongoing for 2 days with progressive worsening. Without acute abdomen but does have significant tenderness with guarding.  No distention.  No CVA tenderness. Will provide symptom control and obtain CT, blood work, and provide fluids and pain control.  CBC without leukocytosis or anemia.  CMP with preserved renal function, glucose of 247, mild elevation in anion gap.  Fluid bolus provided.  Symptoms improved.  Lipase was elevated at 605.  Discussion had with gastroenterology as noted above.  Rocephin  given for UTI.  Keflex prescribed and urine culture sent.  Bowel purge and bowel regimen discussed with patient.  She was given bowel purge by her gastroenterologist.  Patient agreeable with discharge and close follow-up with her gastroenterologist to have the repeat lipase performed.  Discussed close follow-up with PCP for reevaluation.  Strict return precautions discussed.  Discharged in stable condition.   Additional history obtained: -Additional history obtained from chart review -External records from outside source obtained and reviewed including: Chart review including previous notes, labs, imaging, consultation notes   Lab Tests: -I ordered, reviewed, and interpreted labs.   The pertinent results include:   Labs Reviewed  CBC WITH DIFFERENTIAL/PLATELET  LIPASE, BLOOD  COMPREHENSIVE METABOLIC PANEL WITH GFR  URINALYSIS, ROUTINE W REFLEX MICROSCOPIC      EKG  EKG Interpretation Date/Time:    Ventricular Rate:    PR Interval:    QRS Duration:    QT Interval:    QTC Calculation:   R Axis:      Text Interpretation:           Imaging Studies ordered: I ordered imaging studies including CT abdomen pelvis with contrast I independently visualized and interpreted imaging. I agree with the radiologist  interpretation   Medicines ordered and prescription drug management: Meds ordered this encounter  Medications  . ondansetron  (ZOFRAN -ODT) disintegrating tablet 4 mg  . morphine  (PF) 4 MG/ML injection 4 mg  . lactated ringers  bolus 1,000 mL    -I have reviewed the patients home medicines and have made adjustments as needed   Consultations Obtained: I requested consultation with the gastroenterology,  and discussed lab and imaging findings as well as pertinent plan - they recommend: As  above   Reevaluation: After the interventions noted above, I reevaluated the patient and found that they have :improved  Co morbidities that complicate the patient evaluation . Past Medical History:  Diagnosis Date  . Adenomyosis of uterus   . Anemia   . DUB (dysfunctional uterine bleeding)   . Dysmenorrhea   . Fatty liver 2014  . History of abnormal cervical Pap smear    01-21-2023  ASCUS/ +HPV:   03-03-2024  ASCUS  . History of cervical dysplasia    03-04-2023  s/p colposcopy  LSIL/  CIN 1  . Menorrhagia with irregular cycle   . Pain, pelvic, female   . Seasonal allergic rhinitis   . Type 2 diabetes mellitus (HCC)    followed by pcp;    (04-22-2024 pt stated checks blood sugar 2 times weekly fasting,  average 100-200)  . Wears contact lenses       Dispostion: Discharged in stable condition.  Return precaution discussed.  Patient voices understanding and is in agreement with plan.   Final diagnoses:  Nausea and vomiting, unspecified vomiting type  Acute cystitis with hematuria  Elevated lipase    ED Discharge Orders          Ordered    cephALEXin (KEFLEX) 500 MG capsule  4 times daily        10/22/24 1828    oxyCODONE  (ROXICODONE ) 5 MG immediate release tablet  Every 4 hours PRN        10/22/24 1828    ondansetron  (ZOFRAN -ODT) 4 MG disintegrating tablet  Every 8 hours PRN        10/22/24 1828               Hildegard Loge, PA-C 10/22/24 1834    Ula Prentice SAUNDERS,  MD 10/23/24 [phone]

## 2024-10-22 NOTE — ED Notes (Signed)
 Pt returned from CT, tolerated well. Placed back on monitor. Medicated per emar.

## 2024-10-22 NOTE — ED Notes (Signed)
 DC instructions given, pt verbalized understanding. Out of ED with all belongings and paperwork, not in visible distress.

## 2024-10-25 ENCOUNTER — Encounter: Payer: Self-pay | Admitting: Family Medicine

## 2024-10-26 ENCOUNTER — Telehealth (HOSPITAL_BASED_OUTPATIENT_CLINIC_OR_DEPARTMENT_OTHER): Payer: Self-pay | Admitting: *Deleted

## 2024-10-26 ENCOUNTER — Other Ambulatory Visit: Payer: Self-pay | Admitting: Family Medicine

## 2024-10-26 ENCOUNTER — Other Ambulatory Visit (HOSPITAL_COMMUNITY)
Admission: RE | Admit: 2024-10-26 | Discharge: 2024-10-26 | Disposition: A | Discharge status: Home / Self Care | Attending: Emergency Medicine | Admitting: Emergency Medicine

## 2024-10-26 ENCOUNTER — Telehealth (HOSPITAL_COMMUNITY): Payer: Self-pay | Admitting: Pharmacy Technician

## 2024-10-26 DIAGNOSIS — R8290 Unspecified abnormal findings in urine: Secondary | ICD-10-CM

## 2024-10-26 DIAGNOSIS — N3090 Cystitis, unspecified without hematuria: Secondary | ICD-10-CM

## 2024-10-26 DIAGNOSIS — N941 Unspecified dyspareunia: Secondary | ICD-10-CM

## 2024-10-26 NOTE — Progress Notes (Signed)
 ED Antimicrobial Stewardship Positive Culture Follow Up   Koraline Phillipson is an 43 y.o. female who presented to [facility] on 10/22/2024 with a chief complaint of  Chief Complaint  Patient presents with   Abdominal Pain   Emesis    Recent Results (from the past 720 hours)  Urine Culture     Status: Abnormal (Preliminary result)   Collection Time: 10/22/24  2:05 PM   Specimen: Urine, Clean Catch  Result Value Ref Range Status   Specimen Description   Final    URINE, CLEAN CATCH Performed at [facility], [address] [place], [place] [address]    Special Requests   Final    NONE Performed at [facility], [address] [place], [place] [address]    Culture (A)  Final    >=100,000 COLONIES/mL MORGANELLA MORGANII UNABLE TO PROVIDE SUSCEPTIBILITY DUE TO DRUGS TERMINATION. CALL MICROBIOLOGY LAB IF SENSITIVITIES ARE REQUIRED. Performed at [facility], [address], [place], [place] [address]    Report Status PENDING  Incomplete    Plan: D/c Keflex as Morganella species resistant, contacted lab to initiate send out for sensitivity testing however will take days; Give Cipro  empirically as local sensitivities are >80% and follow up send out sensitivity results if needed  ED Provider: Cameron Arlington, PA-C   Dorn Poot 10/26/2024, 8:20 AM Clinical Pharmacist Monday - Friday phone -  [phone] Saturday - Sunday phone - [phone]

## 2024-10-26 NOTE — Telephone Encounter (Signed)
 Pt seen at ER over weekend with abd pain, nausea, diagnosed with UTI growing MORGANELLA MORGANII however sensitivities are not available - [facility] says to call microbiology to ask for sensitivities - can we call microbiology and ask for sensitivities to be run?  May need assistance from our lab team to get this done.  She was started on keflex but unsure if this is sufficient to cover her UTI.   She will need f/u of elevated lipase (CT didn't show pancreatitis) - either with her GI doctor or at our office. Please call to offer ER f/u visit to review all of this.

## 2024-10-26 NOTE — Telephone Encounter (Signed)
 Post ED Visit - Positive Culture Follow-up: Successful Patient Follow-Up  Culture assessed and recommendations reviewed by:  [x]  Dorn Poot, Pharm.D. []  Venetia Gully, Pharm.D., BCPS AQ-ID []  Garrel Crews, Pharm.D., BCPS []  Almarie Lunger, Pharm.D., BCPS []  [place], [place].D., BCPS, AAHIVP []  Rosaline Bihari, Pharm.D., BCPS, AAHIVP []  Vernell Meier, PharmD, BCPS []  Latanya Hint, PharmD, BCPS []  Donald Medley, PharmD, BCPS []  Rocky Bold, PharmD  Positive urine culture  []  Patient discharged without antimicrobial prescription and treatment is now indicated [x]  Organism is resistant to prescribed ED discharge antimicrobial []  Patient with positive blood cultures  Changes discussed with ED provider: Cameron Servant, PA-C New antibiotic prescription Cipro  500 mg po BID x 3 days Called to [facility] on [address]  Contacted patient, date 10/26/24, time 1305   Linda Carlson 10/26/2024, 1:48 PM

## 2024-10-26 NOTE — Telephone Encounter (Signed)
 Spoke with our lab they are going to reach out to get sensitivities ran. Once we know something we will need to call and give other information to patient.

## 2024-10-26 NOTE — Telephone Encounter (Signed)
 Post ED Visit - Positive Culture Follow-up: Unsuccessful Patient Follow-up  Culture assessed and recommendations reviewed by:  [x]  Dorn Poot, Pharm.D. []  Venetia Gully, Pharm.D., BCPS AQ-ID []  Garrel Crews, Pharm.D., BCPS []  Almarie Lunger, [address].D., BCPS []  [place], [place].D., BCPS, AAHIVP []  Rosaline Bihari, Pharm.D., BCPS, AAHIVP []  Massie Rigg, PharmD []  Jodie Rower, PharmD, BCPS  Positive urine culture  []  Patient discharged without antimicrobial prescription and treatment is now indicated [x]  Organism is resistant to prescribed ED discharge antimicrobial []  Patient with positive blood cultures   Unable to contact patient after 3 attempts; left VM.  If no call back received, letter will be sent to address on file  Linda Carlson 10/26/2024, 10:47 AM

## 2024-10-26 NOTE — Telephone Encounter (Signed)
 See associated note updated under 10/25 ED encounter. This encounter for lab order only

## 2024-10-27 NOTE — Telephone Encounter (Signed)
 Called patient she got call from ED yesterday they have changed her abx to cipro . She will pick up today and start. She has not had any changes in her symptoms currently but she has not started new meds yet. I have scheduled for f/u on 11/01/24 at 2 pm. She will call if any changes or questions.

## 2024-10-28 NOTE — Telephone Encounter (Signed)
 Noted! Thank you

## 2024-10-31 LAB — SUSCEPTIBILITY RESULT

## 2024-11-01 ENCOUNTER — Encounter: Payer: Self-pay | Admitting: Family Medicine

## 2024-11-01 ENCOUNTER — Ambulatory Visit (INDEPENDENT_AMBULATORY_CARE_PROVIDER_SITE_OTHER): Admitting: Family Medicine

## 2024-11-01 VITALS — BP 118/82 | HR 98 | Temp 98.4°F | Ht 64.0 in | Wt 162.5 lb

## 2024-11-01 DIAGNOSIS — N3000 Acute cystitis without hematuria: Secondary | ICD-10-CM

## 2024-11-01 DIAGNOSIS — R1085 Abdominal pain of multiple sites: Secondary | ICD-10-CM | POA: Diagnosis not present

## 2024-11-01 DIAGNOSIS — E1169 Type 2 diabetes mellitus with other specified complication: Secondary | ICD-10-CM | POA: Diagnosis not present

## 2024-11-01 DIAGNOSIS — Z794 Long term (current) use of insulin: Secondary | ICD-10-CM | POA: Diagnosis not present

## 2024-11-01 LAB — SUSCEPTIBILITY, AER + ANAEROB

## 2024-11-01 LAB — MISC LABCORP TEST (SEND OUT): Labcorp test code: 8680

## 2024-11-01 MED ORDER — METFORMIN HCL ER 500 MG PO TB24: 500.0000 mg | ORAL_TABLET | Freq: Every day | ORAL | 3 refills | Status: AC

## 2024-11-01 NOTE — Progress Notes (Unsigned)
 Ph: [phone] Fax: [phone]   Patient ID: Linda Carlson, female    DOB: 06-May-1981, 43 y.o.   MRN: [id number]  This visit was conducted in person.  BP 118/82   Pulse 98   Temp 98.4 F (36.9 C) (Oral)   Ht 5' 4 (1.626 m)   Wt 162 lb 8 oz (73.7 kg)   LMP 05/11/2024 (Approximate)   SpO2 99%   BMI 27.89 kg/m   BP Readings from Last 3 Encounters:  11/01/24 118/82  10/22/24 136/87  08/31/24 118/82    CC: ER f/u visit  Subjective:   HPI: Linda Carlson is a 43 y.o. female presenting on 11/01/2024 for Follow-up (Pt here for ED f/u N/V and abdominal pain that radiates to the back. Pt states she has a bruise on the lower right side. Started 10/22/24. Also had dizziness and fell. )   See prior note for details.  Recent ER visit, records reviewed.  Seen for abd pain, nausea, diagnosed with UTI growing MORGANELLA MORGANII. Sensitivities returned - abx was changed from Keflex to Cipro . UTI symptoms are better. Also had elevated lipase however contrasted CT without signs of pancreatitis. Needs lipase rechecked.   Abd pain started ~2 weeks ago.  Continues having periumbilical pain with radiation to the back.  She did have a fall 10/28/2024 after she felt lightheaded. This happened at work. Doesn't remember details of fall. No seizure activity, no syncope or LOC.  Notes new bruise to R lower abdomen.   She subsequently stopped all medications except lantus  20u daily - as she felt other medications weren't really helping and may have been causing more side effects (stopped 09/20/2024).  GI symptoms have improved off metformin , reglan , omeprazole, pepcid .  Reglan  caused tremors.  Notes ongoing gassiness, belching and nausea with early satiety and abd discomfort as per above, but denies dysphagia, weight changes, vomiting, diarrhea or constipation. Recent CT imaging reassuring.   Sugars run 200-250 fasting. Her insurance is not approving CGM - FL3+ sample provided today.  She  has seen [facility] Dr Amelie in [place] s/p EGD and colonoscopy 08/2024.  She did have esophagus stretched with dysphagia improvement.  GI recommendations per note: -No blockage or obstruction was found to explain the patient's frequent belching or nausea or vomiting -Patient has been advised to start pantoprazole  40 mg twice daily -We will stop famotidine  -I have asked her to eliminate any sugary foods or drinks from her diet -I have asked her to avoid lactose products -Avoid carbonated beverages   Rec rpt colonoscopy in 3 yrs for precancerous polyps.  [facility] did not see her as she had already established with [facility].   Notes father with h/o pancreatitis and diabetes.  No significant alcohol use.      Relevant past medical, surgical, family and social history reviewed and updated as indicated. Interim medical history since our last visit reviewed. Allergies and medications reviewed and updated. Outpatient Medications Prior to Visit  Medication Sig Dispense Refill   acetaminophen  (TYLENOL ) 500 MG tablet Take 500 mg by mouth every 6 (six) hours as needed for moderate pain (pain score 4-6).     BD PEN NEEDLE MINI ULTRAFINE 31G X 5 MM MISC Inject into the skin.     Blood Glucose Monitoring Suppl DEVI 1 each by Does not apply route in the morning, at noon, and at bedtime. May substitute to any manufacturer covered by patient's insurance. 1 each 0   Continuous Glucose Sensor (FREESTYLE LIBRE 3 PLUS  SENSOR) MISC Use to check sugars regularly. Change sensor every 15 days. E11.69, Z79.4 2 each 6   ibuprofen  (ADVIL ) 800 MG tablet Take 1 tablet (800 mg total) by mouth every 8 (eight) hours as needed. 30 tablet 1   insulin  glargine (LANTUS  SOLOSTAR) 100 UNIT/ML Solostar Pen Inject 18 Units into the skin daily. 15 mL 3   ondansetron  (ZOFRAN -ODT) 4 MG disintegrating tablet Take 1 tablet (4 mg total) by mouth every 8 (eight) hours as needed. 20 tablet 0   polyethylene glycol (MIRALAX  / GLYCOLAX )  17 g packet Take 17 g by mouth daily as needed for mild constipation. 14 each 0   metFORMIN  (GLUCOPHAGE ) 500 MG tablet Take 1 tablet by mouth 2 (two) times daily.     Cal Carb-Mag Hydrox-Simeth (ROLAIDS ADVANCED) 1000-200-40 MG CHEW Chew by mouth. (Patient not taking: Reported on 11/01/2024)     docusate sodium  (COLACE) 100 MG capsule Take 1 capsule (100 mg total) by mouth 2 (two) times daily. (Patient not taking: Reported on 11/01/2024) 10 capsule 0   estradiol  (ESTRACE ) 0.1 MG/GM vaginal cream Place 1 Applicatorful vaginally daily. (Patient not taking: Reported on 11/01/2024) 42.5 g 12   famotidine  (PEPCID ) 40 MG tablet Take 1 tablet (40 mg total) by mouth 2 (two) times daily. (Patient not taking: Reported on 11/01/2024) 60 tablet 0   metoCLOPramide  (REGLAN ) 10 MG tablet Take 1 tablet (10 mg total) by mouth every 8 (eight) hours as needed. (Patient not taking: Reported on 11/01/2024) 30 tablet 3   Multiple Vitamin (MULTIVITAMIN WITH MINERALS) TABS tablet Take 1 tablet by mouth daily. (Patient not taking: Reported on 11/01/2024) 60 tablet 3   ondansetron  (ZOFRAN ) 4 MG tablet Take 1 tablet (4 mg total) by mouth every 4 (four) hours as needed for nausea. (Patient not taking: Reported on 11/01/2024) 20 tablet 0   oxyCODONE  (ROXICODONE ) 5 MG immediate release tablet Take 1 tablet (5 mg total) by mouth every 4 (four) hours as needed for severe pain (pain score 7-10). (Patient not taking: Reported on 11/01/2024) 10 tablet 0   promethazine  (PHENERGAN ) 12.5 MG suppository Place 1 suppository (12.5 mg total) rectally every 6 (six) hours as needed for nausea or vomiting. (Patient not taking: Reported on 11/01/2024) 12 each 0   promethazine  (PHENERGAN ) 25 MG suppository Place 1 suppository (25 mg total) rectally every 6 (six) hours as needed for nausea or vomiting. (Patient not taking: Reported on 11/01/2024) 12 each 0   simethicone  (MYLICON) 80 MG chewable tablet Chew 1 tablet (80 mg total) by mouth every 6 (six) hours as  needed for flatulence. (Patient not taking: Reported on 11/01/2024) 30 tablet 0   No facility-administered medications prior to visit.     Per HPI unless specifically indicated in ROS section below Review of Systems  Objective:  BP 118/82   Pulse 98   Temp 98.4 F (36.9 C) (Oral)   Ht 5' 4 (1.626 m)   Wt 162 lb 8 oz (73.7 kg)   LMP 05/11/2024 (Approximate)   SpO2 99%   BMI 27.89 kg/m   Wt Readings from Last 3 Encounters:  11/01/24 162 lb 8 oz (73.7 kg)  10/22/24 160 lb (72.6 kg)  08/31/24 161 lb 4 oz (73.1 kg)      Physical Exam Vitals and nursing note reviewed.  Constitutional:      Appearance: Normal appearance. She is not ill-appearing.  HENT:     Head: Normocephalic and atraumatic.     Mouth/Throat:     Mouth:  Mucous membranes are moist.     Pharynx: Oropharynx is clear. No oropharyngeal exudate or posterior oropharyngeal erythema.  Eyes:     Extraocular Movements: Extraocular movements intact.     Pupils: Pupils are equal, round, and reactive to light.  Cardiovascular:     Rate and Rhythm: Normal rate and regular rhythm.     Pulses: Normal pulses.     Heart sounds: Normal heart sounds. No murmur heard. Pulmonary:     Effort: Pulmonary effort is normal. No respiratory distress.     Breath sounds: Normal breath sounds. No wheezing, rhonchi or rales.  Abdominal:     General: Bowel sounds are normal. There is no distension.     Palpations: Abdomen is soft. There is no mass.     Tenderness: There is generalized abdominal tenderness (mild) and tenderness in the right lower quadrant, periumbilical area, suprapubic area and left lower quadrant. There is no right CVA tenderness, left CVA tenderness, guarding or rebound. Negative signs include Murphy's sign.     Hernia: No hernia is present.  Musculoskeletal:     Cervical back: Normal range of motion and neck supple.     Right lower leg: No edema.     Left lower leg: No edema.  Skin:    General: Skin is warm and dry.      Findings: Bruising (RLQ abd) present. No rash.  Neurological:     Mental Status: She is alert.  Psychiatric:        Mood and Affect: Mood normal.        Behavior: Behavior normal.       Results for orders placed or performed in visit on 10/26/24  Susceptibility, Aer + Anaerob   Collection Time: 10/26/24  8:18 AM  Result Value Ref Range   Suscept, Aer + Anaerob Final report (A)    Source URINE ORG   Susceptibility Result   Collection Time: 10/26/24  8:18 AM  Result Value Ref Range   Suscept Result 1 Morganella morganii (A)    Antimicrobial Suscept Comment   Miscellaneous [facility] test (send-out)   Collection Time: 10/26/24  8:18 AM  Result Value Ref Range   [facility] test code [id number]    [facility] test name      Susceptibility Testing, Aerobic and Facultatively Anaerobic Organisms   Source ([facility]) URINE ORG    Misc [facility] result COMMENT    Lab Results  Component Value Date   HGBA1C 11.5 (H) 07/27/2024    Lab Results  Component Value Date   TSH 0.56 01/21/2023    Lab Results  Component Value Date   WBC 4.8 10/22/2024   HGB 12.7 10/22/2024   HCT 37.3 10/22/2024   MCV 87.6 10/22/2024   PLT 311 10/22/2024    Lab Results  Component Value Date   NA 137 10/22/2024   CL 102 10/22/2024   K 4.0 10/22/2024   CO2 20 (L) 10/22/2024   BUN 19 10/22/2024   CREATININE 0.74 10/22/2024   GFRNONAA >60 10/22/2024   CALCIUM  9.6 10/22/2024   PHOS 3.1 07/30/2024   ALBUMIN  4.5 10/22/2024   GLUCOSE 247 (H) 10/22/2024    Lab Results  Component Value Date   ALT 22 10/22/2024   AST 17 10/22/2024   ALKPHOS 36 (L) 10/22/2024   BILITOT 0.5 10/22/2024    Lab Results  Component Value Date   CHOL 175 03/14/2011   HDL 44.00 03/14/2011   LDLCALC 116 (H) 03/14/2011   TRIG 74.0 03/14/2011  CHOLHDL 4 03/14/2011    Assessment & Plan:  Flu shot declined  Problem List Items Addressed This Visit     UTI (urinary tract infection)   UCx grew Morganella morganii - treated with  cipro  (sensitive) with resolution of UTI symptoms.       Type 2 diabetes mellitus with other specified complication (HCC) - Primary   Fasting sugars remain high despite lantus  20u daily. Discussed continued slow titration by 2u every 2d to goal fasting sugar average <150.  She stopped metformin  IR 500mg  bid Agrees to retry metformin  XR 500mg  daily.  Avoid GLP1 with current GI symptoms. Caution with SGLT2i in recent UTI.  Insurance is not covering CGM despite regular insulin  use. Freestyle Libre 3 + sample provided today.  Will refer to endocrinologist in [place] for further assistance.  Update labwork today.       Relevant Medications   metFORMIN  (GLUCOPHAGE -XR) 500 MG 24 hr tablet   Other Relevant Orders   Lipid panel   Comprehensive metabolic panel with GFR   TSH   Hemoglobin A1c   Ambulatory referral to Endocrinology   Abdominal pain   Ongoing periumbilical discomfort with radiation to the back, associated with increased indigestion, gassiness, bloating, some better since she stopped several medicines including metformin , but persists.  Symptoms not typical of pancreatitis, and recent CT scan didn't show pancreas inflammation.  Update labwork today including lipase.       Relevant Orders   Comprehensive metabolic panel with GFR   TSH   CBC with Differential/Platelet   Lipase     Meds ordered this encounter  Medications   metFORMIN  (GLUCOPHAGE -XR) 500 MG 24 hr tablet    Sig: Take 1 tablet (500 mg total) by mouth daily with breakfast.    Dispense:  30 tablet    Refill:  3    To replace plain metformin     Orders Placed This Encounter  Procedures   Lipid panel   Comprehensive metabolic panel with GFR   TSH   Hemoglobin A1c   CBC with Differential/Platelet   Lipase   Ambulatory referral to Endocrinology    Referral Priority:   Routine    Referral Reason:   Specialty Services Required    Number of Visits Requested:   1    Patient Instructions  Labs today   Continue titrating lantus  by 2 units every 2 days if average fasting sugars staying >150.  Try extended release metformin  XR 500mg  one tablet daily.  I will refer you to diabetes doctor in [place].  Return in 1 month for follow up visit   Follow up plan: Return in about 1 month (around 12/01/2024) for follow up visit.  Anton Blas, MD

## 2024-11-01 NOTE — Patient Instructions (Addendum)
 Labs today  Continue titrating lantus  by 2 units every 2 days if average fasting sugars staying >150.  Try extended release metformin  XR 500mg  one tablet daily.  I will refer you to diabetes doctor in [place].  Return in 1 month for follow up visit

## 2024-11-02 ENCOUNTER — Encounter: Payer: Self-pay | Admitting: Family Medicine

## 2024-11-02 LAB — COMPREHENSIVE METABOLIC PANEL WITH GFR
ALT: 24 U/L (ref 0–35)
AST: 14 U/L (ref 0–37)
Albumin: 4.4 g/dL (ref 3.5–5.2)
Alkaline Phosphatase: 34 U/L — ABNORMAL LOW (ref 39–117)
BUN: 15 mg/dL (ref 6–23)
CO2: 27 meq/L (ref 19–32)
Calcium: 9.3 mg/dL (ref 8.4–10.5)
Chloride: 103 meq/L (ref 96–112)
Creatinine, Ser: 0.57 mg/dL (ref 0.40–1.20)
GFR: 110.12 mL/min (ref >60.00)
Glucose, Bld: 201 mg/dL — ABNORMAL HIGH (ref 70–99)
Potassium: 4.5 meq/L (ref 3.5–5.1)
Sodium: 138 meq/L (ref 135–145)
Total Bilirubin: 0.5 mg/dL (ref 0.2–1.2)
Total Protein: 7.4 g/dL (ref 6.0–8.3)

## 2024-11-02 LAB — CBC WITH DIFFERENTIAL/PLATELET
Basophils Absolute: 0 K/uL (ref 0.0–0.1)
Basophils Relative: 0.7 % (ref 0.0–3.0)
Eosinophils Absolute: 0.2 K/uL (ref 0.0–0.7)
Eosinophils Relative: 4.7 % (ref 0.0–5.0)
HCT: 40.2 % (ref 36.0–46.0)
Hemoglobin: 13.6 g/dL (ref 12.0–15.0)
Lymphocytes Relative: 27.5 % (ref 12.0–46.0)
Lymphs Abs: 1.4 K/uL (ref 0.7–4.0)
MCHC: 33.8 g/dL (ref 30.0–36.0)
MCV: 89.1 fl (ref 78.0–100.0)
Monocytes Absolute: 0.4 K/uL (ref 0.1–1.0)
Monocytes Relative: 7.8 % (ref 3.0–12.0)
Neutro Abs: 3 K/uL (ref 1.4–7.7)
Neutrophils Relative %: 59.3 % (ref 43.0–77.0)
Platelets: 339 K/uL (ref 150.0–400.0)
RBC: 4.51 Mil/uL (ref 3.87–5.11)
RDW: 12.7 % (ref 11.5–15.5)
WBC: 5.1 K/uL (ref 4.0–10.5)

## 2024-11-02 LAB — URINE CULTURE: Culture: >=100000 — AB

## 2024-11-02 LAB — LIPID PANEL
Cholesterol: 202 mg/dL — ABNORMAL HIGH (ref 0–200)
HDL: 50.1 mg/dL (ref >39.00)
LDL Cholesterol: 129 mg/dL — ABNORMAL HIGH (ref 0–99)
NonHDL: 151.98
Total CHOL/HDL Ratio: 4
Triglycerides: 115 mg/dL (ref 0.0–149.0)
VLDL: 23 mg/dL (ref 0.0–40.0)

## 2024-11-02 LAB — HEMOGLOBIN A1C: Hgb A1c MFr Bld: 10.9 % — ABNORMAL HIGH (ref 4.6–6.5)

## 2024-11-02 LAB — LIPASE: Lipase: 29 U/L (ref 11.0–59.0)

## 2024-11-02 NOTE — Assessment & Plan Note (Addendum)
 Fasting sugars remain high despite lantus  20u daily. Discussed continued slow titration by 2u every 2d to goal fasting sugar average <150.  She stopped metformin  IR 500mg  bid Agrees to retry metformin  XR 500mg  daily.  Avoid GLP1 with current GI symptoms. Caution with SGLT2i in recent UTI.  Insurance is not covering CGM despite regular insulin  use. Freestyle Libre 3 + sample provided today.  Will refer to endocrinologist in [place] for further assistance.  Update labwork today.

## 2024-11-02 NOTE — Assessment & Plan Note (Addendum)
 Ongoing periumbilical discomfort with radiation to the back, associated with increased indigestion, gassiness, bloating, some better since she stopped several medicines including metformin , but persists.  Symptoms not typical of pancreatitis, and recent CT scan didn't show pancreas inflammation.  Update labwork today including lipase.

## 2024-11-02 NOTE — Assessment & Plan Note (Signed)
 UCx grew Morganella morganii - treated with cipro  (sensitive) with resolution of UTI symptoms.

## 2024-11-03 LAB — TSH: TSH: 0.54 u[IU]/mL (ref 0.35–5.50)

## 2024-11-07 ENCOUNTER — Ambulatory Visit: Payer: Self-pay | Admitting: Family Medicine

## 2024-11-07 ENCOUNTER — Encounter: Payer: Self-pay | Admitting: Family Medicine

## 2024-11-30 ENCOUNTER — Ambulatory Visit: Admitting: Family Medicine

## 2025-01-02 ENCOUNTER — Ambulatory Visit: Admitting: Family Medicine
